# Patient Record
Sex: Male | Born: 1990 | Race: White | Hispanic: No | Marital: Married | Smoking: Never smoker
Health system: Southern US, Community
[De-identification: ages and names within clinical notes are randomized; demographics above are authoritative.]

## PROBLEM LIST (undated history)

## (undated) DIAGNOSIS — T7840XA Allergy, unspecified, initial encounter: Secondary | ICD-10-CM

## (undated) DIAGNOSIS — Z8639 Personal history of other endocrine, nutritional and metabolic disease: Secondary | ICD-10-CM

## (undated) DIAGNOSIS — T783XXA Angioneurotic edema, initial encounter: Secondary | ICD-10-CM

## (undated) HISTORY — PX: MENISCUS REPAIR: SHX5179

## (undated) HISTORY — DX: Allergy, unspecified, initial encounter: T78.40XA

## (undated) HISTORY — DX: Angioneurotic edema, initial encounter: T78.3XXA

## (undated) HISTORY — DX: Personal history of other endocrine, nutritional and metabolic disease: Z86.39

---
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
## 2013-12-27 ENCOUNTER — Encounter: Payer: Self-pay | Admitting: Family Medicine

## 2013-12-27 ENCOUNTER — Encounter (INDEPENDENT_AMBULATORY_CARE_PROVIDER_SITE_OTHER): Payer: Self-pay

## 2013-12-27 ENCOUNTER — Ambulatory Visit (HOSPITAL_COMMUNITY)
Admission: RE | Admit: 2013-12-27 | Discharge: 2013-12-27 | Disposition: A | Discharge status: Home / Self Care | Payer: BC Managed Care – PPO | Source: Ambulatory Visit | Attending: Family Medicine | Admitting: Family Medicine

## 2013-12-27 ENCOUNTER — Ambulatory Visit (INDEPENDENT_AMBULATORY_CARE_PROVIDER_SITE_OTHER): Payer: BC Managed Care – PPO

## 2013-12-27 ENCOUNTER — Ambulatory Visit (INDEPENDENT_AMBULATORY_CARE_PROVIDER_SITE_OTHER): Payer: BC Managed Care – PPO | Admitting: Family Medicine

## 2013-12-27 VITALS — BP 129/86 | HR 59 | Temp 98.4°F | Ht 70.0 in | Wt 240.0 lb

## 2013-12-27 DIAGNOSIS — R52 Pain, unspecified: Secondary | ICD-10-CM

## 2013-12-27 DIAGNOSIS — R109 Unspecified abdominal pain: Secondary | ICD-10-CM

## 2013-12-27 DIAGNOSIS — R1031 Right lower quadrant pain: Secondary | ICD-10-CM | POA: Insufficient documentation

## 2013-12-27 LAB — POCT CBC
Granulocyte percent: 53.3 %G (ref 37–80)
HEMATOCRIT: 45.1 % (ref 43.5–53.7)
Hemoglobin: 15 g/dL (ref 14.1–18.1)
Lymph, poc: 2.4 (ref 0.6–3.4)
MCH: 28.2 pg (ref 27–31.2)
MCHC: 33.2 g/dL (ref 31.8–35.4)
MCV: 85 fL (ref 80–97)
MPV: 8.4 fL (ref 0–99.8)
POC Granulocyte: 3 (ref 2–6.9)
POC LYMPH PERCENT: 42.5 %L (ref 10–50)
Platelet Count, POC: 229 10*3/uL (ref 142–424)
RBC: 5.3 M/uL (ref 4.69–6.13)
RDW, POC: 12.6 %
WBC: 5.6 10*3/uL (ref 4.6–10.2)

## 2013-12-27 LAB — POCT URINALYSIS DIPSTICK
Bilirubin, UA: NEGATIVE
GLUCOSE UA: NEGATIVE
KETONES UA: NEGATIVE
LEUKOCYTES UA: NEGATIVE
Nitrite, UA: NEGATIVE
PH UA: 5
Protein, UA: NEGATIVE
SPEC GRAV UA: 1.015
Urobilinogen, UA: NEGATIVE

## 2013-12-27 LAB — POCT UA - MICROSCOPIC ONLY
Bacteria, U Microscopic: NEGATIVE
CASTS, UR, LPF, POC: NEGATIVE
CRYSTALS, UR, HPF, POC: NEGATIVE
Mucus, UA: NEGATIVE
WBC, UR, HPF, POC: NEGATIVE
Yeast, UA: NEGATIVE

## 2013-12-27 MED ORDER — IOHEXOL 300 MG/ML  SOLN
100.0000 mL | Freq: Once | INTRAMUSCULAR | Status: AC | PRN
  Administered 2013-12-27: 100 mL via INTRAVENOUS

## 2013-12-27 MED ORDER — NAPROXEN 500 MG PO TABS: 500.0000 mg | ORAL_TABLET | Freq: Two times a day (BID) | ORAL | Status: DC

## 2013-12-27 NOTE — Patient Instructions (Signed)
Only drink clear liquids and avoid milk cheese caffeine and dairy product Avoid any heavy lifting Please do not leave x-ray until the results of the CT scan are call to [place]

## 2013-12-27 NOTE — Addendum Note (Signed)
Addended by: Zannie Cove on: 12/27/2013 02:47 PM   Modules accepted: Orders

## 2013-12-27 NOTE — Progress Notes (Signed)
Subjective:    Patient ID: Marco Reynolds, male    DOB: 08/09/1990, 23 y.o.   MRN: [id number]  HPI RUQ pain that began this morning. Denies nausea or vomiting. Has taken Pepto Bismol with no relief.         There are no active problems to display for this patient.  No outpatient encounter prescriptions on file as of 12/27/2013.    Review of Systems  Constitutional: Negative.   HENT: Negative.   Eyes: Negative.   Respiratory: Negative.   Cardiovascular: Negative.   Gastrointestinal: Positive for abdominal pain (RUQ ).  Endocrine: Negative.   Genitourinary: Negative.   Musculoskeletal: Negative.   Skin: Negative.   Allergic/Immunologic: Negative.   Neurological: Negative.   Hematological: Negative.   Psychiatric/Behavioral: Negative.        Objective:   Physical Exam  Nursing note and vitals reviewed. Constitutional: He is oriented to person, place, and time. He appears well-developed and well-nourished. No distress.  HENT:  Head: Normocephalic and atraumatic.  Eyes: Conjunctivae and EOM are normal. Pupils are equal, round, and reactive to light. Right eye exhibits no discharge. Left eye exhibits no discharge. No scleral icterus.  Neck: Normal range of motion. Neck supple. No thyromegaly present.  Cardiovascular: Normal rate, regular rhythm and normal heart sounds.  Exam reveals no gallop and no friction rub.   No murmur heard. At 72 per minute  Pulmonary/Chest: Effort normal and breath sounds normal. No respiratory distress. He has no wheezes. He has no rales. He exhibits no tenderness.  Abdominal: Soft. Bowel sounds are normal. He exhibits no mass. There is tenderness. There is guarding (there is slight guarding when pressing on the epigastrium and right upper quad). There is no rebound.  Normal bowel sounds with epigastric and right upper quadrant tenderness  Musculoskeletal: Normal range of motion. He exhibits no edema and no tenderness.  The patient noted  some pain with going supine on the table  Lymphadenopathy:    He has no cervical adenopathy.  Neurological: He is alert and oriented to person, place, and time. No cranial nerve deficit.  Skin: Skin is warm and dry. No rash noted.  Psychiatric: He has a normal mood and affect. His behavior is normal. Judgment and thought content normal.   BP 129/86  Pulse 59  Temp(Src) 98.4 F (36.9 C) (Oral)  Ht _0  (1.778 m)  Wt 240 lb (108.863 kg)  BMI 34.44 kg/m2  Results for orders placed in visit on 12/27/13  POCT CBC      Result Value Ref Range   WBC 5.6  4.6 - 10.2 K/uL   Lymph, poc 2.4  0.6 - 3.4   POC LYMPH PERCENT 42.5  10 - 50 %L   POC Granulocyte 3.0  2 - 6.9   Granulocyte percent 53.3  37 - 80 %G   RBC 5.3  4.69 - 6.13 M/uL   Hemoglobin 15.0  14.1 - 18.1 g/dL   HCT, POC 45.1  43.5 - 53.7 %   MCV 85.0  80 - 97 fL   MCH, POC 28.2  27 - 31.2 pg   MCHC 33.2  31.8 - 35.4 g/dL   RDW, POC 12.6     Platelet Count, POC 229.0  142 - 424 K/uL   MPV 8.4  0 - 99.8 fL  POCT URINALYSIS DIPSTICK      Result Value Ref Range   Color, UA yellow     Clarity, UA clear  Glucose, UA neg     Bilirubin, UA neg     Ketones, UA neg     Spec Grav, UA 1.015     Blood, UA trace     pH, UA 5.0     Protein, UA neg     Urobilinogen, UA negative     Nitrite, UA neg     Leukocytes, UA Negative    POCT UA - MICROSCOPIC ONLY      Result Value Ref Range   WBC, Ur, HPF, POC neg     RBC, urine, microscopic rare     Bacteria, U Microscopic neg     Mucus, UA neg     Epithelial cells, urine per micros rare     Crystals, Ur, HPF, POC neg     Casts, Ur, LPF, POC neg     Yeast, UA neg      These results were reviewed with the patient before he left to go get the CT scan of his abdomen       Assessment & Plan:  1. Abdominal pain, acute - POCT CBC - POCT urinalysis dipstick - POCT UA - Microscopic Only - DG Abd 1 View; Future - BMP8+EGFR - Hepatic function panel - CT Abdomen Pelvis W  Contrast; Future  Patient Instructions  Only drink clear liquids and avoid milk cheese caffeine and dairy product Avoid any heavy lifting Please do not leave x-ray until the results of the CT scan are call to [place]   Arrie Senate MD

## 2013-12-28 LAB — BMP8+EGFR
BUN/Creatinine Ratio: 11 (ref 8–19)
BUN: 10 mg/dL (ref 6–20)
CHLORIDE: 97 mmol/L (ref 97–108)
CO2: 24 mmol/L (ref 18–29)
CREATININE: 0.87 mg/dL (ref 0.76–1.27)
Calcium: 9.9 mg/dL (ref 8.7–10.2)
GFR calc non Af Amer: 121 mL/min/{1.73_m2} (ref >59)
GFR, EST AFRICAN AMERICAN: 140 mL/min/{1.73_m2} (ref >59)
GLUCOSE: 96 mg/dL (ref 65–99)
Potassium: 4.8 mmol/L (ref 3.5–5.2)
Sodium: 138 mmol/L (ref 134–144)

## 2013-12-28 LAB — HEPATIC FUNCTION PANEL
ALBUMIN: 4.9 g/dL (ref 3.5–5.5)
ALK PHOS: 69 IU/L (ref 39–117)
ALT: 25 IU/L (ref 0–44)
AST: 22 IU/L (ref 0–40)
BILIRUBIN DIRECT: 0.09 mg/dL (ref 0.00–0.40)
BILIRUBIN TOTAL: 0.4 mg/dL (ref 0.0–1.2)
TOTAL PROTEIN: 7.2 g/dL (ref 6.0–8.5)

## 2013-12-29 ENCOUNTER — Telehealth: Payer: Self-pay | Admitting: Family Medicine

## 2013-12-29 NOTE — Telephone Encounter (Signed)
He did get started on the medication and is doing well.

## 2013-12-29 NOTE — Telephone Encounter (Signed)
Message copied by Waverly Ferrari on Wed Dec 29, 2013  9:26 AM ------      Message from: Zannie Cove      Created: Tue Dec 28, 2013 12:31 PM                   ----- Message -----         From: Chipper Herb, MD         Sent: 12/28/2013   7:21 AM           To: Cameron Proud Bullins, LPN            The blood sugar, kidney function tests, and electrolytes are within normal limit      All liver function tests are within normal      Please make sure the patient got started on medication and find out how he is doing this morning. ------

## 2014-07-14 ENCOUNTER — Encounter (INDEPENDENT_AMBULATORY_CARE_PROVIDER_SITE_OTHER): Payer: Self-pay

## 2014-07-14 ENCOUNTER — Encounter: Payer: Self-pay | Admitting: Nurse Practitioner

## 2014-07-14 ENCOUNTER — Ambulatory Visit (INDEPENDENT_AMBULATORY_CARE_PROVIDER_SITE_OTHER): Payer: BLUE CROSS/BLUE SHIELD | Admitting: Nurse Practitioner

## 2014-07-14 VITALS — BP 138/87 | HR 77 | Temp 97.4°F | Ht 70.0 in | Wt 213.0 lb

## 2014-07-14 DIAGNOSIS — Z0000 Encounter for general adult medical examination without abnormal findings: Secondary | ICD-10-CM | POA: Diagnosis not present

## 2014-07-14 DIAGNOSIS — Z23 Encounter for immunization: Secondary | ICD-10-CM | POA: Diagnosis not present

## 2014-07-14 DIAGNOSIS — Z114 Encounter for screening for human immunodeficiency virus [HIV]: Secondary | ICD-10-CM | POA: Diagnosis not present

## 2014-07-14 LAB — POCT CBC
GRANULOCYTE PERCENT: 41.5 % (ref 37–80)
HEMATOCRIT: 45.8 % (ref 43.5–53.7)
HEMOGLOBIN: 14.7 g/dL (ref 14.1–18.1)
LYMPH, POC: 2.5 (ref 0.6–3.4)
MCH, POC: 28.1 pg (ref 27–31.2)
MCHC: 32.2 g/dL (ref 31.8–35.4)
MCV: 87.3 fL (ref 80–97)
MPV: 8.6 fL (ref 0–99.8)
POC GRANULOCYTE: 2.1 (ref 2–6.9)
POC LYMPH PERCENT: 50 %L (ref 10–50)
Platelet Count, POC: 243 10*3/uL (ref 142–424)
RBC: 5.25 M/uL (ref 4.69–6.13)
RDW, POC: 12.9 %
WBC: 5 10*3/uL (ref 4.6–10.2)

## 2014-07-14 NOTE — Progress Notes (Signed)
   Subjective:    Patient ID: Marco Reynolds, male    DOB: 1990/10/16, 24 y.o.   MRN: [id number]  HPI  Patient in today for complete physical exam- he is doing well today without complaints. He is currently on no meds.   Review of Systems  Constitutional: Negative.   HENT: Negative.   Respiratory: Negative.   Cardiovascular: Negative.   Gastrointestinal: Negative.   Genitourinary: Negative.   Neurological: Negative.   Psychiatric/Behavioral: Negative.   All other systems reviewed and are negative.      Objective:   Physical Exam  Constitutional: He is oriented to person, place, and time. He appears well-developed and well-nourished.  HENT:  Head: Normocephalic.  Right Ear: External ear normal.  Left Ear: External ear normal.  Nose: Nose normal.  Mouth/Throat: Oropharynx is clear and moist.  Eyes: EOM are normal. Pupils are equal, round, and reactive to light.  Neck: Normal range of motion. Neck supple. No JVD present. No thyromegaly present.  Cardiovascular: Normal rate, regular rhythm, normal heart sounds and intact distal pulses.  Exam reveals no gallop and no friction rub.   No murmur heard. Pulmonary/Chest: Effort normal and breath sounds normal. No respiratory distress. He has no wheezes. He has no rales. He exhibits no tenderness.  Abdominal: Soft. Bowel sounds are normal. He exhibits no mass. There is no tenderness.  Musculoskeletal: Normal range of motion. He exhibits no edema.  Lymphadenopathy:    He has no cervical adenopathy.  Neurological: He is alert and oriented to person, place, and time. No cranial nerve deficit.  Skin: Skin is warm and dry.  Psychiatric: He has a normal mood and affect. His behavior is normal. Judgment and thought content normal.   BP 138/87 mmHg  Pulse 77  Temp(Src) 97.4 F (36.3 C) (Oral)  Ht $R'5\' 10"'uS$  (1.778 m)  Wt 213 lb (96.616 kg)  BMI 30.56 kg/m2        Assessment & Plan:  1. Annual physical exam - POCT CBC -  CMP14+EGFR - NMR, lipoprofile  2. Screening for human immunodeficiency virus - HIV antibody  Labs pending Low fat diet and exercise RTO prn  Mary-Margaret Hassell Done, FNP

## 2014-07-14 NOTE — Patient Instructions (Signed)
Exercise to Stay Healthy Exercise helps you become and stay healthy. EXERCISE IDEAS AND TIPS Choose exercises that:  You enjoy.  Fit into your day. You do not need to exercise really hard to be healthy. You can do exercises at a slow or medium level and stay healthy. You can:  Stretch before and after working out.  Try yoga, Pilates, or tai chi.  Lift weights.  Walk fast, swim, jog, run, climb stairs, bicycle, dance, or rollerskate.  Take aerobic classes. Exercises that burn about 150 calories:  Running 1  miles in 15 minutes.  Playing volleyball for 45 to 60 minutes.  Washing and waxing a car for 45 to 60 minutes.  Playing touch football for 45 minutes.  Walking 1  miles in 35 minutes.  Pushing a stroller 1  miles in 30 minutes.  Playing basketball for 30 minutes.  Raking leaves for 30 minutes.  Bicycling 5 miles in 30 minutes.  Walking 2 miles in 30 minutes.  Dancing for 30 minutes.  Shoveling snow for 15 minutes.  Swimming laps for 20 minutes.  Walking up stairs for 15 minutes.  Bicycling 4 miles in 15 minutes.  Gardening for 30 to 45 minutes.  Jumping rope for 15 minutes.  Washing windows or floors for 45 to 60 minutes. Document Released: 05/18/2010 Document Revised: 07/08/2011 Document Reviewed: 05/18/2010 [employer] Patient Information 2015 [employer]. This information is not intended to replace advice given to you by your health care provider. Make sure you discuss any questions you have with your health care provider.  

## 2014-07-14 NOTE — Addendum Note (Signed)
Addended by: Earlene Plater on: 07/14/2014 04:50 PM   Modules accepted: Miquel Dunn

## 2014-07-14 NOTE — Addendum Note (Signed)
Addended by: Rolena Infante on: 07/14/2014 04:50 PM   Modules accepted: Orders

## 2014-07-15 ENCOUNTER — Ambulatory Visit: Payer: BLUE CROSS/BLUE SHIELD | Admitting: Nurse Practitioner

## 2014-07-18 LAB — NMR, LIPOPROFILE
Cholesterol: 191 mg/dL (ref 100–199)
HDL CHOLESTEROL BY NMR: 38 mg/dL — AB (ref >39)
HDL PARTICLE NUMBER: 26.1 umol/L — AB (ref >=30.5)
LDL Particle Number: 1583 nmol/L — ABNORMAL HIGH (ref <1000)
LDL Size: 20.9 nm (ref >20.5)
LDL-C: 127 mg/dL — AB (ref 0–99)
LP-IR Score: 69 — ABNORMAL HIGH (ref <=45)
SMALL LDL PARTICLE NUMBER: 570 nmol/L — AB (ref <=527)
TRIGLYCERIDES BY NMR: 132 mg/dL (ref 0–149)

## 2014-07-18 LAB — CMP14+EGFR

## 2014-07-18 LAB — HIV ANTIBODY (ROUTINE TESTING W REFLEX): HIV Screen 4th Generation wRfx: NONREACTIVE

## 2014-07-19 ENCOUNTER — Other Ambulatory Visit: Payer: BLUE CROSS/BLUE SHIELD

## 2014-07-19 ENCOUNTER — Telehealth: Payer: Self-pay | Admitting: Nurse Practitioner

## 2014-07-19 DIAGNOSIS — Z0000 Encounter for general adult medical examination without abnormal findings: Secondary | ICD-10-CM

## 2014-07-19 NOTE — Telephone Encounter (Signed)
Spoke with patient.

## 2014-07-19 NOTE — Progress Notes (Signed)
Lab only 

## 2014-09-04 LAB — CMP14+EGFR
ALK PHOS: 89 IU/L (ref 39–117)
ALT: 19 IU/L (ref 0–44)
AST: 20 IU/L (ref 0–40)
Albumin/Globulin Ratio: 2.2 (ref 1.1–2.5)
Albumin: 5 g/dL (ref 3.5–5.5)
BUN / CREAT RATIO: 16 (ref 8–19)
BUN: 14 mg/dL (ref 6–20)
CHLORIDE: 97 mmol/L (ref 97–108)
CO2: 25 mmol/L (ref 18–29)
CREATININE: 0.88 mg/dL (ref 0.76–1.27)
Calcium: 9.6 mg/dL (ref 8.7–10.2)
GFR, EST AFRICAN AMERICAN: 138 mL/min/{1.73_m2} (ref >59)
GFR, EST NON AFRICAN AMERICAN: 119 mL/min/{1.73_m2} (ref >59)
GLOBULIN, TOTAL: 2.3 g/dL (ref 1.5–4.5)
Glucose: 93 mg/dL (ref 65–99)
Potassium: 4.2 mmol/L (ref 3.5–5.2)
SODIUM: 139 mmol/L (ref 134–144)
Total Protein: 7.3 g/dL (ref 6.0–8.5)

## 2015-05-02 ENCOUNTER — Telehealth: Payer: Self-pay | Admitting: Nurse Practitioner

## 2015-05-02 DIAGNOSIS — J029 Acute pharyngitis, unspecified: Secondary | ICD-10-CM

## 2015-05-02 MED ORDER — AMOXICILLIN-POT CLAVULANATE 875-125 MG PO TABS: 1.0000 | ORAL_TABLET | Freq: Two times a day (BID) | ORAL | Status: DC

## 2015-05-02 NOTE — Progress Notes (Signed)
We are sorry that you are not feeling well.  Here is how we plan to help!  Based on what you have shared with me it looks like you have Pharyngitis.  pharyngitis is inflammation and infection in the Throat.  Based on your presentation I believe you most likely have Acute Bacterial Sinusitis.  This is an infection caused by bacteria and is treated with antibiotics. I have prescribed Augmentin 875mg /125mg  one tablet twice daily with food, for 7 days. You may use an oral decongestant such as Mucinex D or if you have glaucoma or high blood pressure use plain Mucinex. Saline nasal spray help and can safely be used as often as needed for congestion.  If you develop worsening sinus pain, fever or notice severe headache and vision changes, or if symptoms are not better after completion of antibiotic, please schedule an appointment with a health care provider.    Throat infections are not as easily transmitted as other respiratory infection, however we still recommend that you avoid close contact with loved ones, especially the very young and elderly.  Remember to wash your hands thoroughly throughout the day as this is the number one way to prevent the spread of infection!  Home Care:  Only take medications as instructed by your medical team.  Complete the entire course of an antibiotic.  Do not take these medications with alcohol.  A steam or ultrasonic humidifier can help congestion.  You can place a towel over your head and breathe in the steam from hot water coming from a faucet.  Avoid close contacts especially the very young and the elderly.  Cover your mouth when you cough or sneeze.  Always remember to wash your hands.  Get Help Right Away If:  You develop worsening fever or sinus pain.  You develop a severe head ache or visual changes.  Your symptoms persist after you have completed your treatment plan.  Make sure you  Understand these instructions.  Will watch your  condition.  Will get help right away if you are not doing well or get worse.  Your e-visit answers were reviewed by a board certified advanced clinical practitioner to complete your personal care plan.  Depending on the condition, your plan could have included both over the counter or prescription medications.  If there is a problem please reply  once you have received a response from your provider.  Your safety is important to [place].  If you have drug allergies check your prescription carefully.    You can use MyChart to ask questions about today's visit, request a non-urgent call back, or ask for a work or school excuse for 24 hours related to this e-Visit. If it has been greater than 24 hours you will need to follow up with your provider, or enter a new e-Visit to address those concerns.  You will get an e-mail in the next two days asking about your experience.  I hope that your e-visit has been valuable and will speed your recovery. Thank you for using e-visits.

## 2015-05-17 ENCOUNTER — Ambulatory Visit: Payer: BLUE CROSS/BLUE SHIELD

## 2015-05-19 ENCOUNTER — Encounter: Payer: Self-pay | Admitting: Nurse Practitioner

## 2015-05-19 ENCOUNTER — Ambulatory Visit (INDEPENDENT_AMBULATORY_CARE_PROVIDER_SITE_OTHER): Payer: BLUE CROSS/BLUE SHIELD | Admitting: Nurse Practitioner

## 2015-05-19 VITALS — BP 132/82 | HR 89 | Temp 98.1°F | Ht 70.0 in | Wt 226.0 lb

## 2015-05-19 DIAGNOSIS — J029 Acute pharyngitis, unspecified: Secondary | ICD-10-CM | POA: Diagnosis not present

## 2015-05-19 MED ORDER — METHYLPREDNISOLONE ACETATE 80 MG/ML IJ SUSP
80.0000 mg | Freq: Once | INTRAMUSCULAR | Status: AC
  Administered 2015-05-19: 80 mg via INTRAMUSCULAR

## 2015-05-19 NOTE — Progress Notes (Signed)
   Subjective:    Patient ID: Marco Reynolds, male    DOB: May 30, 1990, 25 y.o.   MRN: [id number]  HPI Patient in c/o sore throat , spots on tonsils and ear popping- he did an evisit on January 3rd and was given amoxicillin- he really does  Not feel any better.     Review of Systems  Constitutional: Negative.   HENT: Negative.   Respiratory: Negative.   Cardiovascular: Negative.   Genitourinary: Negative.   Neurological: Negative.   Psychiatric/Behavioral: Negative.   All other systems reviewed and are negative.      Objective:   Physical Exam  Constitutional: He is oriented to person, place, and time. He appears well-developed and well-nourished.  HENT:  Right Ear: Tympanic membrane is not erythematous. No middle ear effusion.  Left Ear: Tympanic membrane is not erythematous. A middle ear effusion is present.  Nose: Mucosal edema and rhinorrhea present. Right sinus exhibits no maxillary sinus tenderness and no frontal sinus tenderness. Left sinus exhibits no maxillary sinus tenderness and no frontal sinus tenderness.  Mouth/Throat: Uvula is midline. Posterior oropharyngeal edema present.  Neck: Normal range of motion.  Cardiovascular: Normal rate, regular rhythm and normal heart sounds.   Pulmonary/Chest: Effort normal and breath sounds normal.  Abdominal: Soft.  Neurological: He is alert and oriented to person, place, and time.  Skin: Skin is warm.  Psychiatric: He has a normal mood and affect. His behavior is normal. Judgment and thought content normal.   BP 132/82 mmHg  Pulse 89  Temp(Src) 98.1 F (36.7 C) (Oral)  Ht 5\' 10"  (1.778 m)  Wt 226 lb (102.513 kg)  BMI 32.43 kg/m2         Assessment & Plan:   1. Acute pharyngitis, unspecified etiology    Meds ordered this encounter  Medications  . methylPREDNISolone acetate (DEPO-MEDROL) injection 80 mg    Sig:    Force fluids Motrin or tylenol OTC OTC decongestant Throat lozenges if help New toothbrush  in 3 days  Mary-Margaret Hassell Done, FNP

## 2015-05-19 NOTE — Patient Instructions (Signed)

## 2015-06-13 ENCOUNTER — Encounter: Payer: Self-pay | Admitting: Nurse Practitioner

## 2015-07-21 ENCOUNTER — Encounter: Payer: Self-pay | Admitting: Nurse Practitioner

## 2015-07-21 ENCOUNTER — Ambulatory Visit (INDEPENDENT_AMBULATORY_CARE_PROVIDER_SITE_OTHER): Payer: BLUE CROSS/BLUE SHIELD | Admitting: Nurse Practitioner

## 2015-07-21 VITALS — BP 138/79 | HR 93 | Temp 98.3°F | Ht 70.0 in | Wt 217.2 lb

## 2015-07-21 DIAGNOSIS — Z0000 Encounter for general adult medical examination without abnormal findings: Secondary | ICD-10-CM | POA: Diagnosis not present

## 2015-07-21 NOTE — Progress Notes (Signed)
   Subjective:    Patient ID: Marco Reynolds, male    DOB: [birth year], 25 y.o.   MRN: [id number]  HPI Patient here for annual physical exam. Patient doing well-has no complaints to day- on no meds.    Review of Systems  Constitutional: Negative.   HENT: Negative.   Respiratory: Negative.   Cardiovascular: Negative.   Genitourinary: Negative.   Musculoskeletal: Negative.   Neurological: Negative.   Psychiatric/Behavioral: Negative.   All other systems reviewed and are negative.      Objective:   Physical Exam  Constitutional: He is oriented to person, place, and time. He appears well-developed and well-nourished.  HENT:  Head: Normocephalic.  Right Ear: External ear normal.  Left Ear: External ear normal.  Nose: Nose normal.  Mouth/Throat: Oropharynx is clear and moist.  Eyes: EOM are normal. Pupils are equal, round, and reactive to light.  Neck: Normal range of motion. Neck supple. No JVD present. No thyromegaly present.  Cardiovascular: Normal rate, regular rhythm, normal heart sounds and intact distal pulses.  Exam reveals no gallop and no friction rub.   No murmur heard. Pulmonary/Chest: Effort normal and breath sounds normal. No respiratory distress. He has no wheezes. He has no rales. He exhibits no tenderness.  Abdominal: Soft. Bowel sounds are normal. He exhibits no mass. There is no tenderness.  Genitourinary: Prostate normal and penis normal.  Musculoskeletal: Normal range of motion. He exhibits no edema.  Lymphadenopathy:    He has no cervical adenopathy.  Neurological: He is alert and oriented to person, place, and time. No cranial nerve deficit.  Skin: Skin is warm and dry.  Psychiatric: He has a normal mood and affect. His behavior is normal. Judgment and thought content normal.    BP 138/79 mmHg  Pulse 93  Temp(Src) 98.3 F (36.8 C) (Oral)  Ht '5\' 10"'$  (1.778 m)  Wt 217 lb 3.2 oz (98.521 kg)  BMI 31.16 kg/m2       Assessment & Plan:  1. Annual  physical exam - CMP14+EGFR - Lipid panel    Labs pending Health maintenance reviewed Diet and exercise encouraged Continue all meds Follow up  In 1 year   Mary-Margaret Hassell Done, FNP

## 2015-07-21 NOTE — Patient Instructions (Signed)

## 2015-07-22 LAB — LIPID PANEL
CHOL/HDL RATIO: 4.7 ratio (ref 0.0–5.0)
Cholesterol, Total: 184 mg/dL (ref 100–199)
HDL: 39 mg/dL — ABNORMAL LOW (ref >39)
LDL CALC: 125 mg/dL — AB (ref 0–99)
TRIGLYCERIDES: 101 mg/dL (ref 0–149)
VLDL Cholesterol Cal: 20 mg/dL (ref 5–40)

## 2015-07-22 LAB — CMP14+EGFR
A/G RATIO: 2.1 (ref 1.2–2.2)
ALBUMIN: 4.6 g/dL (ref 3.5–5.5)
ALT: 23 IU/L (ref 0–44)
AST: 20 IU/L (ref 0–40)
Alkaline Phosphatase: 69 IU/L (ref 39–117)
BUN / CREAT RATIO: 11 (ref 8–19)
BUN: 11 mg/dL (ref 6–20)
Bilirubin Total: 0.3 mg/dL (ref 0.0–1.2)
CALCIUM: 9.6 mg/dL (ref 8.7–10.2)
CO2: 24 mmol/L (ref 18–29)
CREATININE: 0.97 mg/dL (ref 0.76–1.27)
Chloride: 98 mmol/L (ref 96–106)
GFR calc Af Amer: 124 mL/min/{1.73_m2} (ref >59)
GFR, EST NON AFRICAN AMERICAN: 107 mL/min/{1.73_m2} (ref >59)
GLOBULIN, TOTAL: 2.2 g/dL (ref 1.5–4.5)
Glucose: 101 mg/dL — ABNORMAL HIGH (ref 65–99)
POTASSIUM: 4.4 mmol/L (ref 3.5–5.2)
SODIUM: 142 mmol/L (ref 134–144)
Total Protein: 6.8 g/dL (ref 6.0–8.5)

## 2015-09-17 ENCOUNTER — Encounter: Payer: Self-pay | Admitting: Nurse Practitioner

## 2015-09-20 ENCOUNTER — Encounter: Payer: Self-pay | Admitting: Nurse Practitioner

## 2015-09-20 ENCOUNTER — Ambulatory Visit (INDEPENDENT_AMBULATORY_CARE_PROVIDER_SITE_OTHER): Payer: BLUE CROSS/BLUE SHIELD | Admitting: Nurse Practitioner

## 2015-09-20 ENCOUNTER — Ambulatory Visit (INDEPENDENT_AMBULATORY_CARE_PROVIDER_SITE_OTHER): Payer: BLUE CROSS/BLUE SHIELD

## 2015-09-20 VITALS — BP 128/88 | HR 69 | Temp 97.4°F | Ht 70.0 in | Wt 211.0 lb

## 2015-09-20 DIAGNOSIS — S39011A Strain of muscle, fascia and tendon of abdomen, initial encounter: Secondary | ICD-10-CM | POA: Diagnosis not present

## 2015-09-20 DIAGNOSIS — D229 Melanocytic nevi, unspecified: Secondary | ICD-10-CM | POA: Diagnosis not present

## 2015-09-20 DIAGNOSIS — R1012 Left upper quadrant pain: Secondary | ICD-10-CM

## 2015-09-20 DIAGNOSIS — K5900 Constipation, unspecified: Secondary | ICD-10-CM | POA: Diagnosis not present

## 2015-09-20 NOTE — Progress Notes (Addendum)
   Subjective:    Patient ID: Marco Reynolds, male    DOB: Sep 18, 1990, 25 y.o.   MRN: [id number]  HPI Patient in today c/o abdominal pain-started 2-3 weeks ago- said that he sneezed and felt immediate pain in left upper quadrant. Was ore for a few days then almost went away but has gradually come back- describes as a dull ache that is aggravated throughout day but cannot pick a certain thing that causes it to hurt. He played golf over weekend and it did not bother him at all. Rates pain 3/10 currently.   Review of Systems  Constitutional: Negative.   HENT: Negative.   Respiratory: Negative.   Cardiovascular: Negative.   Gastrointestinal: Positive for abdominal pain. Negative for nausea, vomiting, diarrhea and constipation.  Genitourinary: Negative.   Skin: Negative.   Neurological: Negative.   Psychiatric/Behavioral: Negative.   All other systems reviewed and are negative.      Objective:   Physical Exam  Constitutional: He appears well-developed and well-nourished. No distress.  Cardiovascular: Normal rate, regular rhythm and normal heart sounds.   Pulmonary/Chest: Effort normal and breath sounds normal.  Abdominal: Soft. Bowel sounds are normal. He exhibits no distension. There is tenderness (veery mild pain on deep palpitation left upper quadrant).  Neurological: He is alert.  Skin: Skin is warm.  Psychiatric: He has a normal mood and affect. His behavior is normal. Judgment and thought content normal.   BP 128/88 mmHg  Pulse 69  Temp(Src) 97.4 F (36.3 C) (Oral)  Ht 5\' 10"  (1.778 m)  Wt 211 lb (95.709 kg)  BMI 30.28 kg/m2  KUB- large stool burden and gas in colon- mostly in ascending and tranverse colon-Preliminary reading by Ronnald Collum, FNP  [facility]        Assessment & Plan:  1. Abdominal pain, left upper quadrant - DG Abd 1 View; Future  2. Constipation, unspecified constipation type Force fluids Increase fiber in diet  3. Abdominal muscle strain, initial  encounter Rest Splint area when sneezing or coughing RTO prn  4. Numerous moles - referral to dermatology   [place], FNP

## 2015-09-20 NOTE — Addendum Note (Signed)
Addended by: Chevis Pretty on: 09/20/2015 04:28 PM   Modules accepted: Orders

## 2015-09-20 NOTE — Patient Instructions (Signed)

## 2015-09-21 ENCOUNTER — Telehealth: Payer: Self-pay | Admitting: Nurse Practitioner

## 2015-10-03 DIAGNOSIS — L814 Other melanin hyperpigmentation: Secondary | ICD-10-CM | POA: Diagnosis not present

## 2015-10-03 DIAGNOSIS — D239 Other benign neoplasm of skin, unspecified: Secondary | ICD-10-CM | POA: Diagnosis not present

## 2015-10-03 DIAGNOSIS — D229 Melanocytic nevi, unspecified: Secondary | ICD-10-CM | POA: Diagnosis not present

## 2015-10-16 DIAGNOSIS — Z008 Encounter for other general examination: Secondary | ICD-10-CM | POA: Diagnosis not present

## 2015-10-16 DIAGNOSIS — Z719 Counseling, unspecified: Secondary | ICD-10-CM | POA: Diagnosis not present

## 2016-02-08 DIAGNOSIS — Z23 Encounter for immunization: Secondary | ICD-10-CM | POA: Diagnosis not present

## 2016-02-21 DIAGNOSIS — Z008 Encounter for other general examination: Secondary | ICD-10-CM | POA: Diagnosis not present

## 2016-02-21 DIAGNOSIS — Z139 Encounter for screening, unspecified: Secondary | ICD-10-CM | POA: Diagnosis not present

## 2016-02-21 DIAGNOSIS — E782 Mixed hyperlipidemia: Secondary | ICD-10-CM | POA: Diagnosis not present

## 2016-05-14 ENCOUNTER — Encounter: Payer: Self-pay | Admitting: Nurse Practitioner

## 2016-05-29 DIAGNOSIS — E782 Mixed hyperlipidemia: Secondary | ICD-10-CM | POA: Diagnosis not present

## 2016-05-29 DIAGNOSIS — Z139 Encounter for screening, unspecified: Secondary | ICD-10-CM | POA: Diagnosis not present

## 2016-06-03 DIAGNOSIS — Z1389 Encounter for screening for other disorder: Secondary | ICD-10-CM | POA: Diagnosis not present

## 2016-06-03 DIAGNOSIS — E663 Overweight: Secondary | ICD-10-CM | POA: Diagnosis not present

## 2016-06-03 DIAGNOSIS — Z008 Encounter for other general examination: Secondary | ICD-10-CM | POA: Diagnosis not present

## 2016-06-03 DIAGNOSIS — E782 Mixed hyperlipidemia: Secondary | ICD-10-CM | POA: Diagnosis not present

## 2016-07-16 DIAGNOSIS — Z113 Encounter for screening for infections with a predominantly sexual mode of transmission: Secondary | ICD-10-CM | POA: Diagnosis not present

## 2016-07-16 DIAGNOSIS — Z114 Encounter for screening for human immunodeficiency virus [HIV]: Secondary | ICD-10-CM | POA: Diagnosis not present

## 2016-07-30 ENCOUNTER — Encounter: Payer: BLUE CROSS/BLUE SHIELD | Admitting: Nurse Practitioner

## 2016-08-12 ENCOUNTER — Ambulatory Visit: Payer: BLUE CROSS/BLUE SHIELD | Admitting: Nurse Practitioner

## 2016-08-12 DIAGNOSIS — R1011 Right upper quadrant pain: Secondary | ICD-10-CM | POA: Diagnosis not present

## 2016-08-13 ENCOUNTER — Ambulatory Visit (INDEPENDENT_AMBULATORY_CARE_PROVIDER_SITE_OTHER): Payer: BLUE CROSS/BLUE SHIELD | Admitting: Nurse Practitioner

## 2016-08-13 ENCOUNTER — Encounter: Payer: Self-pay | Admitting: Nurse Practitioner

## 2016-08-13 VITALS — BP 125/88 | HR 61 | Temp 97.1°F | Ht 70.0 in | Wt 204.0 lb

## 2016-08-13 DIAGNOSIS — Z0000 Encounter for general adult medical examination without abnormal findings: Secondary | ICD-10-CM

## 2016-08-13 LAB — CMP14+EGFR
A/G RATIO: 2 (ref 1.2–2.2)
ALBUMIN: 4.8 g/dL (ref 3.5–5.5)
ALT: 25 IU/L (ref 0–44)
AST: 25 IU/L (ref 0–40)
Alkaline Phosphatase: 69 IU/L (ref 39–117)
BILIRUBIN TOTAL: 0.4 mg/dL (ref 0.0–1.2)
BUN / CREAT RATIO: 8 — AB (ref 9–20)
BUN: 8 mg/dL (ref 6–20)
CHLORIDE: 98 mmol/L (ref 96–106)
CO2: 26 mmol/L (ref 18–29)
Calcium: 9.8 mg/dL (ref 8.7–10.2)
Creatinine, Ser: 1.01 mg/dL (ref 0.76–1.27)
GFR calc non Af Amer: 101 mL/min/{1.73_m2} (ref >59)
GFR, EST AFRICAN AMERICAN: 117 mL/min/{1.73_m2} (ref >59)
GLOBULIN, TOTAL: 2.4 g/dL (ref 1.5–4.5)
Glucose: 89 mg/dL (ref 65–99)
POTASSIUM: 4.6 mmol/L (ref 3.5–5.2)
Sodium: 142 mmol/L (ref 134–144)
TOTAL PROTEIN: 7.2 g/dL (ref 6.0–8.5)

## 2016-08-13 LAB — LIPID PANEL
Chol/HDL Ratio: 4 ratio (ref 0.0–5.0)
Cholesterol, Total: 157 mg/dL (ref 100–199)
HDL: 39 mg/dL — ABNORMAL LOW (ref >39)
LDL Calculated: 88 mg/dL (ref 0–99)
Triglycerides: 148 mg/dL (ref 0–149)
VLDL Cholesterol Cal: 30 mg/dL (ref 5–40)

## 2016-08-13 NOTE — Patient Instructions (Signed)
 Health Maintenance, Male A healthy lifestyle and preventive care is important for your health and wellness. Ask your health care provider about what schedule of regular examinations is right for you. What should I know about weight and diet?  Eat a Healthy Diet  Eat plenty of vegetables, fruits, whole grains, low-fat dairy products, and lean protein.  Do not eat a lot of foods high in solid fats, added sugars, or salt. Maintain a Healthy Weight  Regular exercise can help you achieve or maintain a healthy weight. You should:  Do at least 150 minutes of exercise each week. The exercise should increase your heart rate and make you sweat (moderate-intensity exercise).  Do strength-training exercises at least twice a week. Watch Your Levels of Cholesterol and Blood Lipids  Have your blood tested for lipids and cholesterol every 5 years starting at 28 years of age. If you are at high risk for heart disease, you should start having your blood tested when you are 28 years old. You may need to have your cholesterol levels checked more often if:  Your lipid or cholesterol levels are high.  You are older than 28 years of age.  You are at high risk for heart disease. What should I know about cancer screening? Many types of cancers can be detected early and may often be prevented. Lung Cancer  You should be screened every year for lung cancer if:  You are a current smoker who has smoked for at least 30 years.  You are a former smoker who has quit within the past 15 years.  Talk to your health care provider about your screening options, when you should start screening, and how often you should be screened. Colorectal Cancer  Routine colorectal cancer screening usually begins at 28 years of age and should be repeated every 5-10 years until you are 28 years old. You may need to be screened more often if early forms of precancerous polyps or small growths are found. Your health care provider  may recommend screening at an earlier age if you have risk factors for colon cancer.  Your health care provider may recommend using home test kits to check for hidden blood in the stool.  A small camera at the end of a tube can be used to examine your colon (sigmoidoscopy or colonoscopy). This checks for the earliest forms of colorectal cancer. Prostate and Testicular Cancer  Depending on your age and overall health, your health care provider may do certain tests to screen for prostate and testicular cancer.  Talk to your health care provider about any symptoms or concerns you have about testicular or prostate cancer. Skin Cancer  Check your skin from head to toe regularly.  Tell your health care provider about any new moles or changes in moles, especially if:  There is a change in a mole's size, shape, or color.  You have a mole that is larger than a pencil eraser.  Always use sunscreen. Apply sunscreen liberally and repeat throughout the day.  Protect yourself by wearing long sleeves, pants, a wide-brimmed hat, and sunglasses when outside. What should I know about heart disease, diabetes, and high blood pressure?  If you are 18-39 years of age, have your blood pressure checked every 3-5 years. If you are 40 years of age or older, have your blood pressure checked every year. You should have your blood pressure measured twice-once when you are at a hospital or clinic, and once when you are not at   a hospital or clinic. Record the average of the two measurements. To check your blood pressure when you are not at a hospital or clinic, you can use:  An automated blood pressure machine at a pharmacy.  A home blood pressure monitor.  Talk to your health care provider about your target blood pressure.  If you are between 45-79 years old, ask your health care provider if you should take aspirin to prevent heart disease.  Have regular diabetes screenings by checking your fasting blood sugar  level.  If you are at a normal weight and have a low risk for diabetes, have this test once every three years after the age of 45.  If you are overweight and have a high risk for diabetes, consider being tested at a younger age or more often.  A one-time screening for abdominal aortic aneurysm (AAA) by ultrasound is recommended for men aged 65-75 years who are current or former smokers. What should I know about preventing infection? Hepatitis B  If you have a higher risk for hepatitis B, you should be screened for this virus. Talk with your health care provider to find out if you are at risk for hepatitis B infection. Hepatitis C  Blood testing is recommended for:  Everyone born from 1945 through 1965.  Anyone with known risk factors for hepatitis C. Sexually Transmitted Diseases (STDs)  You should be screened each year for STDs including gonorrhea and chlamydia if:  You are sexually active and are younger than 28 years of age.  You are older than 28 years of age and your health care provider tells you that you are at risk for this type of infection.  Your sexual activity has changed since you were last screened and you are at an increased risk for chlamydia or gonorrhea. Ask your health care provider if you are at risk.  Talk with your health care provider about whether you are at high risk of being infected with HIV. Your health care provider may recommend a prescription medicine to help prevent HIV infection. What else can I do?  Schedule regular health, dental, and eye exams.  Stay current with your vaccines (immunizations).  Do not use any tobacco products, such as cigarettes, chewing tobacco, and e-cigarettes. If you need help quitting, ask your health care provider.  Limit alcohol intake to no more than 2 drinks per day. One drink equals 12 ounces of beer, 5 ounces of wine, or 1 ounces of hard liquor.  Do not use street drugs.  Do not share needles.  Ask your health  care provider for help if you need support or information about quitting drugs.  Tell your health care provider if you often feel depressed.  Tell your health care provider if you have ever been abused or do not feel safe at home. This information is not intended to replace advice given to you by your health care provider. Make sure you discuss any questions you have with your health care provider. Document Released: 10/12/2007 Document Revised: 12/13/2015 Document Reviewed: 01/17/2015 Elsevier Interactive Patient Education  2017 Elsevier Inc.  

## 2016-08-13 NOTE — Progress Notes (Signed)
   Subjective:    Patient ID: Marco Reynolds, male    DOB: July 28, 1990, 28 y.o.   MRN: [id number]  HPI  Marco Reynolds is here today for annual physical exam. He has no current chronic medical problems and is on no meds.   New complaints: Had abdominal pain yesterday. It resolved on its own- thinks that ot was just his diet the day before.     Review of Systems  Constitutional: Negative for diaphoresis.  Eyes: Negative for pain.  Respiratory: Negative for shortness of breath.   Cardiovascular: Negative for chest pain, palpitations and leg swelling.  Gastrointestinal: Negative for abdominal pain.  Endocrine: Negative for polydipsia.  Skin: Negative for rash.  Neurological: Negative for dizziness, weakness and headaches.  Hematological: Does not bruise/bleed easily.  All other systems reviewed and are negative.      Objective:   Physical Exam  Constitutional: He is oriented to person, place, and time. He appears well-developed and well-nourished.  HENT:  Head: Normocephalic.  Right Ear: External ear normal.  Left Ear: External ear normal.  Nose: Nose normal.  Mouth/Throat: Oropharynx is clear and moist.  Eyes: EOM are normal. Pupils are equal, round, and reactive to light.  Neck: Normal range of motion. Neck supple. No JVD present. No thyromegaly present.  Cardiovascular: Normal rate, regular rhythm, normal heart sounds and intact distal pulses.  Exam reveals no gallop and no friction rub.   No murmur heard. Pulmonary/Chest: Effort normal and breath sounds normal. No respiratory distress. He has no wheezes. He has no rales. He exhibits no tenderness.  Abdominal: Soft. Bowel sounds are normal. He exhibits no mass. There is no tenderness.  Genitourinary: Prostate normal and penis normal.  Musculoskeletal: Normal range of motion. He exhibits no edema.  Lymphadenopathy:    He has no cervical adenopathy.  Neurological: He is alert and oriented to person, place, and  time. No cranial nerve deficit.  Skin: Skin is warm and dry.  Psychiatric: He has a normal mood and affect. His behavior is normal. Judgment and thought content normal.   BP 125/88   Pulse 61   Temp 97.1 F (36.2 C) (Oral)   Ht _0  (1.778 m)   Wt 204 lb (92.5 kg)   BMI 29.27 kg/m       Assessment & Plan:  1. Annual physical exam exercise encouraged - CMP14+EGFR - Lipid panel  Mary-Margaret Hassell Done, FNP

## 2016-08-28 DIAGNOSIS — K219 Gastro-esophageal reflux disease without esophagitis: Secondary | ICD-10-CM | POA: Diagnosis not present

## 2016-09-04 ENCOUNTER — Encounter: Payer: Self-pay | Admitting: Nurse Practitioner

## 2016-09-05 ENCOUNTER — Other Ambulatory Visit: Payer: Self-pay | Admitting: Nurse Practitioner

## 2016-09-05 MED ORDER — ALBUTEROL SULFATE HFA 108 (90 BASE) MCG/ACT IN AERS
2.0000 | INHALATION_SPRAY | Freq: Four times a day (QID) | RESPIRATORY_TRACT | 2 refills | Status: DC | PRN
Start: 2016-09-05 — End: 2017-09-18

## 2016-09-16 ENCOUNTER — Ambulatory Visit: Payer: BLUE CROSS/BLUE SHIELD | Admitting: Nurse Practitioner

## 2016-09-19 ENCOUNTER — Ambulatory Visit: Payer: BLUE CROSS/BLUE SHIELD | Admitting: Nurse Practitioner

## 2016-09-24 ENCOUNTER — Ambulatory Visit: Payer: BLUE CROSS/BLUE SHIELD | Admitting: Nurse Practitioner

## 2016-10-18 DIAGNOSIS — D485 Neoplasm of uncertain behavior of skin: Secondary | ICD-10-CM | POA: Diagnosis not present

## 2016-10-18 DIAGNOSIS — D224 Melanocytic nevi of scalp and neck: Secondary | ICD-10-CM | POA: Diagnosis not present

## 2016-10-18 DIAGNOSIS — L814 Other melanin hyperpigmentation: Secondary | ICD-10-CM | POA: Diagnosis not present

## 2016-10-18 DIAGNOSIS — D225 Melanocytic nevi of trunk: Secondary | ICD-10-CM | POA: Diagnosis not present

## 2016-10-18 DIAGNOSIS — D2372 Other benign neoplasm of skin of left lower limb, including hip: Secondary | ICD-10-CM | POA: Diagnosis not present

## 2016-10-18 DIAGNOSIS — D235 Other benign neoplasm of skin of trunk: Secondary | ICD-10-CM | POA: Diagnosis not present

## 2016-10-18 DIAGNOSIS — D2230 Melanocytic nevi of unspecified part of face: Secondary | ICD-10-CM | POA: Diagnosis not present

## 2016-10-28 ENCOUNTER — Encounter: Payer: Self-pay | Admitting: Nurse Practitioner

## 2016-11-04 ENCOUNTER — Ambulatory Visit (INDEPENDENT_AMBULATORY_CARE_PROVIDER_SITE_OTHER): Payer: BLUE CROSS/BLUE SHIELD

## 2016-11-04 ENCOUNTER — Encounter: Payer: Self-pay | Admitting: Nurse Practitioner

## 2016-11-04 ENCOUNTER — Ambulatory Visit (INDEPENDENT_AMBULATORY_CARE_PROVIDER_SITE_OTHER): Payer: BLUE CROSS/BLUE SHIELD | Admitting: Nurse Practitioner

## 2016-11-04 VITALS — BP 139/88 | HR 73 | Temp 97.8°F | Ht 70.0 in | Wt 214.0 lb

## 2016-11-04 DIAGNOSIS — R0981 Nasal congestion: Secondary | ICD-10-CM

## 2016-11-04 DIAGNOSIS — R0602 Shortness of breath: Secondary | ICD-10-CM

## 2016-11-04 NOTE — Progress Notes (Signed)
   Subjective:    Patient ID: Marco Reynolds, male    DOB: 1991-02-19, 26 y.o.   MRN: [id number]  HPI  Patient comes in today c/o sob. He says that it mainly occurs when he is working out. Says he feels like he just cant take a deep breath. He has tried albuterol inhaler prior to exercise and that has made no difference. Lately the SOB has been random and occurring even when he is not working out. Episodes can last anywhere from 5 minutes to couple of hours. Says he finds hisself just taking real deep breathes. His girlfriend has started asking him why he is breathing so heavy. He also says that his nose stays stopped up and he cannot breathe through his nose at all. That has been coming on for several months. Use to use flonase but has not used lately because it makes no difference.   Review of Systems  Constitutional: Negative for activity change and appetite change.  HENT: Negative.   Eyes: Negative for pain.  Respiratory: Negative for shortness of breath.   Cardiovascular: Negative for chest pain, palpitations and leg swelling.  Gastrointestinal: Negative for abdominal pain.  Endocrine: Negative for polydipsia.  Genitourinary: Negative.   Skin: Negative for rash.  Neurological: Negative for dizziness, weakness and headaches.  Hematological: Does not bruise/bleed easily.  Psychiatric/Behavioral: Negative.   All other systems reviewed and are negative.      Objective:   Physical Exam  Constitutional: He is oriented to person, place, and time. He appears well-developed and well-nourished. No distress.  Cardiovascular: Normal rate and regular rhythm.   Pulmonary/Chest: Effort normal and breath sounds normal.  Neurological: He is alert and oriented to person, place, and time.  Skin: Skin is warm.  Psychiatric: He has a normal mood and affect. His behavior is normal. Judgment and thought content normal.    BP 139/88   Pulse 73   Temp 97.8 F (36.6 C) (Oral)   Ht 5\' 10"   (1.778 m)   Wt 214 lb (97.1 kg)   BMI 30.71 kg/m   Spirometry- FEV1 105% EKG- NSR    Assessment & Plan:  1. SOB (shortness of breath)  Keep diary of episodes - PR EVAL OF BRONCHOSPASM - EKG 12-Lead - DG Chest 2 View; Future  2. Nasal congestion - Ambulatory referral to ENT  [place], FNP

## 2016-11-08 ENCOUNTER — Ambulatory Visit: Payer: BLUE CROSS/BLUE SHIELD | Admitting: Nurse Practitioner

## 2016-12-16 DIAGNOSIS — J301 Allergic rhinitis due to pollen: Secondary | ICD-10-CM | POA: Insufficient documentation

## 2016-12-16 DIAGNOSIS — J302 Other seasonal allergic rhinitis: Secondary | ICD-10-CM | POA: Diagnosis not present

## 2017-01-07 DIAGNOSIS — Z139 Encounter for screening, unspecified: Secondary | ICD-10-CM | POA: Diagnosis not present

## 2017-01-07 DIAGNOSIS — E782 Mixed hyperlipidemia: Secondary | ICD-10-CM | POA: Diagnosis not present

## 2017-02-11 DIAGNOSIS — E663 Overweight: Secondary | ICD-10-CM | POA: Diagnosis not present

## 2017-02-11 DIAGNOSIS — E782 Mixed hyperlipidemia: Secondary | ICD-10-CM | POA: Diagnosis not present

## 2017-02-11 DIAGNOSIS — Z720 Tobacco use: Secondary | ICD-10-CM | POA: Diagnosis not present

## 2017-02-11 DIAGNOSIS — Z716 Tobacco abuse counseling: Secondary | ICD-10-CM | POA: Diagnosis not present

## 2017-02-11 DIAGNOSIS — Z7189 Other specified counseling: Secondary | ICD-10-CM | POA: Diagnosis not present

## 2017-02-11 DIAGNOSIS — Z719 Counseling, unspecified: Secondary | ICD-10-CM | POA: Diagnosis not present

## 2017-04-01 DIAGNOSIS — Z716 Tobacco abuse counseling: Secondary | ICD-10-CM | POA: Diagnosis not present

## 2017-04-01 DIAGNOSIS — Z720 Tobacco use: Secondary | ICD-10-CM | POA: Diagnosis not present

## 2017-05-06 DIAGNOSIS — Z716 Tobacco abuse counseling: Secondary | ICD-10-CM | POA: Diagnosis not present

## 2017-05-06 DIAGNOSIS — Z720 Tobacco use: Secondary | ICD-10-CM | POA: Diagnosis not present

## 2017-05-27 DIAGNOSIS — Z716 Tobacco abuse counseling: Secondary | ICD-10-CM | POA: Diagnosis not present

## 2017-05-27 DIAGNOSIS — Z713 Dietary counseling and surveillance: Secondary | ICD-10-CM | POA: Diagnosis not present

## 2017-05-27 DIAGNOSIS — Z720 Tobacco use: Secondary | ICD-10-CM | POA: Diagnosis not present

## 2017-06-17 DIAGNOSIS — Z716 Tobacco abuse counseling: Secondary | ICD-10-CM | POA: Diagnosis not present

## 2017-06-17 DIAGNOSIS — Z720 Tobacco use: Secondary | ICD-10-CM | POA: Diagnosis not present

## 2017-07-08 ENCOUNTER — Encounter: Payer: Self-pay | Admitting: Family Medicine

## 2017-07-08 DIAGNOSIS — Z008 Encounter for other general examination: Secondary | ICD-10-CM | POA: Diagnosis not present

## 2017-07-08 DIAGNOSIS — Z720 Tobacco use: Secondary | ICD-10-CM | POA: Diagnosis not present

## 2017-07-08 DIAGNOSIS — E782 Mixed hyperlipidemia: Secondary | ICD-10-CM | POA: Diagnosis not present

## 2017-07-08 DIAGNOSIS — Z719 Counseling, unspecified: Secondary | ICD-10-CM | POA: Diagnosis not present

## 2017-07-08 DIAGNOSIS — Z139 Encounter for screening, unspecified: Secondary | ICD-10-CM | POA: Diagnosis not present

## 2017-07-21 DIAGNOSIS — J343 Hypertrophy of nasal turbinates: Secondary | ICD-10-CM | POA: Diagnosis not present

## 2017-07-21 DIAGNOSIS — J301 Allergic rhinitis due to pollen: Secondary | ICD-10-CM | POA: Diagnosis not present

## 2017-07-21 DIAGNOSIS — J342 Deviated nasal septum: Secondary | ICD-10-CM | POA: Insufficient documentation

## 2017-07-24 DIAGNOSIS — Z720 Tobacco use: Secondary | ICD-10-CM | POA: Diagnosis not present

## 2017-07-24 DIAGNOSIS — Z716 Tobacco abuse counseling: Secondary | ICD-10-CM | POA: Diagnosis not present

## 2017-07-24 DIAGNOSIS — E782 Mixed hyperlipidemia: Secondary | ICD-10-CM | POA: Diagnosis not present

## 2017-09-05 ENCOUNTER — Encounter: Payer: Self-pay | Admitting: Nurse Practitioner

## 2017-09-09 ENCOUNTER — Ambulatory Visit: Payer: BLUE CROSS/BLUE SHIELD | Admitting: Nurse Practitioner

## 2017-09-09 ENCOUNTER — Encounter: Payer: Self-pay | Admitting: Nurse Practitioner

## 2017-09-09 VITALS — BP 132/93 | HR 79 | Temp 97.6°F | Ht 70.0 in | Wt 225.0 lb

## 2017-09-09 DIAGNOSIS — Z23 Encounter for immunization: Secondary | ICD-10-CM

## 2017-09-09 DIAGNOSIS — Z0184 Encounter for antibody response examination: Secondary | ICD-10-CM | POA: Diagnosis not present

## 2017-09-09 NOTE — Progress Notes (Signed)
   Subjective:    Patient ID: Marco Reynolds, male    DOB: [birth year], 27 y.o.   MRN: [id number]   Chief Complaint: Update immunizations   HPI Patient is getting married in September and is going to [place]. He is here today to update immunizations and to have a hepatitis b titer. 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  HENT: Negative.   Eyes: Negative for pain.  Respiratory: Negative for shortness of breath.   Cardiovascular: Negative for chest pain, palpitations and leg swelling.  Gastrointestinal: Negative for abdominal pain.  Endocrine: Negative for polydipsia.  Genitourinary: Negative.   Skin: Negative for rash.  Neurological: Negative for dizziness, weakness and headaches.  Hematological: Does not bruise/bleed easily.  Psychiatric/Behavioral: Negative.   All other systems reviewed and are negative.      Objective:   Physical Exam  Constitutional: He is oriented to person, place, and time.  HENT:  Head: Normocephalic.  Nose: Nose normal.  Mouth/Throat: Oropharynx is clear and moist.  Eyes: Pupils are equal, round, and reactive to light. EOM are normal.  Neck: Normal range of motion and phonation normal. Neck supple. No JVD present. Carotid bruit is not present. No thyroid mass and no thyromegaly present.  Cardiovascular: Normal rate and regular rhythm.    Pulmonary/Chest: Effort normal and breath sounds normal. No respiratory distress.  Abdominal: Soft. Normal appearance, normal aorta and bowel sounds are normal. There is no tenderness.  Musculoskeletal: Normal range of motion.  Lymphadenopathy:    He has no cervical adenopathy.  Neurological: He is alert and oriented to person, place, and time.  Skin: Skin is warm and dry.  Psychiatric: Judgment normal.   BP (!) 132/93   Pulse 79   Temp 97.6 F (36.4 C) (Oral)   Ht '5\' 10"'$  (1.778 m)   Wt 225 lb (102.1 kg)   BMI 32.28 kg/m         Assessment & Plan:  Marco Reynolds in today with chief complaint of Update immunizations   1. Immunity status testing Labs pending - Hepatitis B surface antibody  Immunizations today: Hep A Menigitis  Will go to health department to get  Typhoid and yellow fever  Mary-Margaret Hassell Done, FNP

## 2017-09-09 NOTE — Addendum Note (Signed)
Addended by: Rolena Infante on: 09/09/2017 12:29 PM   Modules accepted: Orders

## 2017-09-10 LAB — HEPATITIS B SURFACE ANTIBODY, QUANTITATIVE

## 2017-09-18 ENCOUNTER — Ambulatory Visit (INDEPENDENT_AMBULATORY_CARE_PROVIDER_SITE_OTHER): Payer: BLUE CROSS/BLUE SHIELD | Admitting: Nurse Practitioner

## 2017-09-18 ENCOUNTER — Encounter: Payer: Self-pay | Admitting: Nurse Practitioner

## 2017-09-18 VITALS — BP 135/83 | HR 74 | Temp 97.8°F | Ht 70.0 in | Wt 228.0 lb

## 2017-09-18 DIAGNOSIS — Z0000 Encounter for general adult medical examination without abnormal findings: Secondary | ICD-10-CM

## 2017-09-18 DIAGNOSIS — Z23 Encounter for immunization: Secondary | ICD-10-CM | POA: Diagnosis not present

## 2017-09-18 NOTE — Progress Notes (Signed)
   Subjective:    Patient ID: Marco Reynolds, male    DOB: 02/21/1991, 27 y.o.   MRN: [id number]   Chief Complaint: Annual Exam   HPI Patient comes in today for his annual physical exam that is required for work. He s doing well without complaints. He is getting married in september and traveling to [place]. He came in 2 weeks ago to discuss immuniztions for his trip. We did blood work and his Hep b did not show immunity , so we will start rapid sequence injections today. He will go to the health department to get rare immunizations that we do not have at office.   Review of Systems  Constitutional: Negative for activity change and appetite change.  HENT: Negative.   Eyes: Negative for pain.  Respiratory: Negative for shortness of breath.   Cardiovascular: Negative for chest pain, palpitations and leg swelling.  Gastrointestinal: Negative for abdominal pain.  Endocrine: Negative for polydipsia.  Genitourinary: Negative.   Skin: Negative for rash.  Neurological: Negative for dizziness, weakness and headaches.  Hematological: Does not bruise/bleed easily.  Psychiatric/Behavioral: Negative.   All other systems reviewed and are negative.      Objective:   Physical Exam  Constitutional: He is oriented to person, place, and time.  HENT:  Head: Normocephalic.  Nose: Nose normal.  Mouth/Throat: Oropharynx is clear and moist.  Eyes: Pupils are equal, round, and reactive to light. EOM are normal.  Neck: Normal range of motion and phonation normal. Neck supple. No JVD present. Carotid bruit is not present. No thyroid mass and no thyromegaly present.  Cardiovascular: Normal rate and regular rhythm.  Pulmonary/Chest: Effort normal and breath sounds normal. No respiratory distress.  Abdominal: Soft. Normal appearance, normal aorta and bowel sounds are normal. There is no tenderness.  Musculoskeletal: Normal range of motion.  Lymphadenopathy:    He has no cervical adenopathy.    Neurological: He is alert and oriented to person, place, and time.  Skin: Skin is warm and dry.  Psychiatric: Judgment normal.   BP 135/83   Pulse 74   Temp 97.8 F (36.6 C) (Oral)   Ht 5\' 10"  (1.778 m)   Wt 228 lb (103.4 kg)   BMI 32.71 kg/m         Assessment & Plan:  Marco Reynolds in today with chief complaint of Annual Exam   1. Annual physical exam Labs reviewed that were done at work Exercise encouraged Rapid sequence hep b started today RTO prn  Mary-Margaret Hassell Done, [place]

## 2017-09-18 NOTE — Patient Instructions (Signed)
Hepatitis B Vaccine, Recombinant injection What is this medicine? HEPATITIS B VACCINE (hep uh TAHY tis B VAK seen) is a vaccine. It is used to prevent an infection with the hepatitis B virus. This medicine may be used for other purposes; ask your health care provider or pharmacist if you have questions. COMMON BRAND NAME(S): Engerix-B, Recombivax HB What should I tell my health care provider before I take this medicine? They need to know if you have any of these conditions: -fever, infection -heart disease -hepatitis B infection -immune system problems -kidney disease -an unusual or allergic reaction to vaccines, yeast, other medicines, foods, dyes, or preservatives -pregnant or trying to get pregnant -breast-feeding How should I use this medicine? This vaccine is for injection into a muscle. It is given by a health care professional. A copy of Vaccine Information Statements will be given before each vaccination. Read this sheet carefully each time. The sheet may change frequently. Talk to your pediatrician regarding the use of this medicine in children. While this drug may be prescribed for children as young as newborn for selected conditions, precautions do apply. Overdosage: If you think you have taken too much of this medicine contact a poison control center or emergency room at once. NOTE: This medicine is only for you. Do not share this medicine with others. What if I miss a dose? It is important not to miss your dose. Call your doctor or health care professional if you are unable to keep an appointment. What may interact with this medicine? -medicines that suppress your immune function like adalimumab, anakinra, infliximab -medicines to treat cancer -steroid medicines like prednisone or cortisone This list may not describe all possible interactions. Give your health care provider a list of all the medicines, herbs, non-prescription drugs, or dietary supplements you use. Also tell  them if you smoke, drink alcohol, or use illegal drugs. Some items may interact with your medicine. What should I watch for while using this medicine? See your health care provider for all shots of this vaccine as directed. You must have 3 shots of this vaccine for protection from hepatitis B infection. Tell your doctor right away if you have any serious or unusual side effects after getting this vaccine. What side effects may I notice from receiving this medicine? Side effects that you should report to your doctor or health care professional as soon as possible: -allergic reactions like skin rash, itching or hives, swelling of the face, lips, or tongue -breathing problems -confused, irritated -fast, irregular heartbeat -flu-like syndrome -numb, tingling pain -seizures -unusually weak or tired Side effects that usually do not require medical attention (report to your doctor or health care professional if they continue or are bothersome): -diarrhea -fever -headache -loss of appetite -muscle pain -nausea -pain, redness, swelling, or irritation at site where injected -tiredness This list may not describe all possible side effects. Call your doctor for medical advice about side effects. You may report side effects to FDA at 1-800-FDA-1088. Where should I keep my medicine? This drug is given in a hospital or clinic and will not be stored at home. NOTE: This sheet is a summary. It may not cover all possible information. If you have questions about this medicine, talk to your doctor, pharmacist, or health care provider.  2018 Elsevier/Gold Standard (2013-08-16 13:26:01)

## 2017-09-18 NOTE — Addendum Note (Signed)
Addended by: Rolena Infante on: 09/18/2017 04:54 PM   Modules accepted: Orders

## 2017-11-03 ENCOUNTER — Ambulatory Visit: Payer: BLUE CROSS/BLUE SHIELD

## 2017-11-04 ENCOUNTER — Ambulatory Visit (INDEPENDENT_AMBULATORY_CARE_PROVIDER_SITE_OTHER): Payer: BLUE CROSS/BLUE SHIELD | Admitting: *Deleted

## 2017-11-04 DIAGNOSIS — Z23 Encounter for immunization: Secondary | ICD-10-CM | POA: Diagnosis not present

## 2017-11-04 NOTE — Progress Notes (Signed)
Pt given Hep B vaccine Tolerated well

## 2017-11-06 ENCOUNTER — Encounter: Payer: Self-pay | Admitting: Family Medicine

## 2017-11-06 ENCOUNTER — Ambulatory Visit: Payer: BLUE CROSS/BLUE SHIELD | Admitting: Family Medicine

## 2017-11-06 VITALS — BP 147/78 | HR 77 | Temp 97.2°F | Ht 70.0 in | Wt 222.8 lb

## 2017-11-06 DIAGNOSIS — S61216A Laceration without foreign body of right little finger without damage to nail, initial encounter: Secondary | ICD-10-CM

## 2017-11-06 MED ORDER — MUPIROCIN 2 % EX OINT: 1.0000 "application " | TOPICAL_OINTMENT | Freq: Two times a day (BID) | CUTANEOUS | 0 refills | Status: DC

## 2017-11-06 NOTE — Patient Instructions (Signed)
Great to meet you!  Chaange the bandage one to two times daily, apply a thin layer of mupirocin to the area. If the steri strip peels off sooner than 3 days then reapply it.   Do not submerge your hand for prolonged periods until completely healed.

## 2017-11-06 NOTE — Progress Notes (Signed)
   HPI  Patient presents today with a finger laceration.  She explains that around 830 last night he was washing a wine glass that broke and cut his right fifth digit.  His wife is an LPN and washed it out thoroughly with alcohol and bandaged it. This morning he was at work and still had a little bit of oozing so he was seen by the plant nurse.  She recommended being seen due to the continued bleeding.  Patient's bleeding is now stopped. He feels well does not feel that anything is in the wound.  He feels that the wound has been very well cleaned as they cleaned it again this morning with iodine.   PMH: Smoking status noted ROS: Per HPI  Objective: BP (!) 147/78   Pulse 77   Temp (!) 97.2 F (36.2 C) (Oral)   Ht 5\' 10"  (1.778 m)   Wt 222 lb 12.8 oz (101.1 kg)   BMI 31.97 kg/m  Gen: NAD, alert, cooperative with exam HEENT: NCAT CV: RRR, good S1/S2, no murmur Resp: CTABL, no wheezes, non-labored Ext: No edema, warm Neuro: Alert and oriented, No gross deficits Skin  R fifth digit with approx 1.5 cm thin clean lac on the lateral aspect between the MCP and PIP. No bleeding, edges are well approximated Sensation intact in 5th digit, brisk cap refill in that finger.   Assessment and plan:  #Laceration of right fifth digit Clean wound, no longer bleeding Considering its been open for over 12 hours I did not recommend repair with sutures Small amount of mupirocin placed and 2 Steri-Strips also placed.  The wound was then bandaged with Telfa and Coban Continue mupirocin 1-2 times daily, discussed supportive care Return for any signs of infection which were discussed   Meds ordered this encounter  Medications  . mupirocin ointment (BACTROBAN) 2 %    Sig: Apply 1 application topically 2 (two) times daily.    Dispense:  22 g    Refill:  [place], MD [facility] 11/06/2017, 12:17 PM

## 2017-11-18 DIAGNOSIS — Z720 Tobacco use: Secondary | ICD-10-CM | POA: Diagnosis not present

## 2017-11-18 DIAGNOSIS — Z719 Counseling, unspecified: Secondary | ICD-10-CM | POA: Diagnosis not present

## 2017-11-18 DIAGNOSIS — E782 Mixed hyperlipidemia: Secondary | ICD-10-CM | POA: Diagnosis not present

## 2017-11-18 DIAGNOSIS — Z008 Encounter for other general examination: Secondary | ICD-10-CM | POA: Diagnosis not present

## 2017-12-22 DIAGNOSIS — H5213 Myopia, bilateral: Secondary | ICD-10-CM | POA: Diagnosis not present

## 2018-01-08 ENCOUNTER — Ambulatory Visit (INDEPENDENT_AMBULATORY_CARE_PROVIDER_SITE_OTHER): Payer: BLUE CROSS/BLUE SHIELD | Admitting: *Deleted

## 2018-01-08 DIAGNOSIS — Z23 Encounter for immunization: Secondary | ICD-10-CM | POA: Diagnosis not present

## 2018-01-08 NOTE — Progress Notes (Signed)
Pt here for Hep B vaccine On accelerated schedule due to traveling to [place] Vaccine given Pt tolerated well

## 2018-01-27 DIAGNOSIS — Z23 Encounter for immunization: Secondary | ICD-10-CM | POA: Diagnosis not present

## 2018-03-17 DIAGNOSIS — R7301 Impaired fasting glucose: Secondary | ICD-10-CM | POA: Diagnosis not present

## 2018-03-17 DIAGNOSIS — E782 Mixed hyperlipidemia: Secondary | ICD-10-CM | POA: Diagnosis not present

## 2018-03-17 DIAGNOSIS — Z719 Counseling, unspecified: Secondary | ICD-10-CM | POA: Diagnosis not present

## 2018-04-01 ENCOUNTER — Ambulatory Visit: Payer: BLUE CROSS/BLUE SHIELD | Admitting: Family Medicine

## 2018-04-01 ENCOUNTER — Encounter: Payer: Self-pay | Admitting: Family Medicine

## 2018-04-01 VITALS — BP 150/89 | HR 94 | Temp 97.5°F | Ht 70.0 in | Wt 228.0 lb

## 2018-04-01 DIAGNOSIS — R030 Elevated blood-pressure reading, without diagnosis of hypertension: Secondary | ICD-10-CM

## 2018-04-01 DIAGNOSIS — Z13220 Encounter for screening for lipoid disorders: Secondary | ICD-10-CM | POA: Diagnosis not present

## 2018-04-01 DIAGNOSIS — E669 Obesity, unspecified: Secondary | ICD-10-CM | POA: Diagnosis not present

## 2018-04-01 NOTE — Progress Notes (Signed)
BP (!) 150/89   Pulse 94   Temp (!) 97.5 F (36.4 C) (Oral)   Ht '5\' 10"'$  (1.778 m)   Wt 228 lb (103.4 kg)   BMI 32.71 kg/m    Subjective:    Patient ID: Marco Reynolds, male    DOB: [birth year], 27 y.o.   MRN: [id number]  HPI: Marco Reynolds is a 27 y.o. male presenting on 04/01/2018 for Establish Care (Was MMM patient. patient states he has no complaints today just wants to establish care)   HPI Elevated blood pressure Patient comes in to establish care with me as a primary care physician and to discuss blood pressure.  His blood pressure today is 150/89 and he says it runs in the 130s over 80s most of the time.  He has been having it checked frequently at work through a [employer]er, [employer] and also has checked it at home some as his wife works in the Physicist, medical.  He says this is the highest that is been or he has seen.  He is also accompanied today because he wants to do some blood work.  Patient says that he does have an extensive family history and that his dad had blood pressure and cholesterol issues in his 100s and his grandfather had multiple myocardial infarctions at a young age.  He is just concerned about overall health but does want to try and avoid medications if at all possible.  He says he has switched his diet recently and eating less processed foods.  Relevant past medical, surgical, family and social history reviewed and updated as indicated. Interim medical history since our last visit reviewed. Allergies and medications reviewed and updated.  Review of Systems  Constitutional: Negative for chills and fever.  Eyes: Negative for visual disturbance.  Respiratory: Negative for shortness of breath and wheezing.   Cardiovascular: Negative for chest pain, palpitations and leg swelling.  Musculoskeletal: Negative for back pain and gait problem.  Skin: Negative for rash.  Neurological: Negative for dizziness, weakness, light-headedness and numbness.  All other  systems reviewed and are negative.   Per HPI unless specifically indicated above   Allergies as of 04/01/2018   No Known Allergies     Medication List    as of 04/01/2018  9:05 AM   You have not been prescribed any medications.        Objective:    BP (!) 150/89   Pulse 94   Temp (!) 97.5 F (36.4 C) (Oral)   Ht '5\' 10"'$  (1.778 m)   Wt 228 lb (103.4 kg)   BMI 32.71 kg/m   Wt Readings from Last 3 Encounters:  04/01/18 228 lb (103.4 kg)  11/06/17 222 lb 12.8 oz (101.1 kg)  09/18/17 228 lb (103.4 kg)    Physical Exam  Constitutional: He is oriented to person, place, and time. He appears well-developed and well-nourished. No distress.  Eyes: Conjunctivae are normal. No scleral icterus.  Neck: Neck supple. No thyromegaly present.  Cardiovascular: Normal rate, regular rhythm, normal heart sounds and intact distal pulses.  No murmur heard. Pulmonary/Chest: Effort normal and breath sounds normal. No respiratory distress. He has no wheezes.  Musculoskeletal: Normal range of motion. He exhibits no edema.  Lymphadenopathy:    He has no cervical adenopathy.  Neurological: He is alert and oriented to person, place, and time. Coordination normal.  Skin: Skin is warm and dry. No rash noted. He is not diaphoretic.  Psychiatric: He has a normal mood  and affect. His behavior is normal.  Nursing note and vitals reviewed.       Assessment & Plan:   Problem List Items Addressed This Visit      Other   Obesity (BMI 30.0-34.9)   Relevant Orders   CBC with Differential/Platelet    Other Visit Diagnoses    Elevated blood pressure reading    -  Primary   Relevant Orders   CBC with Differential/Platelet   CMP14+EGFR   Lipid screening       Relevant Orders   CBC with Differential/Platelet   Lipid panel    Patient will keep blood pressure logs and return in 6 months and we will see about possible medication then  Follow up plan: Return in about 6 months (around 10/01/2018), or  if symptoms worsen or fail to improve, for Physical and blood pressure recheck.  Counseling provided for all of the vaccine components Orders Placed This Encounter  Procedures  . CBC with Differential/Platelet  . CMP14+EGFR  . Lipid panel    Caryl Pina, MD [facility] 04/01/2018, 9:05 AM

## 2018-04-02 LAB — CBC WITH DIFFERENTIAL/PLATELET
BASOS: 0 %
Basophils Absolute: 0 10*3/uL (ref 0.0–0.2)
EOS (ABSOLUTE): 0.1 10*3/uL (ref 0.0–0.4)
EOS: 1 %
HEMATOCRIT: 45.5 % (ref 37.5–51.0)
HEMOGLOBIN: 15.4 g/dL (ref 13.0–17.7)
IMMATURE GRANULOCYTES: 0 %
Immature Grans (Abs): 0 10*3/uL (ref 0.0–0.1)
LYMPHS ABS: 1.7 10*3/uL (ref 0.7–3.1)
Lymphs: 23 %
MCH: 28.8 pg (ref 26.6–33.0)
MCHC: 33.8 g/dL (ref 31.5–35.7)
MCV: 85 fL (ref 79–97)
MONOCYTES: 8 %
Monocytes Absolute: 0.6 10*3/uL (ref 0.1–0.9)
Neutrophils Absolute: 4.9 10*3/uL (ref 1.4–7.0)
Neutrophils: 68 %
Platelets: 252 10*3/uL (ref 150–450)
RBC: 5.35 x10E6/uL (ref 4.14–5.80)
RDW: 12.7 % (ref 12.3–15.4)
WBC: 7.2 10*3/uL (ref 3.4–10.8)

## 2018-04-02 LAB — CMP14+EGFR
ALBUMIN: 5.1 g/dL (ref 3.5–5.5)
ALT: 31 IU/L (ref 0–44)
AST: 27 IU/L (ref 0–40)
Albumin/Globulin Ratio: 2 (ref 1.2–2.2)
Alkaline Phosphatase: 69 IU/L (ref 39–117)
BUN / CREAT RATIO: 12 (ref 9–20)
BUN: 13 mg/dL (ref 6–20)
Bilirubin Total: 0.6 mg/dL (ref 0.0–1.2)
CALCIUM: 10.4 mg/dL — AB (ref 8.7–10.2)
CO2: 22 mmol/L (ref 20–29)
CREATININE: 1.13 mg/dL (ref 0.76–1.27)
Chloride: 98 mmol/L (ref 96–106)
GFR calc Af Amer: 101 mL/min/{1.73_m2} (ref >59)
GFR, EST NON AFRICAN AMERICAN: 87 mL/min/{1.73_m2} (ref >59)
Globulin, Total: 2.5 g/dL (ref 1.5–4.5)
Glucose: 101 mg/dL — ABNORMAL HIGH (ref 65–99)
Potassium: 4.1 mmol/L (ref 3.5–5.2)
SODIUM: 138 mmol/L (ref 134–144)
Total Protein: 7.6 g/dL (ref 6.0–8.5)

## 2018-04-02 LAB — LIPID PANEL
CHOL/HDL RATIO: 5.4 ratio — AB (ref 0.0–5.0)
Cholesterol, Total: 238 mg/dL — ABNORMAL HIGH (ref 100–199)
HDL: 44 mg/dL (ref >39)
LDL CALC: 159 mg/dL — AB (ref 0–99)
Triglycerides: 176 mg/dL — ABNORMAL HIGH (ref 0–149)
VLDL Cholesterol Cal: 35 mg/dL (ref 5–40)

## 2018-05-26 DIAGNOSIS — E782 Mixed hyperlipidemia: Secondary | ICD-10-CM | POA: Diagnosis not present

## 2018-05-26 DIAGNOSIS — Z139 Encounter for screening, unspecified: Secondary | ICD-10-CM | POA: Diagnosis not present

## 2018-05-26 DIAGNOSIS — E559 Vitamin D deficiency, unspecified: Secondary | ICD-10-CM | POA: Diagnosis not present

## 2018-05-26 DIAGNOSIS — R7301 Impaired fasting glucose: Secondary | ICD-10-CM | POA: Diagnosis not present

## 2018-05-26 DIAGNOSIS — Z0130 Encounter for examination of blood pressure without abnormal findings: Secondary | ICD-10-CM | POA: Diagnosis not present

## 2018-06-02 DIAGNOSIS — R7301 Impaired fasting glucose: Secondary | ICD-10-CM | POA: Diagnosis not present

## 2018-06-02 DIAGNOSIS — Z008 Encounter for other general examination: Secondary | ICD-10-CM | POA: Diagnosis not present

## 2018-06-02 DIAGNOSIS — E782 Mixed hyperlipidemia: Secondary | ICD-10-CM | POA: Diagnosis not present

## 2018-06-02 DIAGNOSIS — Z1389 Encounter for screening for other disorder: Secondary | ICD-10-CM | POA: Diagnosis not present

## 2018-06-19 DIAGNOSIS — M25572 Pain in left ankle and joints of left foot: Secondary | ICD-10-CM | POA: Diagnosis not present

## 2018-06-19 DIAGNOSIS — M76822 Posterior tibial tendinitis, left leg: Secondary | ICD-10-CM | POA: Diagnosis not present

## 2018-06-29 ENCOUNTER — Encounter: Payer: Self-pay | Admitting: Family Medicine

## 2018-06-29 ENCOUNTER — Ambulatory Visit: Payer: BLUE CROSS/BLUE SHIELD | Admitting: Family Medicine

## 2018-06-29 VITALS — BP 145/89 | HR 83 | Temp 97.6°F | Ht 70.0 in | Wt 218.4 lb

## 2018-06-29 DIAGNOSIS — M1990 Unspecified osteoarthritis, unspecified site: Secondary | ICD-10-CM | POA: Diagnosis not present

## 2018-06-29 MED ORDER — COLCHICINE 0.6 MG PO TABS: 0.6000 mg | ORAL_TABLET | Freq: Every day | ORAL | 3 refills | Status: DC

## 2018-06-29 NOTE — Progress Notes (Signed)
BP (!) 145/89   Pulse 83   Temp 97.6 F (36.4 C) (Oral)   Ht 5\' 10"  (1.778 m)   Wt 218 lb 6.4 oz (99.1 kg)   BMI 31.34 kg/m    Subjective:    Patient ID: Marco Reynolds, male    DOB: 06-Mar-1991, 28 y.o.   MRN: [id number]  HPI: Marco Reynolds is a 28 y.o. male presenting on 06/29/2018 for Joint Swelling (Left- x 2 months but has gotten worse x 1 month- gout?)   HPI Joint swelling and pain Patient comes in complaining of joint swelling and pain that is been bothering him in his left ankle that is been off and on going on over the past 2 months.  He says it was a little more red and swollen initially and has come down some.  He has been using some Tylenol for it and he did feel like it was improving but then it has gotten worse again over the past month.  He has never had this before but he was told by others that he may be having gout.  We will he said he did go see an orthopedic a month and half ago and they did an x-ray and said he had some kind of arthritis in the ankle itself  Relevant past medical, surgical, family and social history reviewed and updated as indicated. Interim medical history since our last visit reviewed. Allergies and medications reviewed and updated.  Review of Systems  Constitutional: Negative for chills and fever.  Respiratory: Negative for shortness of breath and wheezing.   Cardiovascular: Negative for chest pain and leg swelling.  Musculoskeletal: Positive for arthralgias and joint swelling. Negative for back pain, gait problem and myalgias.  Skin: Negative for color change, rash and wound.  All other systems reviewed and are negative.   Per HPI unless specifically indicated above   Allergies as of 06/29/2018   No Known Allergies     Medication List       Accurate as of June 29, 2018 12:04 PM. Always use your most recent med list.        colchicine 0.6 MG tablet Take 1 tablet (0.6 mg total) by mouth daily.          Objective:     BP (!) 145/89   Pulse 83   Temp 97.6 F (36.4 C) (Oral)   Ht 5\' 10"  (1.778 m)   Wt 218 lb 6.4 oz (99.1 kg)   BMI 31.34 kg/m   Wt Readings from Last 3 Encounters:  06/29/18 218 lb 6.4 oz (99.1 kg)  04/01/18 228 lb (103.4 kg)  11/06/17 222 lb 12.8 oz (101.1 kg)    Physical Exam Vitals signs and nursing note reviewed.  Constitutional:      General: He is not in acute distress.    Appearance: He is well-developed. He is not diaphoretic.  Neck:     Thyroid: No thyromegaly.  Musculoskeletal: Normal range of motion.     Left ankle: He exhibits swelling (Mild swelling compared to the other ankle). He exhibits normal range of motion, no deformity and normal pulse. Tenderness.       Feet:  Skin:    General: Skin is warm and dry.     Findings: No erythema or rash.  Neurological:     Mental Status: He is alert.     Coordination: Coordination normal.     Gait: Gait normal.  Psychiatric:  Behavior: Behavior normal.         Assessment & Plan:   Problem List Items Addressed This Visit    None    Visit Diagnoses    Inflammatory arthritis    -  Primary   Strong family history of gout   Relevant Orders   Arthritis Panel (Completed)   Uric acid      It looks like is some kind of inflammatory arthritis, will test an arthritis panel and test uric acid and will give patient colchicine to see if it helps and will proceed from there.  Follow up plan: Return if symptoms worsen or fail to improve.  Counseling provided for all of the vaccine components Orders Placed This Encounter  Procedures  . Arthritis Panel  . Uric acid    Caryl Pina, MD [facility] 06/29/2018, 12:04 PM

## 2018-06-30 ENCOUNTER — Telehealth: Payer: Self-pay

## 2018-06-30 LAB — ARTHRITIS PANEL
BASOS ABS: 0 10*3/uL (ref 0.0–0.2)
Basos: 0 %
EOS (ABSOLUTE): 0 10*3/uL (ref 0.0–0.4)
Eos: 0 %
HEMATOCRIT: 45.3 % (ref 37.5–51.0)
HEMOGLOBIN: 15 g/dL (ref 13.0–17.7)
Immature Grans (Abs): 0 10*3/uL (ref 0.0–0.1)
Immature Granulocytes: 0 %
LYMPHS ABS: 1.8 10*3/uL (ref 0.7–3.1)
Lymphs: 27 %
MCH: 28.4 pg (ref 26.6–33.0)
MCHC: 33.1 g/dL (ref 31.5–35.7)
MCV: 86 fL (ref 79–97)
MONOCYTES: 5 %
MONOS ABS: 0.4 10*3/uL (ref 0.1–0.9)
NEUTROS ABS: 4.5 10*3/uL (ref 1.4–7.0)
Neutrophils: 68 %
PLATELETS: 293 10*3/uL (ref 150–450)
RBC: 5.28 x10E6/uL (ref 4.14–5.80)
RDW: 12.4 % (ref 11.6–15.4)
Rhuematoid fact SerPl-aCnc: <10 IU/mL (ref 0.0–13.9)
SED RATE: 15 mm/h (ref 0–15)
Uric Acid: 6.7 mg/dL (ref 3.7–8.6)
WBC: 6.7 10*3/uL (ref 3.4–10.8)

## 2018-06-30 MED ORDER — MITIGARE 0.6 MG PO CAPS: 0.6000 mg | ORAL_CAPSULE | Freq: Every day | ORAL | 1 refills | Status: DC | PRN

## 2018-06-30 NOTE — Telephone Encounter (Signed)
Prior auth came back for Colchicine that not a covered benefit  Excluded from coverage

## 2018-07-10 DIAGNOSIS — M25572 Pain in left ankle and joints of left foot: Secondary | ICD-10-CM | POA: Diagnosis not present

## 2018-07-15 ENCOUNTER — Other Ambulatory Visit: Payer: Self-pay | Admitting: Orthopaedic Surgery

## 2018-07-15 DIAGNOSIS — M25572 Pain in left ankle and joints of left foot: Secondary | ICD-10-CM

## 2018-07-17 ENCOUNTER — Encounter: Payer: Self-pay | Admitting: Sports Medicine

## 2018-07-17 ENCOUNTER — Ambulatory Visit (INDEPENDENT_AMBULATORY_CARE_PROVIDER_SITE_OTHER): Payer: BLUE CROSS/BLUE SHIELD | Admitting: Sports Medicine

## 2018-07-17 ENCOUNTER — Other Ambulatory Visit: Payer: Self-pay

## 2018-07-17 DIAGNOSIS — M25572 Pain in left ankle and joints of left foot: Secondary | ICD-10-CM

## 2018-07-17 DIAGNOSIS — G8929 Other chronic pain: Secondary | ICD-10-CM

## 2018-07-17 DIAGNOSIS — M25461 Effusion, right knee: Secondary | ICD-10-CM

## 2018-07-17 DIAGNOSIS — M25561 Pain in right knee: Secondary | ICD-10-CM

## 2018-07-17 HISTORY — DX: Effusion, right knee: M25.461

## 2018-07-17 MED ORDER — INDOMETHACIN 50 MG PO CAPS: 50.0000 mg | ORAL_CAPSULE | Freq: Two times a day (BID) | ORAL | 0 refills | Status: DC

## 2018-07-17 NOTE — Progress Notes (Signed)
Subjective:    CC: Right knee swelling, left ankle pain  HPI:  This is a pleasant 28 year old male, for over a month now has had pain and swelling in his right knee, it initially started out fairly hot and red.  He has had several evaluations, but no diagnosis yet.  Pain is severe, persistent, localized without radiation.  He is post partial meniscectomy, right knee many years ago while playing baseball.  Not really having any mechanical symptoms and no trauma.  No constitutional symptoms.  In addition for the past several months he has had pain in the left ankle, medial aspect with radiation from the navicular prominence up the medial malleolus.  He was placed in a boot for 2 weeks without any improvement, this actually made the pain worse.  Symptoms are moderate, persistent, localized without radiation, no trauma, no mechanical symptoms.  I reviewed the past medical history, family history, social history, surgical history, and allergies today and no changes were needed.  Please see the problem list section below in epic for further details.  Past Medical History: No past medical history on file. Past Surgical History: Past Surgical History:  Procedure Laterality Date  . MENISCUS REPAIR Right    Social History: Social History   Socioeconomic History  . Marital status: Single    Spouse name: Not on file  . Number of children: Not on file  . Years of education: Not on file  . Highest education level: Not on file  Occupational History  . Not on file  Social Needs  . Financial resource strain: Not on file  . Food insecurity:    Worry: Not on file    Inability: Not on file  . Transportation needs:    Medical: Not on file    Non-medical: Not on file  Tobacco Use  . Smoking status: Never Smoker  . Smokeless tobacco: Former Network engineer and Sexual Activity  . Alcohol use: Yes    Comment: occ  . Drug use: No  . Sexual activity: Not on file  Lifestyle  . Physical activity:     Days per week: Not on file    Minutes per session: Not on file  . Stress: Not on file  Relationships  . Social connections:    Talks on phone: Not on file    Gets together: Not on file    Attends religious service: Not on file    Active member of club or organization: Not on file    Attends meetings of clubs or organizations: Not on file    Relationship status: Not on file  Other Topics Concern  . Not on file  Social History Narrative  . Not on file   Family History: Family History  Problem Relation Age of Onset  . Healthy Mother   . Healthy Father   . Healthy Brother   . Healthy Brother    Allergies: No Known Allergies Medications: See med rec.  Review of Systems: No headache, visual changes, nausea, vomiting, diarrhea, constipation, dizziness, abdominal pain, skin rash, fevers, chills, night sweats, swollen lymph nodes, weight loss, chest pain, body aches, joint swelling, muscle aches, shortness of breath, mood changes, visual or auditory hallucinations.  Objective:    General: Well Developed, well nourished, and in no acute distress.  Neuro: Alert and oriented x3, extra-ocular muscles intact, sensation grossly intact.  HEENT: Normocephalic, atraumatic, pupils equal round reactive to light, neck supple, no masses, no lymphadenopathy, thyroid nonpalpable.  Skin: Warm and dry,  no rashes noted.  Cardiac: Regular rate and rhythm, no murmurs rubs or gallops.  Respiratory: Clear to auscultation bilaterally. Not using accessory muscles, speaking in full sentences.  Abdominal: Soft, nontender, nondistended, positive bowel sounds, no masses, no organomegaly.  Left ankle: No visible erythema or swelling. Range of motion is full in all directions. Strength is 5/5 in all directions. Stable lateral and medial ligaments; squeeze test and kleiger test unremarkable; Talar dome nontender; No pain at base of 5th MT; No tenderness over cuboid; Tender to palpation over the navicular  prominence. Minimal tenderness posterior and distal to the medial malleolus with reproduction of pain with resisted inversion of the ankle. No sign of peroneal tendon subluxations; Negative tarsal tunnel tinel's Able to walk 4 steps. Right knee: Visibly swollen, palpable fluid wave with an effusion. Severe tenderness at the medial joint line. ROM normal in flexion and extension and lower leg rotation. Ligaments with solid consistent endpoints including ACL, PCL, LCL, MCL. Negative Mcmurray's and provocative meniscal tests. Non painful patellar compression. Patellar and quadriceps tendons unremarkable. Hamstring and quadriceps strength is normal. Aggressive exam however was limited by patient's pain.  Procedure: Real-time Ultrasound Guided aspiration/injection of right knee Device: GE Logiq E  Verbal informed consent obtained.  Time-out conducted.  Noted no overlying erythema, induration, or other signs of local infection.  Skin prepped in a sterile fashion.  Local anesthesia: Topical Ethyl chloride.  With sterile technique and under real time ultrasound guidance:  Using an 18-gauge needle I aspirated approximately 33 cc of cloudy, straw-colored fluid, syringe switched and 1 cc Kenalog 40, 2 cc lidocaine, 2 cc bupivacaine injected easily Completed without difficulty  Pain immediately resolved suggesting accurate placement of the medication.  Advised to call if fevers/chills, erythema, induration, drainage, or persistent bleeding.  Images permanently stored and available for review in the ultrasound unit.  Impression: Technically successful ultrasound guided injection.  The knee was then strapped with a compressive dressing.  Impression and Recommendations:    The patient was counselled, risk factors were discussed, anticipatory guidance given.  Left ankle pain Pain on the medial ankle consistent with navicular stress injury as well as tibialis posterior tendinitis. Adding a  referral for custom orthotics, he does have an ankle MRI coming up. Rehab exercises given. Return in 1 month for this.  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 Uric acid level for the most part normal however this was checked during a flare, it can be falsely low during there. Rheumatoid factor, sed rate were both normal on recent labs. Crystal analysis. Knee was injected as well. Strapping dressing. Starting indomethacin, return in 1 month, we will check serum uric acid levels and start allopurinol if uric acid levels are elevated, goal is less than 6.   ___________________________________________ [name]. [name], M.D., ABFM., CAQSM. Primary Care and Sports Medicine [facility]  Adjunct Professor of [employer]

## 2018-07-17 NOTE — Assessment & Plan Note (Signed)
Pain on the medial ankle consistent with navicular stress injury as well as tibialis posterior tendinitis. Adding a referral for custom orthotics, he does have an ankle MRI coming up. Rehab exercises given. Return in 1 month for this.

## 2018-07-17 NOTE — Assessment & Plan Note (Signed)
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 Uric acid level for the most part normal however this was checked during a flare, it can be falsely low during there. Rheumatoid factor, sed rate were both normal on recent labs. Crystal analysis. Knee was injected as well. Strapping dressing. Starting indomethacin, return in 1 month, we will check serum uric acid levels and start allopurinol if uric acid levels are elevated, goal is less than 6.

## 2018-07-18 LAB — SYNOVIAL FLUID, CRYSTAL

## 2018-07-19 ENCOUNTER — Ambulatory Visit (INDEPENDENT_AMBULATORY_CARE_PROVIDER_SITE_OTHER): Payer: BLUE CROSS/BLUE SHIELD

## 2018-07-19 ENCOUNTER — Other Ambulatory Visit: Payer: Self-pay

## 2018-07-19 DIAGNOSIS — M25561 Pain in right knee: Secondary | ICD-10-CM | POA: Diagnosis not present

## 2018-07-19 DIAGNOSIS — M25572 Pain in left ankle and joints of left foot: Secondary | ICD-10-CM | POA: Diagnosis not present

## 2018-07-19 DIAGNOSIS — M25461 Effusion, right knee: Secondary | ICD-10-CM

## 2018-07-29 DIAGNOSIS — M25572 Pain in left ankle and joints of left foot: Secondary | ICD-10-CM | POA: Diagnosis not present

## 2018-08-19 DIAGNOSIS — M25572 Pain in left ankle and joints of left foot: Secondary | ICD-10-CM | POA: Diagnosis not present

## 2018-09-16 DIAGNOSIS — M25572 Pain in left ankle and joints of left foot: Secondary | ICD-10-CM | POA: Diagnosis not present

## 2018-09-17 ENCOUNTER — Ambulatory Visit (INDEPENDENT_AMBULATORY_CARE_PROVIDER_SITE_OTHER): Payer: BLUE CROSS/BLUE SHIELD

## 2018-09-17 ENCOUNTER — Other Ambulatory Visit: Payer: Self-pay | Admitting: Orthopaedic Surgery

## 2018-09-17 ENCOUNTER — Other Ambulatory Visit: Payer: Self-pay

## 2018-09-17 DIAGNOSIS — M19072 Primary osteoarthritis, left ankle and foot: Secondary | ICD-10-CM | POA: Diagnosis not present

## 2018-09-17 DIAGNOSIS — S92255D Nondisplaced fracture of navicular [scaphoid] of left foot, subsequent encounter for fracture with routine healing: Secondary | ICD-10-CM | POA: Diagnosis not present

## 2018-09-22 ENCOUNTER — Encounter: Payer: BLUE CROSS/BLUE SHIELD | Admitting: Family Medicine

## 2018-09-23 DIAGNOSIS — M25572 Pain in left ankle and joints of left foot: Secondary | ICD-10-CM | POA: Diagnosis not present

## 2018-09-24 ENCOUNTER — Encounter: Payer: BLUE CROSS/BLUE SHIELD | Admitting: Family Medicine

## 2018-09-29 DIAGNOSIS — M25461 Effusion, right knee: Secondary | ICD-10-CM | POA: Diagnosis not present

## 2018-09-29 DIAGNOSIS — M25572 Pain in left ankle and joints of left foot: Secondary | ICD-10-CM | POA: Diagnosis not present

## 2018-09-29 DIAGNOSIS — M1120 Other chondrocalcinosis, unspecified site: Secondary | ICD-10-CM | POA: Diagnosis not present

## 2018-10-23 ENCOUNTER — Other Ambulatory Visit: Payer: Self-pay

## 2018-10-26 ENCOUNTER — Encounter: Payer: Self-pay | Admitting: Family Medicine

## 2018-10-26 ENCOUNTER — Other Ambulatory Visit: Payer: Self-pay

## 2018-10-26 ENCOUNTER — Ambulatory Visit (INDEPENDENT_AMBULATORY_CARE_PROVIDER_SITE_OTHER): Payer: BC Managed Care – PPO | Admitting: Family Medicine

## 2018-10-26 VITALS — BP 145/103 | HR 87 | Temp 97.8°F | Ht 70.0 in | Wt 223.4 lb

## 2018-10-26 DIAGNOSIS — E782 Mixed hyperlipidemia: Secondary | ICD-10-CM | POA: Diagnosis not present

## 2018-10-26 DIAGNOSIS — Z0001 Encounter for general adult medical examination with abnormal findings: Secondary | ICD-10-CM

## 2018-10-26 DIAGNOSIS — Z0000 Encounter for general adult medical examination without abnormal findings: Secondary | ICD-10-CM

## 2018-10-26 DIAGNOSIS — I10 Essential (primary) hypertension: Secondary | ICD-10-CM

## 2018-10-26 NOTE — Progress Notes (Signed)
BP (!) 145/103   Pulse 87   Temp 97.8 F (36.6 C) (Oral)   Ht 5\' 10"  (1.778 m)   Wt 223 lb 6.4 oz (101.3 kg)   BMI 32.05 kg/m    Subjective:   Patient ID: Marco Reynolds, male    DOB: 02/14/1991, 28 y.o.   MRN: [id number]  HPI: Marco Reynolds is a 28 y.o. male presenting on 10/26/2018 for Annual Exam   HPI Adult well exam and physical and recheck of chronic medical issues Patient has been fighting off and on cholesterol and blood pressure.  His blood pressure today is 145/103 but when he had a checked at home it typically runs 127/77 up to 135/85.  He will continue to check at home and send [place] some more numbers.  He denies any chest pain or palpitations or headaches.  He has been fighting some kind of inflammatory arthritis and has a follow-up appointment with rheumatology for possible diagnosis.  Relevant past medical, surgical, family and social history reviewed and updated as indicated. Interim medical history since our last visit reviewed. Allergies and medications reviewed and updated.  Review of Systems  Constitutional: Negative for chills and fever.  HENT: Negative for ear pain and tinnitus.   Eyes: Negative for pain.  Respiratory: Negative for cough, shortness of breath and wheezing.   Cardiovascular: Negative for chest pain, palpitations and leg swelling.  Gastrointestinal: Negative for abdominal pain, blood in stool, constipation and diarrhea.  Genitourinary: Negative for dysuria and hematuria.  Musculoskeletal: Negative for back pain and myalgias.  Skin: Negative for rash.  Neurological: Negative for dizziness, weakness and headaches.  Psychiatric/Behavioral: Negative for suicidal ideas.    Per HPI unless specifically indicated above   Allergies as of 10/26/2018   No Known Allergies     Medication List       Accurate as of October 26, 2018  2:53 PM. If you have any questions, ask your nurse or doctor.        STOP taking these medications    indomethacin 50 MG capsule Commonly known as: INDOCIN Stopped by: Fransisca Kaufmann Dettinger, MD   Mitigare 0.6 MG Caps Generic drug: Colchicine Stopped by: Fransisca Kaufmann Dettinger, MD        Objective:   BP (!) 145/103   Pulse 87   Temp 97.8 F (36.6 C) (Oral)   Ht 5\' 10"  (1.778 m)   Wt 223 lb 6.4 oz (101.3 kg)   BMI 32.05 kg/m   Wt Readings from Last 3 Encounters:  10/26/18 223 lb 6.4 oz (101.3 kg)  07/17/18 213 lb (96.6 kg)  06/29/18 218 lb 6.4 oz (99.1 kg)    Physical Exam Vitals signs and nursing note reviewed.  Constitutional:      General: He is not in acute distress.    Appearance: He is well-developed. He is not diaphoretic.  Eyes:     General: No scleral icterus.       Right eye: No discharge.     Conjunctiva/sclera: Conjunctivae normal.     Pupils: Pupils are equal, round, and reactive to light.  Neck:     Musculoskeletal: Neck supple.     Thyroid: No thyromegaly.  Cardiovascular:     Rate and Rhythm: Normal rate and regular rhythm.     Heart sounds: Normal heart sounds. No murmur.  Pulmonary:     Effort: Pulmonary effort is normal. No respiratory distress.     Breath sounds: Normal breath sounds. No wheezing.  Musculoskeletal: Normal range of motion.  Lymphadenopathy:     Cervical: No cervical adenopathy.  Skin:    General: Skin is warm and dry.     Findings: No rash.  Neurological:     Mental Status: He is alert and oriented to person, place, and time.     Coordination: Coordination normal.  Psychiatric:        Behavior: Behavior normal.       Assessment & Plan:   Problem List Items Addressed This Visit    None    Visit Diagnoses    Well adult exam    -  Primary   Essential hypertension       Mixed hyperlipidemia          Patient had blood work done at his workplace and will have that re-faxed over to [place] because I do not see it in his chart from this year.  We will continue to monitor blood pressure for now and if remains elevated over the  next year then we will consider treatment at that point.  He will try and refocus on his dietary lifestyle, has been difficult because the gyms are closed right now currently.   Follow up plan: Return in about 1 year (around 10/26/2019), or if symptoms worsen or fail to improve.  Counseling provided for all of the vaccine components No orders of the defined types were placed in this encounter.   Caryl Pina, MD [facility] 10/26/2018, 2:53 PM

## 2018-10-27 DIAGNOSIS — W57XXXS Bitten or stung by nonvenomous insect and other nonvenomous arthropods, sequela: Secondary | ICD-10-CM | POA: Diagnosis not present

## 2018-10-27 DIAGNOSIS — S40862S Insect bite (nonvenomous) of left upper arm, sequela: Secondary | ICD-10-CM | POA: Diagnosis not present

## 2018-10-27 DIAGNOSIS — M25461 Effusion, right knee: Secondary | ICD-10-CM | POA: Diagnosis not present

## 2018-10-27 DIAGNOSIS — M1120 Other chondrocalcinosis, unspecified site: Secondary | ICD-10-CM | POA: Diagnosis not present

## 2018-10-27 DIAGNOSIS — M130 Polyarthritis, unspecified: Secondary | ICD-10-CM | POA: Diagnosis not present

## 2018-11-02 ENCOUNTER — Encounter: Payer: Self-pay | Admitting: Family Medicine

## 2019-01-18 ENCOUNTER — Encounter: Payer: Self-pay | Admitting: Family Medicine

## 2019-01-26 DIAGNOSIS — Z139 Encounter for screening, unspecified: Secondary | ICD-10-CM | POA: Diagnosis not present

## 2019-01-26 DIAGNOSIS — E782 Mixed hyperlipidemia: Secondary | ICD-10-CM | POA: Diagnosis not present

## 2019-01-26 DIAGNOSIS — E559 Vitamin D deficiency, unspecified: Secondary | ICD-10-CM | POA: Diagnosis not present

## 2019-01-26 DIAGNOSIS — Z0130 Encounter for examination of blood pressure without abnormal findings: Secondary | ICD-10-CM | POA: Diagnosis not present

## 2019-02-09 DIAGNOSIS — R7301 Impaired fasting glucose: Secondary | ICD-10-CM | POA: Diagnosis not present

## 2019-02-09 DIAGNOSIS — Z23 Encounter for immunization: Secondary | ICD-10-CM | POA: Diagnosis not present

## 2019-02-09 DIAGNOSIS — E782 Mixed hyperlipidemia: Secondary | ICD-10-CM | POA: Diagnosis not present

## 2019-02-10 ENCOUNTER — Encounter: Payer: Self-pay | Admitting: Family Medicine

## 2019-03-11 DIAGNOSIS — Z0130 Encounter for examination of blood pressure without abnormal findings: Secondary | ICD-10-CM | POA: Diagnosis not present

## 2019-03-30 ENCOUNTER — Encounter: Payer: Self-pay | Admitting: Family Medicine

## 2019-07-01 ENCOUNTER — Ambulatory Visit: Payer: Self-pay | Attending: Internal Medicine

## 2019-07-01 DIAGNOSIS — Z23 Encounter for immunization: Secondary | ICD-10-CM | POA: Insufficient documentation

## 2019-07-01 NOTE — Progress Notes (Signed)
   Covid-19 Vaccination Clinic  Name:  Marco Reynolds    MRN: [id number] DOB: 1990-12-06  07/01/2019  Marco Reynolds was observed post Covid-19 immunization for 15 minutes without incident. He was provided with Vaccine Information Sheet and instruction to access the V-Safe system.   Marco Reynolds was instructed to call 911 with any severe reactions post vaccine: Marland Kitchen Difficulty breathing  . Swelling of face and throat  . A fast heartbeat  . A bad rash all over body  . Dizziness and weakness   Immunizations Administered    Name Date Dose VIS Date Route   Moderna COVID-19 Vaccine 07/01/2019 10:23 AM 0.5 mL 03/30/2019 Intramuscular   Manufacturer: Moderna   Lot: [id number]   [id number]

## 2019-08-03 ENCOUNTER — Ambulatory Visit: Payer: Self-pay | Attending: Internal Medicine

## 2019-08-03 DIAGNOSIS — Z23 Encounter for immunization: Secondary | ICD-10-CM

## 2019-08-03 NOTE — Progress Notes (Signed)
   Covid-19 Vaccination Clinic  Name:  Marco Reynolds    MRN: [id number] DOB: 04-15-1991  08/03/2019  [name] was observed post Covid-19 immunization for 15 minutes without incident. He was provided with Vaccine Information Sheet and instruction to access the V-Safe system.   [name] was instructed to call 911 with any severe reactions post vaccine: Marland Kitchen Difficulty breathing  . Swelling of face and throat  . A fast heartbeat  . A bad rash all over body  . Dizziness and weakness   Immunizations Administered    Name Date Dose VIS Date Route   Moderna COVID-19 Vaccine 08/03/2019 11:13 AM 0.5 mL 03/30/2019 Intramuscular   Manufacturer: Moderna   LotMV:[id number]   [id number]

## 2019-08-12 ENCOUNTER — Other Ambulatory Visit: Payer: Self-pay

## 2019-08-12 ENCOUNTER — Ambulatory Visit: Payer: BC Managed Care – PPO | Admitting: Family Medicine

## 2019-08-12 ENCOUNTER — Encounter: Payer: Self-pay | Admitting: Family Medicine

## 2019-08-12 VITALS — BP 135/80 | HR 95 | Temp 97.7°F | Ht 70.0 in | Wt 224.5 lb

## 2019-08-12 DIAGNOSIS — I10 Essential (primary) hypertension: Secondary | ICD-10-CM | POA: Diagnosis not present

## 2019-08-12 DIAGNOSIS — E782 Mixed hyperlipidemia: Secondary | ICD-10-CM | POA: Diagnosis not present

## 2019-08-12 NOTE — Progress Notes (Signed)
BP 135/80   Pulse 95   Temp 97.7 F (36.5 C) (Temporal)   Ht 5\' 10"  (1.778 m)   Wt 224 lb 8 oz (101.8 kg)   BMI 32.21 kg/m    Subjective:   Patient ID: Marco Reynolds, male    DOB: 1990/06/19, 29 y.o.   MRN: [id number]  HPI: [name] is a 29 y.o. male presenting on 08/12/2019 for Medical Management of Chronic Issues   HPI Hypertension Patient is currently on no medication currently, and their blood pressure today is 135/80. Patient denies any lightheadedness or dizziness. Patient denies headaches, blurred vision, chest pains, shortness of breath, or weakness. Denies any side effects from medication and is content with current medication.   Hyperlipidemia Patient is coming in for recheck of his hyperlipidemia. The patient is currently taking fish oil. They deny any issues with myalgias or history of liver damage from it. They deny any focal numbness or weakness or chest pain.   Relevant past medical, surgical, family and social history reviewed and updated as indicated. Interim medical history since our last visit reviewed. Allergies and medications reviewed and updated.  Review of Systems  Constitutional: Negative for chills and fever.  Respiratory: Negative for shortness of breath and wheezing.   Cardiovascular: Negative for chest pain and leg swelling.  Musculoskeletal: Negative for back pain and gait problem.  Skin: Negative for rash.  Neurological: Negative for dizziness, weakness and light-headedness.  All other systems reviewed and are negative.   Per HPI unless specifically indicated above   Allergies as of 08/12/2019   No Known Allergies     Medication List       Accurate as of August 12, 2019  2:20 PM. If you have any questions, ask your nurse or doctor.        FISH OIL BURP-LESS PO Take by mouth.   multivitamin tablet Take 1 tablet by mouth daily.        Objective:   BP 135/80   Pulse 95   Temp 97.7 F (36.5 C) (Temporal)    Ht 5\' 10"  (1.778 m)   Wt 224 lb 8 oz (101.8 kg)   BMI 32.21 kg/m   Wt Readings from Last 3 Encounters:  08/12/19 224 lb 8 oz (101.8 kg)  10/26/18 223 lb 6.4 oz (101.3 kg)  07/17/18 213 lb (96.6 kg)    Physical Exam Vitals and nursing note reviewed.  Constitutional:      General: He is not in acute distress.    Appearance: He is well-developed. He is not diaphoretic.  Eyes:     General: No scleral icterus.       Right eye: No discharge.     Conjunctiva/sclera: Conjunctivae normal.     Pupils: Pupils are equal, round, and reactive to light.  Neck:     Thyroid: No thyromegaly.  Cardiovascular:     Rate and Rhythm: Normal rate and regular rhythm.     Heart sounds: Normal heart sounds. No murmur.  Pulmonary:     Effort: Pulmonary effort is normal. No respiratory distress.     Breath sounds: Normal breath sounds. No wheezing.  Musculoskeletal:        General: Normal range of motion.     Cervical back: Neck supple.  Lymphadenopathy:     Cervical: No cervical adenopathy.  Skin:    General: Skin is warm and dry.     Findings: No rash.  Neurological:     Mental Status: He  is alert and oriented to person, place, and time.     Coordination: Coordination normal.  Psychiatric:        Behavior: Behavior normal.       Assessment & Plan:   Problem List Items Addressed This Visit    None    Visit Diagnoses    Essential hypertension    -  Primary   Mixed hyperlipidemia          Continue fish oils, cholesterol is elevated, he will do 6 months of diet and exercise and we will see where he is at next time but may have to start medication if he is the same next time.  Patient had blood work at work and showed that his LDL cholesterol is 160s and a borderline glucose of 104 but otherwise was normal kidney and liver function Follow up plan: Return in about 6 months (around 02/11/2020), or if symptoms worsen or fail to improve, for Hypertension and cholesterol.  Counseling  provided for all of the vaccine components No orders of the defined types were placed in this encounter.   Caryl Pina, MD [facility] 08/12/2019, 2:20 PM

## 2020-01-17 ENCOUNTER — Telehealth: Payer: Self-pay | Admitting: Family Medicine

## 2020-01-17 NOTE — Telephone Encounter (Signed)
Wife aware and verbalizes understanding per dpr.  °

## 2020-01-17 NOTE — Telephone Encounter (Signed)
Unfortunately hand-foot-and-mouth disease is a virus so there is not a whole lot that can be done besides using anti-inflammatories and using mouthwash and then making sure you stay hydrated really well and it will pass on its own, usually no more than 7 to 10 days.  Unfortunately there is not a whole lot else to do for hand-foot-and-mouth disease

## 2020-01-27 ENCOUNTER — Ambulatory Visit (INDEPENDENT_AMBULATORY_CARE_PROVIDER_SITE_OTHER): Payer: BC Managed Care – PPO | Admitting: Nurse Practitioner

## 2020-01-27 DIAGNOSIS — J069 Acute upper respiratory infection, unspecified: Secondary | ICD-10-CM

## 2020-01-27 MED ORDER — PREDNISONE 20 MG PO TABS: ORAL_TABLET | ORAL | 0 refills | Status: DC

## 2020-01-27 NOTE — Progress Notes (Signed)
Virtual Visit via telephone Note Due to COVID-19 pandemic this visit was conducted virtually. This visit type was conducted due to national recommendations for restrictions regarding the COVID-19 Pandemic (e.g. social distancing, sheltering in place) in an effort to limit this patient's exposure and mitigate transmission in our community. All issues noted in this document were discussed and addressed.  A physical exam was not performed with this format.  I connected with Marco Reynolds on 01/27/20 at 11:45 by telephone and verified that I am speaking with the correct person using two identifiers. Marco Reynolds is currently located at work and no one is currently with him during visit. The provider, Mary-Margaret Hassell Done, FNP is located in their office at time of visit.  I discussed the limitations, risks, security and privacy concerns of performing an evaluation and management service by telephone and the availability of in person appointments. I also discussed with the patient that there may be a patient responsible charge related to this service. The patient expressed understanding and agreed to proceed.   History and Present Illness:  Chief Complaint: Sinusitis   HPI Patient calls in c/o cough and congestion. Has had a sick child in house. Child dx with corup last week. Patient now is achy all over with cough that has become productive.     Review of Systems  Constitutional: Negative for chills and fever.  HENT: Positive for congestion and sore throat (slight). Negative for sinus pain.   Respiratory: Positive for cough (productive).   Cardiovascular: Negative.   Musculoskeletal: Negative.   Neurological: Positive for headaches.  Psychiatric/Behavioral: Negative.   All other systems reviewed and are negative.    Observations/Objective: Alert and oriented- answers all questions appropriately No distress Voice hoarse   Assessment and Plan: Marco Reynolds  in today with chief complaint of Sinusitis   1. Upper respiratory infection with cough and congestion 1. Take meds as prescribed 2. Use a cool mist humidifier especially during the winter months and when heat has been humid. 3. Use saline nose sprays frequently 4. Saline irrigations of the nose can be very helpful if done frequently.  * 4X daily for 1 week*  * Use of a nettie pot can be helpful with this. Follow directions with this* 5. Drink plenty of fluids 6. Keep thermostat turn down low 7.For any cough or congestion  Use plain Mucinex- regular strength or max strength is fine   * Children- consult with Pharmacist for dosing 8. For fever or aces or pains- take tylenol or ibuprofen appropriate for age and weight.  * for fevers greater than 101 orally you may alternate ibuprofen and tylenol every  3 hours.    - predniSONE (DELTASONE) 20 MG tablet; 2 po at sametime daily for 5 days  Dispense: 10 tablet; Refill: 0     Follow Up Instructions: prn    I discussed the assessment and treatment plan with the patient. The patient was provided an opportunity to ask questions and all were answered. The patient agreed with the plan and demonstrated an understanding of the instructions.   The patient was advised to call back or seek an in-person evaluation if the symptoms worsen or if the condition fails to improve as anticipated.  The above assessment and management plan was discussed with the patient. The patient verbalized understanding of and has agreed to the management plan. Patient is aware to call the clinic if symptoms persist or worsen. Patient is aware when to return to the  clinic for a follow-up visit. Patient educated on when it is appropriate to go to the emergency department.   Time call ended:  12:04  I provided 16 minutes of non-face-to-face time during this encounter.    Mary-Margaret Hassell Done, FNP

## 2020-01-28 ENCOUNTER — Ambulatory Visit
Admission: EM | Admit: 2020-01-28 | Discharge: 2020-01-28 | Disposition: A | Discharge status: Home / Self Care | Payer: BC Managed Care – PPO | Attending: Emergency Medicine | Admitting: Emergency Medicine

## 2020-01-28 ENCOUNTER — Other Ambulatory Visit: Payer: Self-pay

## 2020-01-28 ENCOUNTER — Encounter: Payer: Self-pay | Admitting: Sports Medicine

## 2020-01-28 ENCOUNTER — Ambulatory Visit: Payer: Self-pay

## 2020-01-28 ENCOUNTER — Ambulatory Visit (INDEPENDENT_AMBULATORY_CARE_PROVIDER_SITE_OTHER): Payer: BC Managed Care – PPO | Admitting: Sports Medicine

## 2020-01-28 ENCOUNTER — Telehealth: Payer: Self-pay | Admitting: *Deleted

## 2020-01-28 ENCOUNTER — Ambulatory Visit (INDEPENDENT_AMBULATORY_CARE_PROVIDER_SITE_OTHER): Payer: BC Managed Care – PPO

## 2020-01-28 ENCOUNTER — Ambulatory Visit: Payer: BC Managed Care – PPO | Admitting: Family Medicine

## 2020-01-28 DIAGNOSIS — I861 Scrotal varices: Secondary | ICD-10-CM | POA: Diagnosis not present

## 2020-01-28 DIAGNOSIS — R1032 Left lower quadrant pain: Secondary | ICD-10-CM

## 2020-01-28 DIAGNOSIS — N50819 Testicular pain, unspecified: Secondary | ICD-10-CM

## 2020-01-28 DIAGNOSIS — N451 Epididymitis: Secondary | ICD-10-CM

## 2020-01-28 DIAGNOSIS — Z113 Encounter for screening for infections with a predominantly sexual mode of transmission: Secondary | ICD-10-CM | POA: Diagnosis not present

## 2020-01-28 LAB — POCT URINALYSIS DIP (MANUAL ENTRY)
Bilirubin, UA: NEGATIVE
Blood, UA: NEGATIVE
Glucose, UA: NEGATIVE mg/dL
Ketones, POC UA: NEGATIVE mg/dL
Leukocytes, UA: NEGATIVE
Nitrite, UA: NEGATIVE
Protein Ur, POC: NEGATIVE mg/dL
Spec Grav, UA: 1.02 (ref 1.010–1.025)
Urobilinogen, UA: 0.2 E.U./dL
pH, UA: 6.5 (ref 5.0–8.0)

## 2020-01-28 MED ORDER — CIPROFLOXACIN HCL 750 MG PO TABS: 750.0000 mg | ORAL_TABLET | Freq: Two times a day (BID) | ORAL | 0 refills | Status: AC

## 2020-01-28 MED ORDER — MELOXICAM 7.5 MG PO TABS: 7.5000 mg | ORAL_TABLET | Freq: Every day | ORAL | 0 refills | Status: DC

## 2020-01-28 NOTE — ED Provider Notes (Signed)
[facility]   [id number] 01/28/20 Arrival Time: 0808  CC: ABDOMINAL DISCOMFORT  SUBJECTIVE:  Marco Reynolds is a 29 y.o. male who presents with complaint of LLQ and LT testicle discomfort x 1 day.  Denies a precipitating event, trauma.  Reports doing yard work this past weekend and picking, may have been lifting something heavy.  Localizes pain to LLQ and LT testicle.  Describes as intermittent and achy.  Has tried rest with relief.  Reports worsening symptoms with urinating.  Denies similar symptoms in the past.  Does report hx of inguinal hernia when he was 83, however, describes that pain as intermittent and sharp at the time.  States that "healed itself."  Last BM yesterday.  Reports possible fever of 101 at home yesterday.  Took tylenol with relief.  99.8 in office.  States son currently has HFM and has been dealing with a viral illness as well. Reports nausea.    Denies vomiting, chest pain, SOB, diarrhea, constipation, hematochezia, melena, dysuria, difficulty urinating, increased frequency or urgency, flank pain, loss of bowel or bladder function, testicular pain, testicular swelling, genital rashes/ lesion, urethral discharge, concern for STDs.    No LMP for male patient.  ROS: As per HPI.  All other pertinent ROS negative.     History reviewed. No pertinent past medical history. Past Surgical History:  Procedure Laterality Date   MENISCUS REPAIR Right    No Known Allergies No current facility-administered medications on file prior to encounter.   Current Outpatient Medications on File Prior to Encounter  Medication Sig Dispense Refill   Multiple Vitamin (MULTIVITAMIN) tablet Take 1 tablet by mouth daily.     Omega-3 Fatty Acids (FISH OIL BURP-LESS PO) Take by mouth.     Social History   Socioeconomic History   Marital status: Married    Spouse name: Not on file   Number of children: Not on file   Years of education: Not on file   Highest  education level: Not on file  Occupational History   Not on file  Tobacco Use   Smoking status: Never Smoker   Smokeless tobacco: Former Systems developer  Substance and Sexual Activity   Alcohol use: Yes    Comment: occ   Drug use: No   Sexual activity: Not on file  Other Topics Concern   Not on file  Social History Narrative   Not on file   Social Determinants of Health   Financial Resource Strain:    Difficulty of Paying Living Expenses: Not on file  Food Insecurity:    Worried About [name] in the Last Year: Not on file   [employer] of Food in the Last Year: Not on file  Transportation Needs:    Lack of Transportation (Medical): Not on file   Lack of Transportation (Non-Medical): Not on file  Physical Activity:    Days of Exercise per Week: Not on file   Minutes of Exercise per Session: Not on file  Stress:    Feeling of Stress : Not on file  Social Connections:    Frequency of Communication with Friends and Family: Not on file   Frequency of Social Gatherings with Friends and Family: Not on file   Attends Religious Services: Not on file   Active Member of Clubs or Organizations: Not on file   Attends Archivist Meetings: Not on file   Marital Status: Not on file  Intimate Partner Violence:    Fear of Current  or Ex-Partner: Not on file   Emotionally Abused: Not on file   Physically Abused: Not on file   Sexually Abused: Not on file   Family History  Problem Relation Age of Onset   Healthy Mother    Healthy Father    Healthy Brother    Healthy Brother      OBJECTIVE:  Vitals:   01/28/20 0823  BP: 131/88  Pulse: 95  Resp: 20  Temp: 99.8 F (37.7 C)  SpO2: 97%    General appearance: Alert; NAD HEENT: NCAT.  Oropharynx clear.  Lungs: clear to auscultation bilaterally without adventitious breath sounds Heart: regular rate and rhythm.   Abdomen: soft, non-distended; normal active bowel sounds; non-tender to light  and deep palpation; nontender at McBurney's point; no guarding GU: [name] RT present as chaperone.  Circumcised male; no obvious rashes or lesions; no penile discharge; no testicular masses or nodules appreciated, mild discomfort to palpation over LT testicle; subtle LT sided inguinal hernia Extremities: no edema; symmetrical with no gross deformities Skin: warm and dry Neurologic: normal gait Psychological: alert and cooperative; normal mood and affect  LABS: Results for orders placed or performed during the hospital encounter of 01/28/20 (from the past 24 hour(s))  POCT urinalysis dipstick     Status: None   Collection Time: 01/28/20  8:53 AM  Result Value Ref Range   Color, UA yellow yellow   Clarity, UA clear clear   Glucose, UA negative negative mg/dL   Bilirubin, UA negative negative   Ketones, POC UA negative negative mg/dL   Spec Grav, UA 1.020 1.010 - 1.025   Blood, UA negative negative   pH, UA 6.5 5.0 - 8.0   Protein Ur, POC negative negative mg/dL   Urobilinogen, UA 0.2 0.2 or 1.0 E.U./dL   Nitrite, UA Negative Negative   Leukocytes, UA Negative Negative    ASSESSMENT & PLAN:  1. Abdominal discomfort in left lower quadrant   2. Testicular discomfort     Meds ordered this encounter  Medications   meloxicam (MOBIC) 7.5 MG tablet    Sig: Take 1 tablet (7.5 mg total) by mouth daily.    Dispense:  20 tablet    Refill:  0    Order Specific Question:   Supervising Provider    Answer:   Raylene Everts [[id number]]   Unable to rule out incarcerated inguinal hernia or torsion in urgent care setting.  Offered patient further evaluation and management in the ED.  Patient declines at this time and would like to try outpatient therapy first.  Aware of the risk associated with this decision including missed diagnosis, organ damage, organ failure, and/or death.  Patient aware and in agreement.     Urine without signs of infection or blood On exam I felt a possible LT  inguinal hernia.  CT scan would be recommended for further evaluation  Rest and avoid heavy lifting/ straining Mobic prescribed for pain.  Follow up with PCP or general surgeon for further evaluation and management of possible hernia Return or go to the ED if you have any new or worsening symptoms such as fever, chills, nausea, vomiting, abdominal pain, testicular pain, testicular swelling, testicular discoloration, urinary or bowel changes, etc...   Reviewed expectations re: course of current medical issues. Questions answered. Outlined signs and symptoms indicating need for more acute intervention. Patient verbalized understanding. After Visit Summary given.   Lestine Box, PA-C 01/28/20 [phone]

## 2020-01-28 NOTE — ED Triage Notes (Signed)
Pt states he began having left lower abdominal pain yesterday , pain radiates into groin and testicle, denies constipation or diarrhea

## 2020-01-28 NOTE — Assessment & Plan Note (Addendum)
This is a pleasant 29 year old male, previously healthy with the exception of a viral infection from his son. Yesterday he developed severe pain in his left testicle, localized at the very top. He denies any burning, urgency, frequency with urination, no penile discharge. On exam he has a swollen left testicle, with tenderness at the top on the testicle consistent with epididymitis, he also has mild left varicocele in the scrotum. No obvious evidence of an inguinal hernia, testicle is hanging at the normal level indicating no obvious torsion however with severity of pain and swelling we will certainly get a testicular ultrasound. I am going to do full STD screening as is standard for epididymitis and we will also treat him with ciprofloxacin. He does have prednisone prescribed for his viral infection, he can finish this and then start his meloxicam. I have also recommended scrotal support in the meantime.  Diagnosis confirmed on ultrasound, no change in plan.

## 2020-01-28 NOTE — Progress Notes (Addendum)
    Procedures performed today:    None.  Independent interpretation of notes and tests performed by another provider:   None.  Brief History, Exam, Impression, and Recommendations:    Epididymitis, left This is a pleasant 29 year old male, previously healthy with the exception of a viral infection from his son. Yesterday he developed severe pain in his left testicle, localized at the very top. He denies any burning, urgency, frequency with urination, no penile discharge. On exam he has a swollen left testicle, with tenderness at the top on the testicle consistent with epididymitis, he also has mild left varicocele in the scrotum. No obvious evidence of an inguinal hernia, testicle is hanging at the normal level indicating no obvious torsion however with severity of pain and swelling we will certainly get a testicular ultrasound. I am going to do full STD screening as is standard for epididymitis and we will also treat him with ciprofloxacin. He does have prednisone prescribed for his viral infection, he can finish this and then start his meloxicam. I have also recommended scrotal support in the meantime.  Diagnosis confirmed on ultrasound, no change in plan.    ___________________________________________ [name]. [name], M.D., ABFM., CAQSM. Primary Care and [place] In[employer] of [employer] [facility]

## 2020-01-28 NOTE — Telephone Encounter (Signed)
Pt came in today for a visit for inguinal hernia but had fever and cough and congestion. Pt was seen at [facility] today and was told to follow up with PCP. Advised pt due to his symptoms he would need to either do a Virtual visit or could come back during our respiratory/COVID clinic at 4:10 today due to his symptoms. Pt states he doesn't want to do that and that he was sent here by the [facility]. Advised pt we can't see him outside of our respiratory/COVID clinic with his current symptoms and if he came back at 4:10 we could address the inguinal hernia as well as any other symptoms he is having but pt declined and walked out.

## 2020-01-28 NOTE — Discharge Instructions (Signed)
Urine without signs of infection or blood On exam I felt a possible LT inguinal hernia.  CT scan would be recommended for further evaluation  Rest and avoid heavy lifting/ straining Mobic prescribed for pain.  Follow up with PCP or general surgeon for further evaluation and management of possible hernia Return or go to the ED if you have any new or worsening symptoms such as fever, chills, nausea, vomiting, abdominal pain, testicular pain, testicular swelling, testicular discoloration, urinary or bowel changes, etc..Marland Kitchen

## 2020-01-28 NOTE — Patient Instructions (Signed)
Epididymitis  Epididymitis is swelling (inflammation) or infection of the epididymis. The epididymis is a cord-like structure that is located along the top and back part of the testicle. It collects and stores sperm from the testicle. This condition can also cause pain and swelling of the testicle and scrotum. Symptoms usually start suddenly (acute epididymitis). Sometimes epididymitis starts gradually and lasts for a while (chronic epididymitis). This type may be harder to treat. What are the causes? In men ages 20-40, this condition is usually caused by a bacterial infection or a sexually transmitted disease (STD), such as:  Gonorrhea.  Chlamydia. In men 40 and older who do not have anal sex, this condition is usually caused by bacteria from a blockage or from abnormalities in the urinary system. These can result from:  Having a tube placed into the bladder (urinary catheter).  Having an enlarged or inflamed prostate gland.  Having recently had urinary tract surgery.  Having a problem with a backward flow of urine (retrograde). In men who have a condition that weakens the body's defense system (immune system), such as HIV, this condition can be caused by:  Other bacteria, including tuberculosis and syphilis.  Viruses.  Fungi. Sometimes this condition occurs without infection. This may happen because of trauma or repetitive activities such as sports. What increases the risk? You are more likely to develop this condition if you have:  Unprotected sex with more than one partner.  Anal sex.  Recently had surgery.  A urinary catheter.  Urinary problems.  A suppressed immune system. What are the signs or symptoms? This condition usually begins suddenly with chills, fever, and pain behind the scrotum and in the testicle. Other symptoms include:  Swelling of the scrotum, testicle, or both.  Pain when ejaculating or urinating.  Pain in the back or  abdomen.  Nausea.  Itching and discharge from the penis.  A frequent need to pass urine.  Redness, increased warmth, and tenderness of the scrotum. How is this diagnosed? Your health care provider can diagnose this condition based on your symptoms and medical history. Your health care provider will also do a physical exam to ask about your symptoms and check your scrotum and testicle for swelling, pain, and redness. You may also have other tests, including:  Examination of discharge from the penis.  Urine tests for infections, such as STDs.  Ultrasound test for blood flow and inflammation. Your health care provider may test you for other STDs, including HIV. How is this treated? Treatment for this condition depends on the cause. If your condition is caused by a bacterial infection, oral antibiotic medicine may be prescribed. If the bacterial infection has spread to your blood, you may need to receive IV antibiotics. For both bacterial and nonbacterial epididymitis, you may be treated with:  Rest.  Elevation of the scrotum.  Pain medicines.  Anti-inflammatory medicines. Surgery may be needed to treat:  Bacterial epididymitis that causes pus to build up in the scrotum (abscess).  Chronic epididymitis that has not responded to other treatments. Follow these instructions at home: Medicines  Take over-the-counter and prescription medicines only as told by your health care provider.  If you were prescribed an antibiotic medicine, take it as told by your health care provider. Do not stop taking the antibiotic even if your condition improves. Sexual activity  If your epididymitis was caused by an STD, avoid sexual activity until your treatment is complete.  Inform your sexual partner or partners if you test positive for   an STD. They may need to be treated. Do not engage in sexual activity with your partner or partners until their treatment is completed. Managing pain and  swelling   If directed, elevate your scrotum and apply ice. ? Put ice in a plastic bag. ? Place a small towel or pillow between your legs. ? Rest your scrotum on the pillow or towel. ? Place another towel between your skin and the plastic bag. ? Leave the ice on for 20 minutes, 2-3 times a day.  Try taking a sitz bath to help with discomfort. This is a warm water bath that is taken while you are sitting down. The water should only come up to your hips and should cover your buttocks. Do this 3-4 times per day or as told by your health care provider.  Keep your scrotum elevated and supported while resting. Ask your health care provider if you should wear a scrotal support, such as a jockstrap. Wear it as told by your health care provider. General instructions  Return to your normal activities as told by your health care provider. Ask your health care provider what activities are safe for you.  Drink enough fluid to keep your urine pale yellow.  Keep all follow-up visits as told by your health care provider. This is important. Contact a health care provider if:  You have a fever.  Your pain medicine is not helping.  Your pain is getting worse.  Your symptoms do not improve within 3 days. Summary  Epididymitis is swelling (inflammation) or infection of the epididymis. This condition can also cause pain and swelling of the testicle and scrotum.  Treatment for this condition depends on the cause. If your condition is caused by a bacterial infection, oral antibiotic medicine may be prescribed.  Inform your sexual partner or partners if you test positive for an STD. They may need to be treated. Do not engage in sexual activity with your partner or partners until their treatment is completed.  Contact a health care provider if your symptoms do not improve within 3 days. This information is not intended to replace advice given to you by your health care provider. Make sure you discuss any  questions you have with your health care provider. Document Revised: 02/16/2018 Document Reviewed: 02/17/2018 Elsevier Patient Education  [place].  Varicocele  A varicocele is a swelling of veins in the scrotum. The scrotum is the sac that contains the testicles. Varicoceles can occur on either side of the scrotum, but they are more common on the left side. They occur most often in teenage boys and young men. In most cases, varicoceles are not a serious problem. They are usually small and painless and do not require treatment. Tests may be done to confirm the diagnosis. Treatment may be needed if:  A varicocele is large, causes a lot of pain, or causes pain when exercising.  Varicoceles are found on both sides of the scrotum.  A varicocele causes a decrease in the size of the testicle in a growing adolescent.  The person has fertility problems. What are the causes? This condition is the result of valves in the veins not working properly. Valves in the veins help to return blood from the scrotum and testicles to the heart. If these valves do not work well, blood flows backward and backs up into the veins, which causes the veins to swell. This is similar to what happens when varicose veins form in the leg. What are  the signs or symptoms? Most varicoceles do not cause any symptoms. If symptoms do occur, they may include:  Swelling on one side of the scrotum. The swelling may be more obvious when you are standing up.  A lumpy feeling in the scrotum.  A heavy feeling on one side of the scrotum.  A dull ache in the scrotum, especially after exercise or prolonged standing or sitting.  Slower growth or reduced size of the testicle on the side of the varicocele (in young males).  Problems with fathering a child (fertility). This can occur if the testicle does not grow normally or if the condition causes problems with the sperm, such as a low sperm count or sperm that are not able to  reach the egg (poor motility). How is this diagnosed? This condition is diagnosed based on:  Your medical history.  A physical exam. Your health care provider may inspect and feel (palpate) the scrotal area to check for swollen or enlarged veins.  An ultrasound. This may be done to confirm the diagnosis and to help rule out other causes of the swelling. How is this treated? Treatment is usually not needed for this condition. If you have any pain, your health care provider may prescribe or recommend medicine to help relieve it. You may need regular exams so your health care provider can monitor the varicocele to ensure that it does not cause problems. When further treatment is needed, it may involve one of these options:  Varicocelectomy. This is a surgery in which the swollen veins are tied off so that the flow of blood goes to other veins instead.  Embolization. In this procedure, a small, thin tube (catheter) is used to place metal coils or other blocking items in the veins. This cuts off the blood flow to the swollen veins. Follow these instructions at home:  Take over-the-counter and prescription medicines only as told by your health care provider.  Wear supportive underwear.  Use an athletic supporter when participating in sports activities.  Keep all follow-up visits as told by your health care provider. This is important. Contact a health care provider if:  Your pain is increasing.  You have redness in the affected area.  Your testicle becomes enlarged, swollen, or painful.  You have swelling that does not decrease when you are lying down.  One of your testicles is smaller than the other. Get help right away if:  You develop swelling in your legs.  You have difficulty breathing. Summary  Varicocele is a condition in which the veins in the scrotum are swollen or enlarged.  In most cases, varicoceles do not require treatment.  Treatment may be needed if you have  pain, have problems with infertility, or have a smaller testicle associated with the varicocele.  In some cases, the condition may be treated with a procedure to cut off the flow of blood to the swollen veins. This information is not intended to replace advice given to you by your health care provider. Make sure you discuss any questions you have with your health care provider. Document Revised: 07/03/2018 Document Reviewed: 07/03/2018 Elsevier Patient Education  [place].

## 2020-01-30 LAB — URINE CULTURE: Culture: NO GROWTH

## 2020-01-31 LAB — CBC WITH DIFFERENTIAL/PLATELET
Absolute Monocytes: 933 cells/uL (ref 200–950)
Basophils Absolute: 31 cells/uL (ref 0–200)
Basophils Relative: 0.2 %
Eosinophils Absolute: 92 cells/uL (ref 15–500)
Eosinophils Relative: 0.6 %
HCT: 40.6 % (ref 38.5–50.0)
Hemoglobin: 13.5 g/dL (ref 13.2–17.1)
Lymphs Abs: 2800 cells/uL (ref 850–3900)
MCH: 28.7 pg (ref 27.0–33.0)
MCHC: 33.3 g/dL (ref 32.0–36.0)
MCV: 86.2 fL (ref 80.0–100.0)
MPV: 10.7 fL (ref 7.5–12.5)
Monocytes Relative: 6.1 %
Neutro Abs: 11444 cells/uL — ABNORMAL HIGH (ref 1500–7800)
Neutrophils Relative %: 74.8 %
Platelets: 323 10*3/uL (ref 140–400)
RBC: 4.71 10*6/uL (ref 4.20–5.80)
RDW: 11.9 % (ref 11.0–15.0)
Total Lymphocyte: 18.3 %
WBC: 15.3 10*3/uL — ABNORMAL HIGH (ref 3.8–10.8)

## 2020-01-31 LAB — HEPATITIS PANEL, ACUTE
Hep A IgM: NONREACTIVE
Hep B C IgM: NONREACTIVE
Hepatitis B Surface Ag: NONREACTIVE
Hepatitis C Ab: NONREACTIVE
SIGNAL TO CUT-OFF: 0.01 (ref <1.00)

## 2020-01-31 LAB — COMPREHENSIVE METABOLIC PANEL
AG Ratio: 1.7 (calc) (ref 1.0–2.5)
ALT: 13 U/L (ref 9–46)
AST: 13 U/L (ref 10–40)
Albumin: 4.7 g/dL (ref 3.6–5.1)
Alkaline phosphatase (APISO): 71 U/L (ref 36–130)
BUN: 9 mg/dL (ref 7–25)
CO2: 31 mmol/L (ref 20–32)
Calcium: 11.5 mg/dL — ABNORMAL HIGH (ref 8.6–10.3)
Chloride: 100 mmol/L (ref 98–110)
Creat: 1.01 mg/dL (ref 0.60–1.35)
Globulin: 2.7 g/dL (calc) (ref 1.9–3.7)
Glucose, Bld: 90 mg/dL (ref 65–139)
Potassium: 4.6 mmol/L (ref 3.5–5.3)
Sodium: 139 mmol/L (ref 135–146)
Total Bilirubin: 0.4 mg/dL (ref 0.2–1.2)
Total Protein: 7.4 g/dL (ref 6.1–8.1)

## 2020-01-31 LAB — C. TRACHOMATIS/N. GONORRHOEAE RNA
C. trachomatis RNA, TMA: NOT DETECTED
N. gonorrhoeae RNA, TMA: NOT DETECTED

## 2020-01-31 LAB — RPR: RPR Ser Ql: NONREACTIVE

## 2020-01-31 LAB — HIV ANTIBODY (ROUTINE TESTING W REFLEX): HIV 1&2 Ab, 4th Generation: NONREACTIVE

## 2020-02-04 ENCOUNTER — Ambulatory Visit (INDEPENDENT_AMBULATORY_CARE_PROVIDER_SITE_OTHER): Payer: BC Managed Care – PPO | Admitting: Sports Medicine

## 2020-02-04 DIAGNOSIS — N451 Epididymitis: Secondary | ICD-10-CM | POA: Diagnosis not present

## 2020-02-04 NOTE — Assessment & Plan Note (Addendum)
This is a pleasant 29 year old male, incidentally noted hypercalcemia, we are going to pull the trigger for PTH, ionized calcium. He can get these done today.  Calcium levels continue to be elevated, PTH is also elevated concerning for parathyroid adenoma, I would like an opinion from endocrinology.

## 2020-02-04 NOTE — Progress Notes (Addendum)
    Procedures performed today:    None.  Independent interpretation of notes and tests performed by another provider:   None.  Brief History, Exam, Impression, and Recommendations:    Hypercalcemia This is a pleasant 29 year old male, incidentally noted hypercalcemia, we are going to pull the trigger for PTH, ionized calcium. He can get these done today.  Calcium levels continue to be elevated, PTH is also elevated concerning for parathyroid adenoma, I would like an opinion from endocrinology.  Epididymitis, left Symptoms are improving considerably with steroids, anti-inflammatories, meloxicam. Lab testing was negative. No further imaging needed.    ___________________________________________ [name]. [name], M.D., ABFM., CAQSM. Primary Care and [place] Instructor of [place] [facility]

## 2020-02-04 NOTE — Assessment & Plan Note (Signed)
Symptoms are improving considerably with steroids, anti-inflammatories, meloxicam. Lab testing was negative. No further imaging needed.

## 2020-02-07 DIAGNOSIS — Z23 Encounter for immunization: Secondary | ICD-10-CM | POA: Diagnosis not present

## 2020-02-07 LAB — PTH, INTACT AND CALCIUM
Calcium: 11.1 mg/dL — ABNORMAL HIGH (ref 8.6–10.3)
PTH: 68 pg/mL — ABNORMAL HIGH (ref 14–64)

## 2020-02-07 NOTE — Addendum Note (Signed)
Addended by: Silverio Decamp on: 02/07/2020 04:45 PM   Modules accepted: Orders

## 2020-02-10 ENCOUNTER — Ambulatory Visit: Payer: BC Managed Care – PPO | Admitting: Family Medicine

## 2020-03-02 DIAGNOSIS — Z23 Encounter for immunization: Secondary | ICD-10-CM | POA: Diagnosis not present

## 2020-03-15 ENCOUNTER — Ambulatory Visit: Payer: BC Managed Care – PPO | Admitting: Internal Medicine

## 2020-03-15 ENCOUNTER — Encounter: Payer: Self-pay | Admitting: Internal Medicine

## 2020-03-15 ENCOUNTER — Other Ambulatory Visit: Payer: Self-pay

## 2020-03-15 VITALS — BP 136/82 | HR 99 | Ht 70.0 in | Wt 228.0 lb

## 2020-03-15 DIAGNOSIS — E213 Hyperparathyroidism, unspecified: Secondary | ICD-10-CM

## 2020-03-15 NOTE — Patient Instructions (Signed)
-   Stay Hydrated - AVOID all over the counter calcium tablets - Consume 2-3 servings of calcium daily in the diet      24-Hour Urine Collection   You will be collecting your urine for a 24-hour period of time.  Your timer starts with your first urine of the morning (For example - If you first pee at [place], your timer will start at [place])  [place] away your first urine of the morning  Collect your urine every time you pee for the next 24 hours STOP your urine collection 24 hours after you started the collection (For example - You would stop at 9AM the day after you started)

## 2020-03-15 NOTE — Progress Notes (Signed)
Name: Marco Reynolds  MRN/ DOB: [id number], 1990/10/24    Age/ Sex: 29 y.o., male    PCP: Dettinger, Fransisca Kaufmann, MD   Reason for Endocrinology Evaluation: Hypercalcemia      Date of Initial Endocrinology Evaluation: 03/15/2020     HPI: Marco Reynolds is a 29 y.o. male with unremarkable  past medical history. The patient presented for initial endocrinology clinic visit on 03/15/2020 for consultative assistance with his Hypercalcemia     Marco Reynolds indicates that he was first diagnosed with hypercalcemia in 2019. Since that time, he has not experienced symptoms of constipation, polyuria, polydipsia, generalized weakness, diffuse muscle pains, significant memory impairment. He admit to  use of over the counter calcium (MVI and rarely tums) , lithium, HCTZ, or vitamin D supplements.   He denies  history of kidney stones, kidney disease, liver disease, granulomatous disease. He denies  osteoporosis but had a hand fracture while playing sports . Daily dietary calcium intake: 1 servings . He denies  family history of osteoporosis, parathyroid disease,thyroid disease      HISTORY:   Past Medical History: No past medical history on file. Past Surgical History:  Past Surgical History:  Procedure Laterality Date  . MENISCUS REPAIR Right       Social History:  reports that he has never smoked. He has quit using smokeless tobacco. He reports current alcohol use. He reports that he does not use drugs.  Family History: family history includes Healthy in his brother, brother, father, and mother.   HOME MEDICATIONS: Allergies as of 03/15/2020   No Known Allergies     Medication List       Accurate as of March 15, 2020  3:27 PM. If you have any questions, ask your nurse or doctor.        STOP taking these medications   meloxicam 7.5 MG tablet Commonly known as: Mobic Stopped by: Dorita Sciara, MD     TAKE these medications   FISH OIL BURP-LESS  PO Take by mouth.   multivitamin tablet Take 1 tablet by mouth daily.         REVIEW OF SYSTEMS: A comprehensive ROS was conducted with the patient and is negative except as per HPI and below:  ROS     OBJECTIVE:  VS: BP 136/82   Pulse 99   Ht 5\' 10"  (1.778 m)   Wt 228 lb (103.4 kg)   SpO2 98%   BMI 32.71 kg/m    Wt Readings from Last 3 Encounters:  03/15/20 228 lb (103.4 kg)  08/12/19 224 lb 8 oz (101.8 kg)  10/26/18 223 lb 6.4 oz (101.3 kg)     EXAM: General: Pt appears well and is in NAD  Neck: General: Supple without adenopathy. Thyroid: Thyroid size normal.  No goiter or nodules appreciated.   Lungs: Clear with good BS bilat with no rales, rhonchi, or wheezes  Heart: Auscultation: RRR.  Abdomen: Normoactive bowel sounds, soft, nontender, without masses or organomegaly palpable  Extremities:  BL LE: No pretibial edema normal ROM and strength.  Skin: Hair: Texture and amount normal with gender appropriate distribution Skin Inspection: No rashes Skin Palpation: Skin temperature, texture, and thickness normal to palpation  Neuro: Cranial nerves: II - XII grossly intact  Motor: Normal strength throughout DTRs: 2+ and symmetric in UE without delay in relaxation phase  Mental Status: Judgment, insight: Intact Orientation: Oriented to time, place, and person Mood and affect: No depression, anxiety, or  agitation     DATA REVIEWED:  Results for KILEY, TORRENCE "Marco Reynolds" (MRN [id number]) as of 03/17/2020 09:23  Ref. Range 01/28/2020 15:40 02/04/2020 09:59  Sodium Latest Ref Range: 135 - 146 mmol/L 139   Potassium Latest Ref Range: 3.5 - 5.3 mmol/L 4.6   Chloride Latest Ref Range: 98 - 110 mmol/L 100   CO2 Latest Ref Range: 20 - 32 mmol/L 31   Glucose Latest Ref Range: 65 - 139 mg/dL 90   BUN Latest Ref Range: 7 - 25 mg/dL 9   Creatinine Latest Ref Range: 0.60 - 1.35 mg/dL 1.01   Calcium Latest Ref Range: 8.6 - 10.3 mg/dL 11.5 (H) 11.1 (H)  BUN/Creatinine  Ratio Latest Ref Range: 6 - 22 (calc) NOT APPLICABLE   AG Ratio Latest Ref Range: 1.0 - 2.5 (calc) 1.7   AST Latest Ref Range: 10 - 40 U/L 13   ALT Latest Ref Range: 9 - 46 U/L 13   Total Protein Latest Ref Range: 6.1 - 8.1 g/dL 7.4   Total Bilirubin Latest Ref Range: 0.2 - 1.2 mg/dL 0.4   Alkaline phosphatase (APISO) Latest Ref Range: 36 - 130 U/L 71   Globulin Latest Ref Range: 1.9 - 3.7 g/dL (calc) 2.7   PTH, Intact Latest Ref Range: 14 - 64 pg/mL  68 (H)  Albumin MSPROF Latest Ref Range: 3.6 - 5.1 g/dL 4.7     ASSESSMENT/PLAN/RECOMMENDATIONS:   1. Hyperparathyroidism:   - We discussed differential of familial hypercalcinuric hypercalcemia ([place]) vs Primary Hyperparathyroidism (pHPT)  - It is important to differentiate between the two, as [place] does not cause any organ damage and does not require further follow up , on the other hand pHPT could cause end organ damage and would require further evaluation.  - Given the level of his serum calcium elevation this is more consistent with Primary hyperparathyroidism but will proceed with 24-hr urine collection for Ca/Cr ratio     Recommendations: - Stay Hydrated - AVOID all over the counter calcium tablets - Consume 2-3 servings of calcium daily in the diet   F/U in 3 months    Signed electronically by: Mack Guise, MD  [facility] End[facility]  [facility] [address] [place], [place] [address] Phone: [phone] FAX: [phone]   CC: Dettinger, Fransisca Kaufmann, MD [place] [address] Phone: [phone] Fax: [phone]   Return to Endocrinology clinic as below: No future appointments.

## 2020-04-13 ENCOUNTER — Ambulatory Visit (INDEPENDENT_AMBULATORY_CARE_PROVIDER_SITE_OTHER): Payer: BC Managed Care – PPO | Admitting: Physician Assistant

## 2020-04-13 ENCOUNTER — Other Ambulatory Visit: Payer: Self-pay

## 2020-04-13 ENCOUNTER — Encounter: Payer: Self-pay | Admitting: Physician Assistant

## 2020-04-13 VITALS — BP 134/84 | HR 65 | Temp 98.0°F | Ht 70.5 in | Wt 224.0 lb

## 2020-04-13 DIAGNOSIS — E785 Hyperlipidemia, unspecified: Secondary | ICD-10-CM | POA: Diagnosis not present

## 2020-04-13 DIAGNOSIS — R030 Elevated blood-pressure reading, without diagnosis of hypertension: Secondary | ICD-10-CM | POA: Diagnosis not present

## 2020-04-13 DIAGNOSIS — Z8249 Family history of ischemic heart disease and other diseases of the circulatory system: Secondary | ICD-10-CM | POA: Diagnosis not present

## 2020-04-13 NOTE — Patient Instructions (Addendum)
Your assessment today looks good.  BP is borderline so we want to work hard on diet and exercise.  The same with cholesterol.   I want you to work up to goal of 150 minutes per week of aerobic exercise (nothing to intense but things that will get your heart rate up a bit -- brisk walk, jog, swimming, etc). Try to get in a little resistance training for 20 minutes twice weekly as well.   Try to follow dietary recommendations below. Start an OTC omega-3 supplement daily.   Let's follow-up in 3 months for reassessment.  Sooner if needed.    DASH Eating Plan DASH stands for "Dietary Approaches to Stop Hypertension." The DASH eating plan is a healthy eating plan that has been shown to reduce high blood pressure (hypertension). It may also reduce your risk for type 2 diabetes, heart disease, and stroke. The DASH eating plan may also help with weight loss. What are tips for following this plan?  General guidelines  Avoid eating more than 2,300 mg (milligrams) of salt (sodium) a day. If you have hypertension, you may need to reduce your sodium intake to 1,500 mg a day.  Limit alcohol intake to no more than 1 drink a day for nonpregnant women and 2 drinks a day for men. One drink equals 12 oz of beer, 5 oz of wine, or 1 oz of hard liquor.  Work with your health care provider to maintain a healthy body weight or to lose weight. Ask what an ideal weight is for you.  Get at least 30 minutes of exercise that causes your heart to beat faster (aerobic exercise) most days of the week. Activities may include walking, swimming, or biking.  Work with your health care provider or diet and nutrition specialist (dietitian) to adjust your eating plan to your individual calorie needs. Reading food labels   Check food labels for the amount of sodium per serving. Choose foods with less than 5 percent of the Daily Value of sodium. Generally, foods with less than 300 mg of sodium per serving fit into this eating  plan.  To find whole grains, look for the word "whole" as the first word in the ingredient list. Shopping  Buy products labeled as "low-sodium" or "no salt added."  Buy fresh foods. Avoid canned foods and premade or frozen meals. Cooking  Avoid adding salt when cooking. Use salt-free seasonings or herbs instead of table salt or sea salt. Check with your health care provider or pharmacist before using salt substitutes.  Do not fry foods. Cook foods using healthy methods such as baking, boiling, grilling, and broiling instead.  Cook with heart-healthy oils, such as olive, canola, soybean, or sunflower oil. Meal planning  Eat a balanced diet that includes: ? 5 or more servings of fruits and vegetables each day. At each meal, try to fill half of your plate with fruits and vegetables. ? Up to 6-8 servings of whole grains each day. ? Less than 6 oz of lean meat, poultry, or fish each day. A 3-oz serving of meat is about the same size as a deck of cards. One egg equals 1 oz. ? 2 servings of low-fat dairy each day. ? A serving of nuts, seeds, or beans 5 times each week. ? Heart-healthy fats. Healthy fats called Omega-3 fatty acids are found in foods such as flaxseeds and coldwater fish, like sardines, salmon, and mackerel.  Limit how much you eat of the following: ? Canned or prepackaged foods. ?  Food that is high in trans fat, such as fried foods. ? Food that is high in saturated fat, such as fatty meat. ? Sweets, desserts, sugary drinks, and other foods with added sugar. ? Full-fat dairy products.  Do not salt foods before eating.  Try to eat at least 2 vegetarian meals each week.  Eat more home-cooked food and less restaurant, buffet, and fast food.  When eating at a restaurant, ask that your food be prepared with less salt or no salt, if possible. What foods are recommended? The items listed may not be a complete list. Talk with your dietitian about what dietary choices are best  for you. Grains Whole-grain or whole-wheat bread. Whole-grain or whole-wheat pasta. Brown rice. [place]. Bulgur. Whole-grain and low-sodium cereals. Pita bread. Low-fat, low-sodium crackers. Whole-wheat flour tortillas. Vegetables Fresh or frozen vegetables (raw, steamed, roasted, or grilled). Low-sodium or reduced-sodium tomato and vegetable juice. Low-sodium or reduced-sodium tomato sauce and tomato paste. Low-sodium or reduced-sodium canned vegetables. Fruits All fresh, dried, or frozen fruit. Canned fruit in natural juice (without added sugar). Meat and other protein foods Skinless chicken or [place]. Ground chicken or [place]. Pork with fat trimmed off. Fish and seafood. Egg whites. Dried beans, peas, or lentils. Unsalted nuts, nut butters, and seeds. Unsalted canned beans. Lean cuts of beef with fat trimmed off. Low-sodium, lean deli meat. Dairy Low-fat (1%) or fat-free (skim) milk. Fat-free, low-fat, or reduced-fat cheeses. Nonfat, low-sodium ricotta or cottage cheese. Low-fat or nonfat yogurt. Low-fat, low-sodium cheese. Fats and oils Soft margarine without trans fats. Vegetable oil. Low-fat, reduced-fat, or light mayonnaise and salad dressings (reduced-sodium). Canola, safflower, olive, soybean, and sunflower oils. Avocado. Seasoning and other foods Herbs. Spices. Seasoning mixes without salt. Unsalted popcorn and pretzels. Fat-free sweets. What foods are not recommended? The items listed may not be a complete list. Talk with your dietitian about what dietary choices are best for you. Grains Baked goods made with fat, such as croissants, muffins, or some breads. Dry pasta or rice meal packs. Vegetables Creamed or fried vegetables. Vegetables in a cheese sauce. Regular canned vegetables (not low-sodium or reduced-sodium). Regular canned tomato sauce and paste (not low-sodium or reduced-sodium). Regular tomato and vegetable juice (not low-sodium or reduced-sodium). Angie Fava.  Olives. Fruits Canned fruit in a light or heavy syrup. Fried fruit. Fruit in cream or butter sauce. Meat and other protein foods Fatty cuts of meat. Ribs. Fried meat. Berniece Salines. Sausage. Bologna and other processed lunch meats. Salami. Fatback. Hotdogs. Bratwurst. Salted nuts and seeds. Canned beans with added salt. Canned or smoked fish. Whole eggs or egg yolks. Chicken or [place] with skin. Dairy Whole or 2% milk, cream, and half-and-half. Whole or full-fat cream cheese. Whole-fat or sweetened yogurt. Full-fat cheese. Nondairy creamers. Whipped toppings. Processed cheese and cheese spreads. Fats and oils Butter. Stick margarine. Lard. Shortening. Ghee. Bacon fat. Tropical oils, such as coconut, palm kernel, or palm oil. Seasoning and other foods Salted popcorn and pretzels. Onion salt, garlic salt, seasoned salt, table salt, and sea salt. Worcestershire sauce. Tartar sauce. Barbecue sauce. Teriyaki sauce. Soy sauce, including reduced-sodium. Steak sauce. Canned and packaged gravies. Fish sauce. Oyster sauce. Cocktail sauce. Horseradish that you find on the shelf. Ketchup. Mustard. Meat flavorings and tenderizers. Bouillon cubes. Hot sauce and Tabasco sauce. Premade or packaged marinades. Premade or packaged taco seasonings. Relishes. Regular salad dressings. Where to find more information:  National Heart, Lung, and [place]: https://wilson-eaton.com/  American Heart Association: www.heart.org Summary  The DASH eating plan is  a healthy eating plan that has been shown to reduce high blood pressure (hypertension). It may also reduce your risk for type 2 diabetes, heart disease, and stroke.  With the DASH eating plan, you should limit salt (sodium) intake to 2,300 mg a day. If you have hypertension, you may need to reduce your sodium intake to 1,500 mg a day.  When on the DASH eating plan, aim to eat more fresh fruits and vegetables, whole grains, lean proteins, low-fat dairy, and heart-healthy  fats.  Work with your health care provider or diet and nutrition specialist (dietitian) to adjust your eating plan to your individual calorie needs. This information is not intended to replace advice given to you by your health care provider. Make sure you discuss any questions you have with your health care provider. Document Revised: 03/28/2017 Document Reviewed: 04/08/2016 Elsevier Patient Education  2020 [place].   Preventing High Cholesterol Cholesterol is a white, waxy substance similar to fat that the human body needs to help build cells. The liver makes all the cholesterol that a person's body needs. Having high cholesterol (hypercholesterolemia) increases a person's risk for heart disease and stroke. Extra (excess) cholesterol comes from the food the person eats. High cholesterol can often be prevented with diet and lifestyle changes. If you already have high cholesterol, you can control it with diet and lifestyle changes and with medicine. How can high cholesterol affect me? If you have high cholesterol, deposits (plaques) may build up on the walls of your arteries. The arteries are the blood vessels that carry blood away from your heart. Plaques make the arteries narrower and stiffer. This can limit or block blood flow and cause blood clots to form. Blood clots:  Are tiny balls of cells that form in your blood.  Can move to the heart or brain, causing a heart attack or stroke. Plaques in arteries greatly increase your risk for heart attack and stroke.Making diet and lifestyle changes can reduce your risk for these conditions that may threaten your life. What can increase my risk? This condition is more likely to develop in people who:  Eat foods that are high in saturated fat or cholesterol. Saturated fat is mostly found in: ? Foods that contain animal fat, such as red meat and some dairy products. ? Certain fatty foods made from plants, such as tropical oils.  Are  overweight.  Are not getting enough exercise.  Have a family history of high cholesterol. What actions can I take to prevent this? Nutrition   Eat less saturated fat.  Avoid trans fats (partially hydrogenated oils). These are often found in margarine and in some baked goods, fried foods, and snacks bought in packages.  Avoid precooked or cured meat, such as sausages or meat loaves.  Avoid foods and drinks that have added sugars.  Eat more fruits, vegetables, and whole grains.  Choose healthy sources of protein, such as fish, poultry, lean cuts of red meat, beans, peas, lentils, and nuts.  Choose healthy sources of fat, such as: ? Nuts. ? Vegetable oils, especially olive oil. ? Fish that have healthy fats (omega-3 fatty acids), such as mackerel or salmon. The items listed above may not be a complete list of recommended foods and beverages. Contact a dietitian for more information. Lifestyle  Lose weight if you are overweight. Losing 5-10 lb (2.3-4.5 kg) can help prevent or control high cholesterol. It can also lower your risk for diabetes and high blood pressure. Ask your health care provider  to help you with a diet and exercise plan to lose weight safely.  Do not use any products that contain nicotine or tobacco, such as cigarettes, e-cigarettes, and chewing tobacco. If you need help quitting, ask your health care provider.  Limit your alcohol intake. ? Do not drink alcohol if:  Your health care provider tells you not to drink.  You are pregnant, may be pregnant, or are planning to become pregnant. ? If you drink alcohol:  Limit how much you use to:  0-1 drink a day for women.  0-2 drinks a day for men.  Be aware of how much alcohol is in your drink. In the [place] one drink equals one 12 oz bottle of beer (355 mL), one 5 oz glass of wine (148 mL), or one 1 oz glass of hard liquor (44 mL). Activity   Get enough exercise. Each week, do at least 150 minutes of exercise  that takes a medium level of effort (moderate-intensity exercise). ? This is exercise that:  Makes your heart beat faster and makes you breathe harder than usual.  Allows you to still be able to talk. ? You could exercise in short sessions several times a day or longer sessions a few times a week. For example, on 5 days each week, you could walk fast or ride your bike 3 times a day for 10 minutes each time.  Do exercises as told by your health care provider. Medicines  In addition to diet and lifestyle changes, your health care provider may recommend medicines to help lower cholesterol. This may be a medicine to lower the amount of cholesterol your liver makes. You may need medicine if: ? Diet and lifestyle changes do not lower your cholesterol enough. ? You have high cholesterol and other risk factors for heart disease or stroke.  Take over-the-counter and prescription medicines only as told by your health care provider. General information  Manage your risk factors for high cholesterol. Talk with your health care provider about all your risk factors and how to lower your risk.  Manage other conditions that you have, such as diabetes or high blood pressure (hypertension).  Have blood tests to check your cholesterol levels at regular points in time as told by your health care provider.  Keep all follow-up visits as told by your health care provider. This is important. Where to find more information  American Heart Association: www.heart.org  National Heart, Lung, and Blood Institute: https://wilson-eaton.com/ Summary  High cholesterol increases your risk for heart disease and stroke. By keeping your cholesterol level low, you can reduce your risk for these conditions.  High cholesterol can often be prevented with diet and lifestyle changes.  Work with your health care provider to manage your risk factors, and have your blood tested regularly. This information is not intended to replace  advice given to you by your health care provider. Make sure you discuss any questions you have with your health care provider. Document Revised: 08/07/2018 Document Reviewed: 12/23/2015 Elsevier Patient Education  2020 [employer].

## 2020-04-13 NOTE — Progress Notes (Signed)
Patient presents to clinic today to establish care. Has been a few years since last regular follow-up with primary care provider. Is currently followed by Endocrinology for hypercalcemia and is being evaluated for primary hyperparathyroidism versus [place]. Notes being told by provider at work that his cholesterol was elevated.  Also notes borderline blood pressure.  States these things are checked 2-3 times a year at work.  Have been high on the last couple of checks.  Notes grandpa with heart attack in the area age cause of death.  Follow-up of hypertension hyperlipidemia but without known coronary artery disease.  Has recently been following intermittent fasting.  Tries to keep well-hydrated.  He is a social drinker.  Non-smoker.   Health Maintenance: Immunizations -- up-to-date.  No past medical history on file.  Past Surgical History:  Procedure Laterality Date  . MENISCUS REPAIR Right     Current Outpatient Medications on File Prior to Visit  Medication Sig Dispense Refill  . Multiple Vitamin (MULTIVITAMIN) tablet Take 1 tablet by mouth daily.    . Omega-3 Fatty Acids (FISH OIL BURP-LESS PO) Take by mouth.     No current facility-administered medications on file prior to visit.    No Known Allergies  Family History  Problem Relation Age of Onset  . Healthy Mother   . Healthy Father   . Healthy Brother   . Healthy Brother     Social History   Socioeconomic History  . Marital status: Married    Spouse name: Not on file  . Number of children: Not on file  . Years of education: Not on file  . Highest education level: Not on file  Occupational History  . Not on file  Tobacco Use  . Smoking status: Never Smoker  . Smokeless tobacco: Former Network engineer and Sexual Activity  . Alcohol use: Yes    Comment: occ  . Drug use: No  . Sexual activity: Not on file  Other Topics Concern  . Not on file  Social History Narrative  . Not on file   Social Determinants of  Health   Financial Resource Strain: Not on file  Food Insecurity: Not on file  Transportation Needs: Not on file  Physical Activity: Not on file  Stress: Not on file  Social Connections: Not on file  Intimate Partner Violence: Not on file   ROS Pertinent ROS are listed in the HPI.   Ht 5' 10.5" (1.791 m)   Wt 224 lb (101.6 kg)   BMI 31.69 kg/m   Physical Exam Vitals reviewed.  Constitutional:      Appearance: Normal appearance.  HENT:     Head: Normocephalic and atraumatic.     Mouth/Throat:     Mouth: Mucous membranes are moist.  Eyes:     Conjunctiva/sclera: Conjunctivae normal.     Pupils: Pupils are equal, round, and reactive to light.  Cardiovascular:     Rate and Rhythm: Normal rate and regular rhythm.     Pulses: Normal pulses.     Heart sounds: Normal heart sounds.  Pulmonary:     Effort: Pulmonary effort is normal.     Breath sounds: Normal breath sounds.  Musculoskeletal:     Cervical back: Neck supple.  Neurological:     General: No focal deficit present.     Mental Status: He is alert and oriented to person, place, and time.  Psychiatric:        Mood and Affect: Mood normal.  Recent Results (from the past 2160 hour(s))  POCT urinalysis dipstick     Status: None   Collection Time: 01/28/20  8:53 AM  Result Value Ref Range   Color, UA yellow yellow   Clarity, UA clear clear   Glucose, UA negative negative mg/dL   Bilirubin, UA negative negative   Ketones, POC UA negative negative mg/dL   Spec Grav, UA 1.020 1.010 - 1.025   Blood, UA negative negative   pH, UA 6.5 5.0 - 8.0   Protein Ur, POC negative negative mg/dL   Urobilinogen, UA 0.2 0.2 or 1.0 E.U./dL   Nitrite, UA Negative Negative   Leukocytes, UA Negative Negative  Urine Culture     Status: None   Collection Time: 01/28/20  8:53 AM   Specimen: Urine, Random  Result Value Ref Range   Specimen Description      URINE, RANDOM Performed at [facility], [address],  [place], [place] [address]    Special Requests      NONE Performed at [facility], [address], [place], [place] [address]    Culture      NO GROWTH Performed at [facility] [address], [place], [place] [address]    Report Status 01/30/2020 FINAL   CBC with Differential/Platelet     Status: Abnormal   Collection Time: 01/28/20  3:40 PM  Result Value Ref Range   WBC 15.3 (H) 3.8 - 10.8 Thousand/uL   RBC 4.71 4.20 - 5.80 Million/uL   Hemoglobin 13.5 13.2 - 17.1 g/dL   HCT 40.6 38.5 - 50.0 %   MCV 86.2 80.0 - 100.0 fL   MCH 28.7 27.0 - 33.0 pg   MCHC 33.3 32.0 - 36.0 g/dL   RDW 11.9 11.0 - 15.0 %   Platelets 323 140 - 400 Thousand/uL   MPV 10.7 7.5 - 12.5 fL   Neutro Abs 11,444 (H) 1,500 - 7,800 cells/uL   Lymphs Abs 2,800 850 - 3,900 cells/uL   Absolute Monocytes 933 200 - 950 cells/uL   Eosinophils Absolute 92 15 - 500 cells/uL   Basophils Absolute 31 0 - 200 cells/uL   Neutrophils Relative % 74.8 %   Total Lymphocyte 18.3 %   Monocytes Relative 6.1 %   Eosinophils Relative 0.6 %   Basophils Relative 0.2 %  Comprehensive metabolic panel     Status: Abnormal   Collection Time: 01/28/20  3:40 PM  Result Value Ref Range   Glucose, Bld 90 65 - 139 mg/dL    Comment: .        Non-fasting reference interval .    BUN 9 7 - 25 mg/dL   Creat 1.01 0.60 - 1.35 mg/dL   BUN/Creatinine Ratio NOT APPLICABLE 6 - 22 (calc)   Sodium 139 135 - 146 mmol/L   Potassium 4.6 3.5 - 5.3 mmol/L   Chloride 100 98 - 110 mmol/L   CO2 31 20 - 32 mmol/L   Calcium 11.5 (H) 8.6 - 10.3 mg/dL   Total Protein 7.4 6.1 - 8.1 g/dL   Albumin 4.7 3.6 - 5.1 g/dL   Globulin 2.7 1.9 - 3.7 g/dL (calc)   AG Ratio 1.7 1.0 - 2.5 (calc)   Total Bilirubin 0.4 0.2 - 1.2 mg/dL   Alkaline phosphatase (APISO) 71 36 - 130 U/L   AST 13 10 - 40 U/L   ALT 13 9 - 46 U/L  HIV Antibody (routine testing w rflx)     Status:  None   Collection Time: 01/28/20  3:40 PM  Result Value Ref Range   HIV 1&2 Ab, 4th  Generation NON-REACTIVE NON-REACTI    Comment: HIV-1 antigen and HIV-1/HIV-2 antibodies were not detected. There is no laboratory evidence of HIV infection. Marland Kitchen PLEASE NOTE: This information has been disclosed to you from records whose confidentiality may be protected by state law.  If your state requires such protection, then the state law prohibits you from making any further disclosure of the information without the specific written consent of the person to whom it pertains, or as otherwise permitted by law. A general authorization for the release of medical or other information is NOT sufficient for this purpose. . For additional information please refer to http://education.questdiagnostics.com/faq/FAQ106 (This link is being provided for informational/ educational purposes only.) . Marland Kitchen The performance of this assay has not been clinically validated in patients less than 38 years old. .   Hepatitis panel, acute     Status: None   Collection Time: 01/28/20  3:40 PM  Result Value Ref Range   Hep A IgM NON-REACTIVE NON-REACTI   Hepatitis B Surface Ag NON-REACTIVE NON-REACTI   Hep B C IgM NON-REACTIVE NON-REACTI   Hepatitis C Ab NON-REACTIVE NON-REACTI   SIGNAL TO CUT-OFF 0.01 <1.00    Comment: . HCV antibody was non-reactive. There is no laboratory  evidence of HCV infection. . In most cases, no further action is required. However, if recent HCV exposure is suspected, a test for HCV RNA (test code [phone]) is suggested. . For additional information please refer to http://education.questdiagn[facility].com/faq/FAQ22v1 (This link is being provided for informational/ educational purposes only.) . Marland Kitchen For additional information, please refer to  http://education.questdiagn[facility].com/faq/FAQ202  (This link is being provided for informational/ educational purposes only.) .   RPR     Status: None   Collection Time: 01/28/20  3:40 PM  Result Value Ref Range   RPR Ser Ql  NON-REACTIVE NON-REACTI  C. trachomatis/N. gonorrhoeae RNA     Status: None   Collection Time: 01/28/20  3:40 PM   Specimen: Urine  Result Value Ref Range   C. trachomatis RNA, TMA NOT DETECTED NOT DETECT   N. gonorrhoeae RNA, TMA NOT DETECTED NOT DETECT    Comment: The analytical performance characteristics of this assay, when used to test SurePath(TM) specimens have been determined by [employer]. The modifications have not been cleared or approved by the FDA. This assay has been validated pursuant to the CLIA regulations and is used for clinical purposes. . For additional information, please refer to https://education.questdiagn[facility].com/faq/FAQ154 (This link is being provided for information/ educational purposes only.) .   PTH, Intact and Calcium     Status: Abnormal   Collection Time: 02/04/20  9:59 AM  Result Value Ref Range   PTH 68 (H) 14 - 64 pg/mL    Comment: . Interpretive Guide    Intact PTH           Calcium ------------------    ----------           ------- Normal Parathyroid    Normal               Normal Hypoparathyroidism    Low or Low Normal    Low Hyperparathyroidism    Primary            Normal or High       High    Secondary          High  Normal or Low    Tertiary           High                 High Non-Parathyroid    Hypercalcemia      Low or Low Normal    High .    Calcium 11.1 (H) 8.6 - 10.3 mg/dL    Assessment/Plan: 1. Family history of early CAD EKG today with normal sinus rhythm.  Patient showed/lab results via his phone with LDL at 152 and HDL 38.  Triglycerides within normal range.  Will attempt to get printed out records of this to abstracted into his chart.  Discussed proper diet and exercise.  We will keep an eye on blood pressure and cholesterol.  Plan for stress testing late thirties early forties. - EKG 12-Lead  2. Elevated BP without diagnosis of hypertension Asymptomatic.  Discussed dietary and exercise  recommendations to help promote weight loss.  Follow-up in 3 months.  If climbing will start medication while continuing to work on therapeutic lifestyle changes.  3. Hyperlipidemia, unspecified hyperlipidemia type LDL of 152.  Given young age and lack of comorbidities, medication is not permanent at present.  Are working very hard on diet for weight loss.  Exercise regimen reviewed.  Patient to start omega-3's.  Plan for follow-up in office in 3 months to reevaluate lipids.  This visit occurred during the SARS-CoV-2 public health emergency.  Safety protocols were in place, including screening questions prior to the visit, additional usage of staff PPE, and extensive cleaning of exam room while observing appropriate contact time as indicated for disinfecting solutions.     Leeanne Rio, PA-C

## 2020-04-27 DIAGNOSIS — D225 Melanocytic nevi of trunk: Secondary | ICD-10-CM | POA: Diagnosis not present

## 2020-04-27 DIAGNOSIS — D2230 Melanocytic nevi of unspecified part of face: Secondary | ICD-10-CM | POA: Diagnosis not present

## 2020-04-27 DIAGNOSIS — D224 Melanocytic nevi of scalp and neck: Secondary | ICD-10-CM | POA: Diagnosis not present

## 2020-04-27 DIAGNOSIS — L814 Other melanin hyperpigmentation: Secondary | ICD-10-CM | POA: Diagnosis not present

## 2020-05-01 ENCOUNTER — Encounter: Payer: Self-pay | Admitting: Physician Assistant

## 2020-05-02 ENCOUNTER — Encounter: Payer: Self-pay | Admitting: Physician Assistant

## 2020-05-02 ENCOUNTER — Telehealth (INDEPENDENT_AMBULATORY_CARE_PROVIDER_SITE_OTHER): Payer: BC Managed Care – PPO | Admitting: Physician Assistant

## 2020-05-02 DIAGNOSIS — M542 Cervicalgia: Secondary | ICD-10-CM | POA: Diagnosis not present

## 2020-05-02 NOTE — Patient Instructions (Signed)
Instructions sent to patients MyChart.

## 2020-05-02 NOTE — Progress Notes (Signed)
Virtual Visit via Telephone Note  I connected with Marco Reynolds on 05/02/20 at  8:30 AM EST by telephone and verified that I am speaking with the correct person using two identifiers. Unable to connect via video platform due to Epic/computer downtime.   Location: Patient: Home Provider: [facility]   I discussed the limitations, risks, security and privacy concerns of performing an evaluation and management service by telephone and the availability of in person appointments. I also discussed with the patient that there may be a patient responsible charge related to this service. The patient expressed understanding and agreed to proceed.  History of Present Illness: Patient presents today complaining of 1 week of external soreness of throat, solely right-sided.  Notes soreness started right adjacent to his Adam's apple and is tender to the touch.  Now notes some occasional radiation of pain to his jaw.  Denies any fever or chills.  Denies any ear pain, tooth pain, nasal congestion, sinus pressure, postnasal drip or other URI symptoms.  Is able to swallow without difficulty.  Denies any worsening of pain with eating or moving jaw.  Notes he does lift up quite a bit of weight but denies any known trauma or injury.  Has not taken anything for his symptoms.   Observations/Objective: No labored breathing.  Speech is clear and coherent with logical content.  Patient is alert and oriented at baseline.   Assessment and Plan: 1. Anterior neck pain Pression mild muscle strain versus milder thyroiditis.  No alarm signs or symptoms present.  No URI symptoms present.  Want him to avoid any upper body resistance training or strain to the neck.  Start alternation of ibuprofen and Tylenol for pain and inflammation.  Supportive measures reviewed with patient.  Strict return to office instructions given.  If no significant improvement over the next 48 hours or if anything new develops he is to be seen  in office ASAP so we can do an in-depth examination and determine true source of symptoms.  Patient voiced understanding agreement with plan.   Follow Up Instructions:    I discussed the assessment and treatment plan with the patient. The patient was provided an opportunity to ask questions and all were answered. The patient agreed with the plan and demonstrated an understanding of the instructions.   The patient was advised to call back or seek an in-person evaluation if the symptoms worsen or if the condition fails to improve as anticipated.  I provided 12 minutes of non-face-to-face time during this encounter.   Piedad Climes, PA-C

## 2020-05-02 NOTE — Telephone Encounter (Signed)
Please call patient to schedule appt -- would start with video visit.

## 2020-05-05 ENCOUNTER — Telehealth: Payer: Self-pay | Admitting: Family Medicine

## 2020-05-05 MED ORDER — OSELTAMIVIR PHOSPHATE 75 MG PO CAPS: 75.0000 mg | ORAL_CAPSULE | Freq: Every day | ORAL | 0 refills | Status: DC

## 2020-05-05 NOTE — Telephone Encounter (Signed)
Son tested positive for influenza A today.  We will treat prophylactically with Tamiflu.  Prescription sent to [facility] in [place].

## 2020-05-19 ENCOUNTER — Other Ambulatory Visit (INDEPENDENT_AMBULATORY_CARE_PROVIDER_SITE_OTHER): Payer: BC Managed Care – PPO

## 2020-05-19 ENCOUNTER — Ambulatory Visit: Payer: BC Managed Care – PPO | Admitting: Physician Assistant

## 2020-05-19 ENCOUNTER — Other Ambulatory Visit: Payer: Self-pay

## 2020-05-19 ENCOUNTER — Encounter: Payer: Self-pay | Admitting: Physician Assistant

## 2020-05-19 VITALS — BP 120/80 | HR 69 | Temp 98.3°F | Resp 16 | Ht 70.5 in | Wt 221.0 lb

## 2020-05-19 DIAGNOSIS — E213 Hyperparathyroidism, unspecified: Secondary | ICD-10-CM

## 2020-05-19 DIAGNOSIS — K219 Gastro-esophageal reflux disease without esophagitis: Secondary | ICD-10-CM | POA: Diagnosis not present

## 2020-05-19 LAB — VITAMIN D 25 HYDROXY (VIT D DEFICIENCY, FRACTURES): VITD: 27.3 ng/mL — ABNORMAL LOW (ref 30.00–100.00)

## 2020-05-19 LAB — BASIC METABOLIC PANEL
BUN: 13 mg/dL (ref 6–23)
CO2: 31 mEq/L (ref 19–32)
Calcium: 9.6 mg/dL (ref 8.4–10.5)
Chloride: 100 mEq/L (ref 96–112)
Creatinine, Ser: 0.95 mg/dL (ref 0.40–1.50)
GFR: 106.75 mL/min (ref >60.00)
Glucose, Bld: 95 mg/dL (ref 70–99)
Potassium: 3.8 mEq/L (ref 3.5–5.1)
Sodium: 137 mEq/L (ref 135–145)

## 2020-05-19 LAB — ALBUMIN: Albumin: 4.9 g/dL (ref 3.5–5.2)

## 2020-05-19 MED ORDER — PANTOPRAZOLE SODIUM 40 MG PO TBEC: 40.0000 mg | DELAYED_RELEASE_TABLET | Freq: Every day | ORAL | 3 refills | Status: DC

## 2020-05-19 NOTE — Progress Notes (Signed)
Patient presents to clinic today c/o recurrence of some discomfort in neck over the past week, noting this seems different than last episode which was felt to be muscular in nature. Notes that he just got over the flu. Had some coughing spells while sick which have resolved. Notes now having a frequent need to clear throat with a sensation of something sitting in his throat. Denies difficulty swallowing water or foods. Denies choking. Denies nausea or vomiting. Sometimes with sensation of something coming up in the throat. .   Past Medical History:  Diagnosis Date  . Allergy     Current Outpatient Medications on File Prior to Visit  Medication Sig Dispense Refill  . Multiple Vitamin (MULTIVITAMIN) tablet Take 1 tablet by mouth daily.    . Omega-3 Fatty Acids (FISH OIL BURP-LESS PO) Take by mouth.     No current facility-administered medications on file prior to visit.    No Known Allergies  Family History  Problem Relation Age of Onset  . Healthy Mother   . Gestational diabetes Mother   . Miscarriages / [place] Mother   . Healthy Father   . Hyperlipidemia Father   . Hypertension Father   . Kidney disease Father   . Gout Father   . Healthy Brother   . Healthy Brother     Social History   Socioeconomic History  . Marital status: Married    Spouse name: Not on file  . Number of children: Not on file  . Years of education: Not on file  . Highest education level: Not on file  Occupational History  . Not on file  Tobacco Use  . Smoking status: Never Smoker  . Smokeless tobacco: Former Network engineer and Sexual Activity  . Alcohol use: Yes    Alcohol/week: 2.0 standard drinks    Types: 2 Cans of beer per week    Comment: occ  . Drug use: Never  . Sexual activity: Yes    Birth control/protection: None  Other Topics Concern  . Not on file  Social History Narrative  . Not on file   Social Determinants of Health   Financial Resource Strain: Not on file  Food  Insecurity: Not on file  Transportation Needs: Not on file  Physical Activity: Not on file  Stress: Not on file  Social Connections: Not on file   Review of Systems - See HPI.  All other ROS are negative.  BP 120/80   Pulse 69   Temp 98.3 F (36.8 C) (Temporal)   Resp 16   Ht 5' 10.5" (1.791 m)   Wt 221 lb (100.2 kg)   SpO2 99%   BMI 31.26 kg/m   Physical Exam Vitals reviewed.  Constitutional:      Appearance: Normal appearance.  HENT:     Head: Normocephalic and atraumatic.     Right Ear: Tympanic membrane normal.     Left Ear: Tympanic membrane normal.     Nose: Nose normal.     Mouth/Throat:     Mouth: Mucous membranes are moist.  Eyes:     Conjunctiva/sclera: Conjunctivae normal.     Pupils: Pupils are equal, round, and reactive to light.  Neck:     Thyroid: No thyroid mass, thyromegaly or thyroid tenderness.     Trachea: Trachea normal. No tracheal tenderness.  Cardiovascular:     Rate and Rhythm: Normal rate and regular rhythm.     Pulses: Normal pulses.     Heart sounds: Normal  heart sounds.  Pulmonary:     Effort: Pulmonary effort is normal.  Abdominal:     General: Bowel sounds are normal. There is no distension.     Palpations: Abdomen is soft.     Tenderness: There is no abdominal tenderness.  Musculoskeletal:     Cervical back: Neck supple. No pain with movement, spinous process tenderness or muscular tenderness.  Lymphadenopathy:     Cervical: No cervical adenopathy.  Neurological:     Mental Status: He is alert.  Psychiatric:        Mood and Affect: Mood normal.    Assessment/Plan: 1. Gastroesophageal reflux disease without esophagitis Seems consistent with mild silent reflux and esophageal inflammation with globus sensation. Dietary recommendations reviewed. Will start trial of PPI - Protonix. Follow-up if not resolving. Would warrant ENT evaluation.   - pantoprazole (PROTONIX) 40 MG tablet; Take 1 tablet (40 mg total) by mouth daily.   Dispense: 30 tablet; Refill: 3  This visit occurred during the SARS-CoV-2 public health emergency.  Safety protocols were in place, including screening questions prior to the visit, additional usage of staff PPE, and extensive cleaning of exam room while observing appropriate contact time as indicated for disinfecting solutions.     Leeanne Rio, PA-C

## 2020-05-19 NOTE — Patient Instructions (Signed)
Please take the Protonix daily as directed for the next 10 days. Follow dietary recommendations below. Let me know if you have not noted a substantial improvement within the next week. If not I would want to have you see an ENT provider.    Food Choices for Gastroesophageal Reflux Disease, Adult When you have gastroesophageal reflux disease (GERD), the foods you eat and your eating habits are very important. Choosing the right foods can help ease your discomfort. Think about working with a food expert (dietitian) to help you make good choices. What are tips for following this plan? Reading food labels  Look for foods that are low in saturated fat. Foods that may help with your symptoms include: ? Foods that have less than 5% of daily value (DV) of fat. ? Foods that have 0 grams of trans fat. Cooking  Do not fry your food.  Cook your food by baking, steaming, grilling, or broiling. These are all methods that do not need a lot of fat for cooking.  To add flavor, try to use herbs that are low in spice and acidity. Meal planning  Choose healthy foods that are low in fat, such as: ? Fruits and vegetables. ? Whole grains. ? Low-fat dairy products. ? Lean meats, fish, and poultry.  Eat small meals often instead of eating 3 large meals each day. Eat your meals slowly in a place where you are relaxed. Avoid bending over or lying down until 2-3 hours after eating.  Limit high-fat foods such as fatty meats or fried foods.  Limit your intake of fatty foods, such as oils, butter, and shortening.  Avoid the following as told by your doctor: ? Foods that cause symptoms. These may be different for different people. Keep a food diary to keep track of foods that cause symptoms. ? Alcohol. ? Drinking a lot of liquid with meals. ? Eating meals during the 2-3 hours before bed.   Lifestyle  Stay at a healthy weight. Ask your doctor what weight is healthy for you. If you need to lose weight, work  with your doctor to do so safely.  Exercise for at least 30 minutes on 5 or more days each week, or as told by your doctor.  Wear loose-fitting clothes.  Do not smoke or use any products that contain nicotine or tobacco. If you need help quitting, ask your doctor.  Sleep with the head of your bed higher than your feet. Use a wedge under the mattress or blocks under the bed frame to raise the head of the bed.  Chew sugar-free gum after meals. What foods should eat? Eat a healthy, well-balanced diet of fruits, vegetables, whole grains, low-fat dairy products, lean meats, fish, and poultry. Each person is different. Foods that may cause symptoms in one person may not cause any symptoms in another person. Work with your doctor to find foods that are safe for you. The items listed above may not be a complete list of what you can eat and drink. Contact a food expert for more options.   What foods should I avoid? Limiting some of these foods may help in managing the symptoms of GERD. Everyone is different. Talk with a food expert or your doctor to help you find the exact foods to avoid, if any. Fruits Any fruits prepared with added fat. Any fruits that cause symptoms. For some people, this may include citrus fruits, such as oranges, grapefruit, pineapple, and lemons. Vegetables Deep-fried vegetables. [place] fries. Any vegetables  prepared with added fat. Any vegetables that cause symptoms. For some people, this may include tomatoes and tomato products, chili peppers, onions and garlic, and horseradish. Grains Pastries or quick breads with added fat. Meats and other proteins High-fat meats, such as fatty beef or pork, hot dogs, ribs, ham, sausage, salami, and bacon. Fried meat or protein, including fried fish and fried chicken. Nuts and nut butters, in large amounts. Dairy Whole milk and chocolate milk. Sour cream. Cream. Ice cream. Cream cheese. Milkshakes. Fats and oils Butter. Margarine.  Shortening. Ghee. Beverages Coffee and tea, with or without caffeine. Carbonated beverages. Sodas. Energy drinks. Fruit juice made with acidic fruits, such as orange or grapefruit. Tomato juice. Alcoholic drinks. Sweets and desserts Chocolate and cocoa. Donuts. Seasonings and condiments Pepper. Peppermint and spearmint. Added salt. Any condiments, herbs, or seasonings that cause symptoms. For some people, this may include curry, hot sauce, or vinegar-based salad dressings. The items listed above may not be a complete list of what you should not eat and drink. Contact a food expert for more options. Questions to ask your doctor Diet and lifestyle changes are often the first steps that are taken to manage symptoms of GERD. If diet and lifestyle changes do not help, talk with your doctor about taking medicines. Where to find more information  International Foundation for Gastrointestinal Disorders: aboutgerd.org Summary  When you have GERD, food and lifestyle choices are very important in easing your symptoms.  Eat small meals often instead of 3 large meals a day. Eat your meals slowly and in a place where you are relaxed.  Avoid bending over or lying down until 2-3 hours after eating.  Limit high-fat foods such as fatty meats or fried foods. This information is not intended to replace advice given to you by your health care provider. Make sure you discuss any questions you have with your health care provider. Document Revised: 10/25/2019 Document Reviewed: 10/25/2019 Elsevier Patient Education  [place].

## 2020-05-22 LAB — CALCIUM, URINE, 24 HOUR: Calcium, 24H Urine: 76 mg/24 h

## 2020-05-22 LAB — PARATHYROID HORMONE, INTACT (NO CA): PTH: 28 pg/mL (ref 14–64)

## 2020-05-22 LAB — CREATININE, URINE, 24 HOUR: Creatinine, 24H Ur: 2.62 g/(24.h) — ABNORMAL HIGH (ref 0.50–2.15)

## 2020-05-22 LAB — EXTRA URINE SPECIMEN

## 2020-05-26 ENCOUNTER — Other Ambulatory Visit: Payer: Self-pay

## 2020-06-19 ENCOUNTER — Other Ambulatory Visit: Payer: Self-pay

## 2020-06-21 ENCOUNTER — Ambulatory Visit: Payer: BC Managed Care – PPO | Admitting: Internal Medicine

## 2020-06-21 ENCOUNTER — Encounter: Payer: Self-pay | Admitting: Internal Medicine

## 2020-06-21 ENCOUNTER — Other Ambulatory Visit: Payer: Self-pay

## 2020-06-21 VITALS — BP 122/80 | HR 70 | Ht 70.5 in | Wt 226.0 lb

## 2020-06-21 DIAGNOSIS — E213 Hyperparathyroidism, unspecified: Secondary | ICD-10-CM

## 2020-06-21 NOTE — Progress Notes (Unsigned)
Name: Marco Reynolds  MRN/ DOB: [id number], 05/02/1990    Age/ Sex: 30 y.o., male     PCP: Delorse Limber   Reason for Endocrinology Evaluation: Hyperparathyroidism      Initial Endocrinology Clinic Visit: 03/15/2020    PATIENT IDENTIFIER: Marco Reynolds is a 30 y.o., male with a past medical history of unremarkable . He has followed with [facility] since 03/15/2020 for consultative assistance with management of his hyperparathyroidism .   HISTORICAL SUMMARY: The patient was first noted with hypercalcemia in 2019 , with a serum Max of 11.5 mg/dL ( no concomitance Albumin at the time ) but historically has high Albumin with a max of 5.1 g/dL.  His parathyroid hormone was elevated in 01/2020 at 68 pg/mL  No concomitant vitamin D.   Alk. Phos has always been normal    He has not been on Vitamin D, lithium or HCTZ in the past   His 24- hr urine calcium was low at 76 with Ca/Cr ratio of 0.0028    He denies history of kidney stones, liver or renal disease.  SUBJECTIVE:   Today (06/21/2020):  Marco Reynolds is here for a follow up on hypercalcemia and hyperparathyroidism.   Has been noted with polydipsia  Denies polyuria  Vitamin D 2 000 iu daily   HISTORY:  Past Medical History:  Past Medical History:  Diagnosis Date  . Allergy     Past Surgical History:  Past Surgical History:  Procedure Laterality Date  . MENISCUS REPAIR Right      Social History:  reports that he has never smoked. He has quit using smokeless tobacco. He reports current alcohol use of about 2.0 standard drinks of alcohol per week. He reports that he does not use drugs. Family History:  Family History  Problem Relation Age of Onset  . Healthy Mother   . Gestational diabetes Mother   . Miscarriages / [place] Mother   . Healthy Father   . Hyperlipidemia Father   . Hypertension Father   . Kidney disease Father   . Gout Father   . Healthy Brother   .  Healthy Brother       HOME MEDICATIONS: Allergies as of 06/21/2020   No Known Allergies     Medication List       Accurate as of June 21, 2020  1:03 PM. If you have any questions, ask your nurse or doctor.        FISH OIL BURP-LESS PO Take by mouth.   multivitamin tablet Take 1 tablet by mouth daily.   pantoprazole 40 MG tablet Commonly known as: PROTONIX Take 1 tablet (40 mg total) by mouth daily.         OBJECTIVE:   PHYSICAL EXAM: VS: BP 122/80   Pulse 70   Ht 5' 10.5" (1.791 m)   Wt 226 lb (102.5 kg)   SpO2 98%   BMI 31.97 kg/m    EXAM: General: Pt appears well and is in NAD  Neck: General: Supple without adenopathy. Thyroid: Thyroid size normal.  No goiter or nodules appreciated. No thyroid bruit.  Lungs: Clear with good BS bilat with no rales, rhonchi, or wheezes  Heart: Auscultation: RRR.  Abdomen: Normoactive bowel sounds, soft, nontender, without masses or organomegaly palpable  Extremities:  BL LE: No pretibial edema normal ROM and strength.  Skin: Hair: Texture and amount normal with gender appropriate distribution Skin Inspection: No rashes Skin Palpation: Skin temperature, texture, and  thickness normal to palpation  Neuro: Cranial nerves: II - XII grossly intact  Cerebellar: Normal coordination and movement; no tremor Motor: Normal strength throughout DTRs: 2+ and symmetric in UE without delay in relaxation phase  Mental Status: Judgment, insight: Intact Memory: Intact for recent and remote events Mood and affect: No depression, anxiety, or agitation     DATA REVIEWED:  Results for Marco Reynolds, Marco "CRAIG" (MRN [id number]) as of 06/23/2020 14:14  Ref. Range 06/21/2020 16:48  Sodium Latest Ref Range: 135 - 145 mEq/L 140  Potassium Latest Ref Range: 3.5 - 5.1 mEq/L 4.2  Chloride Latest Ref Range: 96 - 112 mEq/L 101  CO2 Latest Ref Range: 19 - 32 mEq/L 29  Glucose Latest Ref Range: 70 - 99 mg/dL 85  BUN Latest Ref Range: 6 -  23 mg/dL 15  Creatinine Latest Ref Range: 0.40 - 1.50 mg/dL 0.99  Calcium Latest Ref Range: 8.4 - 10.5 mg/dL 10.0  Calcium Ionized Latest Ref Range: 4.8 - 5.6 mg/dL 5.09  Phosphorus Latest Ref Range: 2.3 - 4.6 mg/dL 4.7 (H)  Alkaline Phosphatase Latest Ref Range: 39 - 117 U/L 68  Albumin Latest Ref Range: 3.5 - 5.2 g/dL 4.9  AST Latest Ref Range: 0 - 37 U/L 22  ALT Latest Ref Range: 0 - 53 U/L 24  Total Protein Latest Ref Range: 6.0 - 8.3 g/dL 7.6  Total Bilirubin Latest Ref Range: 0.2 - 1.2 mg/dL 0.4  GFR Latest Ref Range: >60.00 mL/min 101.53  VITD Latest Ref Range: 30.00 - 100.00 ng/mL 31.72  PTH, Intact Latest Ref Range: 14 - 64 pg/mL 55     ASSESSMENT / PLAN / RECOMMENDATIONS:   1. Secondary Hyperparathyroidism :  - He had an elevated PTH in 01/2020 but this has normalized after taking Vitamin D for a few months - Repeat Vitamin D levels have been normal    Continue Vitamin D 2000 iu daily     2. History of Hypercalcemia :   - We have not been able to capture true hypercalcemia . His serum calciums that have concomitant albumin , have come back normal with correction, some of them we were unable to correct due to lack of albumin .  - repeat serum calcium and ionized calcium have been normal  - Of note, we had stopped his MVI on his visit with me, so I am going to recommend restarting the multivitamin  ~3 months before he seems me, to see if we can capture any true hypercalcemia   - His 24- hr urine calcium was low at 76 with Ca/Cr ratio of 0.0028   F/U in 6 months  Signed electronically by: Mack Guise, MD  [facility]  [facility] [address] [place], [place] [address] Phone: [phone] FAX: [phone]      CC: Delorse Limber [address] [place] [address] Phone: [phone]  Fax: [phone]   Return to Endocrinology clinic as below: Future Appointments  Date Time Provider  [place]  06/21/2020  3:20 PM Shamleffer, Melanie Crazier, MD [facility] None

## 2020-06-22 LAB — COMPREHENSIVE METABOLIC PANEL
ALT: 24 U/L (ref 0–53)
AST: 22 U/L (ref 0–37)
Albumin: 4.9 g/dL (ref 3.5–5.2)
Alkaline Phosphatase: 68 U/L (ref 39–117)
BUN: 15 mg/dL (ref 6–23)
CO2: 29 mEq/L (ref 19–32)
Calcium: 10 mg/dL (ref 8.4–10.5)
Chloride: 101 mEq/L (ref 96–112)
Creatinine, Ser: 0.99 mg/dL (ref 0.40–1.50)
GFR: 101.53 mL/min (ref >60.00)
Glucose, Bld: 85 mg/dL (ref 70–99)
Potassium: 4.2 mEq/L (ref 3.5–5.1)
Sodium: 140 mEq/L (ref 135–145)
Total Bilirubin: 0.4 mg/dL (ref 0.2–1.2)
Total Protein: 7.6 g/dL (ref 6.0–8.3)

## 2020-06-22 LAB — CALCIUM, IONIZED: Calcium, Ion: 5.09 mg/dL (ref 4.8–5.6)

## 2020-06-22 LAB — PHOSPHORUS: Phosphorus: 4.7 mg/dL — ABNORMAL HIGH (ref 2.3–4.6)

## 2020-06-22 LAB — VITAMIN D 25 HYDROXY (VIT D DEFICIENCY, FRACTURES): VITD: 31.72 ng/mL (ref 30.00–100.00)

## 2020-06-22 LAB — PARATHYROID HORMONE, INTACT (NO CA): PTH: 55 pg/mL (ref 14–64)

## 2020-06-23 ENCOUNTER — Encounter: Payer: Self-pay | Admitting: Internal Medicine

## 2020-06-29 ENCOUNTER — Encounter: Payer: Self-pay | Admitting: Internal Medicine

## 2020-07-12 ENCOUNTER — Ambulatory Visit: Payer: BC Managed Care – PPO | Admitting: Physician Assistant

## 2020-09-13 ENCOUNTER — Ambulatory Visit: Payer: BC Managed Care – PPO | Admitting: Family Medicine

## 2020-09-13 ENCOUNTER — Other Ambulatory Visit: Payer: Self-pay

## 2020-09-13 ENCOUNTER — Encounter: Payer: Self-pay | Admitting: Family Medicine

## 2020-09-13 VITALS — BP 132/76 | HR 73 | Temp 98.2°F | Resp 17 | Ht 70.5 in | Wt 223.6 lb

## 2020-09-13 DIAGNOSIS — K219 Gastro-esophageal reflux disease without esophagitis: Secondary | ICD-10-CM

## 2020-09-13 DIAGNOSIS — E785 Hyperlipidemia, unspecified: Secondary | ICD-10-CM | POA: Diagnosis not present

## 2020-09-13 DIAGNOSIS — E211 Secondary hyperparathyroidism, not elsewhere classified: Secondary | ICD-10-CM

## 2020-09-13 NOTE — Progress Notes (Signed)
Subjective:  Patient ID: Marco Reynolds, male    DOB: [birth year]  Age: 30 y.o. MRN: [id number]  CC:  Chief Complaint  Patient presents with  . Establish Care    Pt doing well reports he has stopped Protonix a while ago, no concerns, did bring most recent lab work from his job that recommends a statin therapy.     HPI [name] presents for   New patient establish care, previously under the care of [facility], [place]. Last note reviewed from January.  Suspected silent reflux/GERD, some upper airway symptoms likely at that time.  Started on Protonix- took for about 10 days. Symptoms resolved and has not returned. Eating cleaner. No heartburn.   Hyperparathyoidism: Secondary. Improved with Vit D supplement. Treated by endocrine - Dr. Kelton Pillar. Restarted MVI recently and will repeat testing to see if issue with MVI.   Results for orders placed or performed in visit on 06/21/20  Calcium, ionized  Result Value Ref Range   Calcium, Ion 5.09 4.8 - 5.6 mg/dL  Phosphorus  Result Value Ref Range   Phosphorus 4.7 (H) 2.3 - 4.6 mg/dL  Parathyroid hormone, intact (no Ca)  Result Value Ref Range   PTH 55 14 - 64 pg/mL  VITAMIN D 25 Hydroxy (Vit-D Deficiency, Fractures)  Result Value Ref Range   VITD 31.72 30.00 - 100.00 ng/mL  Comprehensive metabolic panel  Result Value Ref Range   Sodium 140 135 - 145 mEq/L   Potassium 4.2 3.5 - 5.1 mEq/L   Chloride 101 96 - 112 mEq/L   CO2 29 19 - 32 mEq/L   Glucose, Bld 85 70 - 99 mg/dL   BUN 15 6 - 23 mg/dL   Creatinine, Ser 0.99 0.40 - 1.50 mg/dL   Total Bilirubin 0.4 0.2 - 1.2 mg/dL   Alkaline Phosphatase 68 39 - 117 U/L   AST 22 0 - 37 U/L   ALT 24 0 - 53 U/L   Total Protein 7.6 6.0 - 8.3 g/dL   Albumin 4.9 3.5 - 5.2 g/dL   GFR 101.53 >60.00 mL/min   Calcium 10.0 8.4 - 10.5 mg/dL    Hyperlipidemia: No current medication/statin.  He does bring blood work from May 10, performed at [employer] through  work - has seen NP at work.  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   FH of HLD - father with HLD on meds since ace 30's. Father age 67 - no heart issues.  Paternal GF with early CAD in 30-40's (smoker, alcohol as well).   Exercising daily, [place] 4-5 days per week. Diet has been difficult with 1yo and new child on way.occasional alcohol - rare.  No tobacco.    Total cholesterol 235, triglycerides 210, LDL 153, HDL 45.  CMP normal including LFTs, glucose 91.  Vitamin D 32, B12 303  A1c 5.3 in December 2021.  CBC within normal limits on 12/28/2019.  TSH also normal at 2.10 on 12/28/2019.  Calcium borderline elevated on previous labs but normal on most recent CMP in May.   Lab Results  Component Value Date   CHOL 238 (H) 04/01/2018   HDL 44 04/01/2018   LDLCALC 159 (H) 04/01/2018   TRIG 176 (H) 04/01/2018   CHOLHDL 5.4 (H) 04/01/2018   Lab Results  Component Value Date   ALT 24 06/21/2020   AST 22 06/21/2020   ALKPHOS 68 06/21/2020   BILITOT 0.4 06/21/2020     History Patient Active Problem List  Diagnosis Date Noted  . Hypercalcemia 02/04/2020  . Epididymitis, left 01/28/2020  . Effusion of knee joint right 07/17/2018  . Left ankle pain 07/17/2018  . Obesity (BMI 30.0-34.9) 04/01/2018  . Deviated nasal septum 07/21/2017   Past Medical History:  Diagnosis Date  . Allergy    Past Surgical History:  Procedure Laterality Date  . MENISCUS REPAIR Right    No Known Allergies Prior to Admission medications   Medication Sig Start Date End Date Taking? Authorizing Provider  Omega-3 Fatty Acids (FISH OIL BURP-LESS PO) Take by mouth.   Yes [provider]  VITAMIN D PO Take 2,000 Units by mouth daily.   Yes [provider]   Social History   Socioeconomic History  . Marital status: Married    Spouse name: Not on file  . Number of children: Not on file  . Years of education: Not on file  . Highest education level: Not on file  Occupational History  .  Not on file  Tobacco Use  . Smoking status: Never Smoker  . Smokeless tobacco: Former Network engineer and Sexual Activity  . Alcohol use: Yes    Alcohol/week: 2.0 standard drinks    Types: 2 Cans of beer per week    Comment: occ  . Drug use: Never  . Sexual activity: Yes    Birth control/protection: None  Other Topics Concern  . Not on file  Social History Narrative  . Not on file   Social Determinants of Health   Financial Resource Strain: Not on file  Food Insecurity: Not on file  Transportation Needs: Not on file  Physical Activity: Not on file  Stress: Not on file  Social Connections: Not on file  Intimate Partner Violence: Not on file    Review of Systems  Respiratory: Negative for shortness of breath.   Cardiovascular: Negative for chest pain.   Per HPI.    Objective:   Vitals:   09/13/20 0915  BP: 132/76  Pulse: 73  Resp: 17  Temp: 98.2 F (36.8 C)  TempSrc: Temporal  SpO2: 98%  Weight: 223 lb 9.6 oz (101.4 kg)  Height: 5' 10.5" (1.791 m)     Physical Exam Vitals reviewed.  Constitutional:      Appearance: He is well-developed.  HENT:     Head: Normocephalic and atraumatic.  Eyes:     Pupils: Pupils are equal, round, and reactive to light.  Neck:     Vascular: No carotid bruit or JVD.  Cardiovascular:     Rate and Rhythm: Normal rate and regular rhythm.     Heart sounds: Normal heart sounds. No murmur heard.   Pulmonary:     Effort: Pulmonary effort is normal.     Breath sounds: Normal breath sounds. No rales.  Skin:    General: Skin is warm and dry.  Neurological:     Mental Status: He is alert and oriented to person, place, and time.      Assessment & Plan:  [name] is a 30 y.o. male . Hyperlipidemia, unspecified hyperlipidemia type  -Option of statin discussed, but plans to work on diet/exercise.  Potentially could consider coronary calcium scoring but at his age not sure that would be necessary.  Intermittent  statin also a possibility.  Repeat labs in 6 months.  Secondary hyperparathyroidism, non-renal ([place])   -Continue follow-up with endocrinology.  Gastroesophageal reflux disease without esophagitis  -No current symptoms, trigger avoidance discussed with option of H2 blocker versus  PPI if recurrence.  No orders of the defined types were placed in this encounter.  Patient Instructions   If throat symptoms return, can try pepcid over the counter intermittently if needed. See foods to avoid below.   With grandfather's history, a low dose cholesterol pill could be helpful but not absolutely needed at this time. Continue to watch diet and meal prep if possible. Continue exercise. We have option of coronary calcium scoring at some point to help decide on meds if needed. Recheck cholesterol in 6 months.   Food Choices for Gastroesophageal Reflux Disease, Adult When you have gastroesophageal reflux disease (GERD), the foods you eat and your eating habits are very important. Choosing the right foods can help ease your discomfort. Think about working with a food expert (dietitian) to help you make good choices. What are tips for following this plan? Reading food labels  Look for foods that are low in saturated fat. Foods that may help with your symptoms include: ? Foods that have less than 5% of daily value (DV) of fat. ? Foods that have 0 grams of trans fat. Cooking  Do not fry your food.  Cook your food by baking, steaming, grilling, or broiling. These are all methods that do not need a lot of fat for cooking.  To add flavor, try to use herbs that are low in spice and acidity. Meal planning  Choose healthy foods that are low in fat, such as: ? Fruits and vegetables. ? Whole grains. ? Low-fat dairy products. ? Lean meats, fish, and poultry.  Eat small meals often instead of eating 3 large meals each day. Eat your meals slowly in a place where you are relaxed. Avoid bending over or lying  down until 2-3 hours after eating.  Limit high-fat foods such as fatty meats or fried foods.  Limit your intake of fatty foods, such as oils, butter, and shortening.  Avoid the following as told by your doctor: ? Foods that cause symptoms. These may be different for different people. Keep a food diary to keep track of foods that cause symptoms. ? Alcohol. ? Drinking a lot of liquid with meals. ? Eating meals during the 2-3 hours before bed.   Lifestyle  Stay at a healthy weight. Ask your doctor what weight is healthy for you. If you need to lose weight, work with your doctor to do so safely.  Exercise for at least 30 minutes on 5 or more days each week, or as told by your doctor.  Wear loose-fitting clothes.  Do not smoke or use any products that contain nicotine or tobacco. If you need help quitting, ask your doctor.  Sleep with the head of your bed higher than your feet. Use a wedge under the mattress or blocks under the bed frame to raise the head of the bed.  Chew sugar-free gum after meals. What foods should eat? Eat a healthy, well-balanced diet of fruits, vegetables, whole grains, low-fat dairy products, lean meats, fish, and poultry. Each person is different. Foods that may cause symptoms in one person may not cause any symptoms in another person. Work with your doctor to find foods that are safe for you. The items listed above may not be a complete list of what you can eat and drink. Contact a food expert for more options.   What foods should I avoid? Limiting some of these foods may help in managing the symptoms of GERD. Everyone is different. Talk with a food expert or  your doctor to help you find the exact foods to avoid, if any. Fruits Any fruits prepared with added fat. Any fruits that cause symptoms. For some people, this may include citrus fruits, such as oranges, grapefruit, pineapple, and lemons. Vegetables Deep-fried vegetables. [place] fries. Any vegetables  prepared with added fat. Any vegetables that cause symptoms. For some people, this may include tomatoes and tomato products, chili peppers, onions and garlic, and horseradish. Grains Pastries or quick breads with added fat. Meats and other proteins High-fat meats, such as fatty beef or pork, hot dogs, ribs, ham, sausage, salami, and bacon. Fried meat or protein, including fried fish and fried chicken. Nuts and nut butters, in large amounts. Dairy Whole milk and chocolate milk. Sour cream. Cream. Ice cream. Cream cheese. Milkshakes. Fats and oils Butter. Margarine. Shortening. Ghee. Beverages Coffee and tea, with or without caffeine. Carbonated beverages. Sodas. Energy drinks. Fruit juice made with acidic fruits, such as orange or grapefruit. Tomato juice. Alcoholic drinks. Sweets and desserts Chocolate and cocoa. Donuts. Seasonings and condiments Pepper. Peppermint and spearmint. Added salt. Any condiments, herbs, or seasonings that cause symptoms. For some people, this may include curry, hot sauce, or vinegar-based salad dressings. The items listed above may not be a complete list of what you should not eat and drink. Contact a food expert for more options. Questions to ask your doctor Diet and lifestyle changes are often the first steps that are taken to manage symptoms of GERD. If diet and lifestyle changes do not help, talk with your doctor about taking medicines. Where to find more information  International Foundation for Gastrointestinal Disorders: aboutgerd.org Summary  When you have GERD, food and lifestyle choices are very important in easing your symptoms.  Eat small meals often instead of 3 large meals a day. Eat your meals slowly and in a place where you are relaxed.  Avoid bending over or lying down until 2-3 hours after eating.  Limit high-fat foods such as fatty meats or fried foods. This information is not intended to replace advice given to you by your health care  provider. Make sure you discuss any questions you have with your health care provider. Document Revised: 10/25/2019 Document Reviewed: 10/25/2019 Elsevier Patient Education  2021 [place].   Preventing High Cholesterol Cholesterol is a white, waxy substance similar to fat that the human body needs to help build cells. The liver makes all the cholesterol that a person's body needs. Having high cholesterol (hypercholesterolemia) increases your risk for heart disease and stroke. Extra or excess cholesterol comes from the food that you eat. High cholesterol can often be prevented with diet and lifestyle changes. If you already have high cholesterol, you can control it with diet, lifestyle changes, and medicines. How can high cholesterol affect me? If you have high cholesterol, fatty deposits (plaques) may build up on the walls of your blood vessels. The blood vessels that carry blood away from your heart are called arteries. Plaques make the arteries narrower and stiffer. This in turn can:  Restrict or block blood flow and cause blood clots to form.  Increase your risk for heart attack and stroke. What can increase my risk for high cholesterol? This condition is more likely to develop in people who:  Eat foods that are high in saturated fat or cholesterol. Saturated fat is mostly found in foods that come from animal sources.  Are overweight.  Are not getting enough exercise.  Have a family history of high cholesterol (familial hypercholesterolemia). What  actions can I take to prevent this? Nutrition  Eat less saturated fat.  Avoid trans fats (partially hydrogenated oils). These are often found in margarine and in some baked goods, fried foods, and snacks bought in packages.  Avoid precooked or cured meat, such as bacon, sausages, or meat loaves.  Avoid foods and drinks that have added sugars.  Eat more fruits, vegetables, and whole grains.  Choose healthy sources of protein,  such as fish, poultry, lean cuts of red meat, beans, peas, lentils, and nuts.  Choose healthy sources of fat, such as: ? Nuts. ? Vegetable oils, especially olive oil. ? Fish that have healthy fats, such as omega-3 fatty acids. These fish include mackerel or salmon.   Lifestyle  Lose weight if you are overweight. Maintaining a healthy body mass index (BMI) can help prevent or control high cholesterol. It can also lower your risk for diabetes and high blood pressure. Ask your health care provider to help you with a diet and exercise plan to lose weight safely.  Do not use any products that contain nicotine or tobacco, such as cigarettes, e-cigarettes, and chewing tobacco. If you need help quitting, ask your health care provider. Alcohol use  Do not drink alcohol if: ? Your health care provider tells you not to drink. ? You are pregnant, may be pregnant, or are planning to become pregnant.  If you drink alcohol: ? Limit how much you use to:  0-1 drink a day for women.  0-2 drinks a day for men. ? Be aware of how much alcohol is in your drink. In the [place] one drink equals one 12 oz bottle of beer (355 mL), one 5 oz glass of wine (148 mL), or one 1 oz glass of hard liquor (44 mL). Activity  Get enough exercise. Do exercises as told by your health care provider.  Each week, do at least 150 minutes of exercise that takes a medium level of effort (moderate-intensity exercise). This kind of exercise: ? Makes your heart beat faster while allowing you to still be able to talk. ? Can be done in short sessions several times a day or longer sessions a few times a week. For example, on 5 days each week, you could walk fast or ride your bike 3 times a day for 10 minutes each time.   Medicines  Your health care provider may recommend medicines to help lower cholesterol. This may be a medicine to lower the amount of cholesterol that your liver makes. You may need medicine if: ? Diet and lifestyle  changes have not lowered your cholesterol enough. ? You have high cholesterol and other risk factors for heart disease or stroke.  Take over-the-counter and prescription medicines only as told by your health care provider. General information  Manage your risk factors for high cholesterol. Talk with your health care provider about all your risk factors and how to lower your risk.  Manage other conditions that you have, such as diabetes or high blood pressure (hypertension).  Have blood tests to check your cholesterol levels at regular points in time as told by your health care provider.  Keep all follow-up visits as told by your health care provider. This is important. Where to find more information  American Heart Association: www.heart.org  National Heart, Lung, and Blood Institute: https://wilson-eaton.com/ Summary  High cholesterol increases your risk for heart disease and stroke. By keeping your cholesterol level low, you can reduce your risk for these conditions.  High cholesterol can  often be prevented with diet and lifestyle changes.  Work with your health care provider to manage your risk factors, and have your blood tested regularly. This information is not intended to replace advice given to you by your health care provider. Make sure you discuss any questions you have with your health care provider. Document Revised: 01/26/2019 Document Reviewed: 01/26/2019 Elsevier Patient Education  2021 [place].     Signed, Merri Ray, MD [facility]

## 2020-09-13 NOTE — Patient Instructions (Addendum)
If throat symptoms return, can try pepcid over the counter intermittently if needed. See foods to avoid below.   With grandfather's history, a low dose cholesterol pill could be helpful but not absolutely needed at this time. Continue to watch diet and meal prep if possible. Continue exercise. We have option of coronary calcium scoring at some point to help decide on meds if needed. Recheck cholesterol in 6 months.   Thanks for coming in today. Please let me know if there are questions.   Food Choices for Gastroesophageal Reflux Disease, Adult When you have gastroesophageal reflux disease (GERD), the foods you eat and your eating habits are very important. Choosing the right foods can help ease your discomfort. Think about working with a food expert (dietitian) to help you make good choices. What are tips for following this plan? Reading food labels  Look for foods that are low in saturated fat. Foods that may help with your symptoms include: ? Foods that have less than 5% of daily value (DV) of fat. ? Foods that have 0 grams of trans fat. Cooking  Do not fry your food.  Cook your food by baking, steaming, grilling, or broiling. These are all methods that do not need a lot of fat for cooking.  To add flavor, try to use herbs that are low in spice and acidity. Meal planning  Choose healthy foods that are low in fat, such as: ? Fruits and vegetables. ? Whole grains. ? Low-fat dairy products. ? Lean meats, fish, and poultry.  Eat small meals often instead of eating 3 large meals each day. Eat your meals slowly in a place where you are relaxed. Avoid bending over or lying down until 2-3 hours after eating.  Limit high-fat foods such as fatty meats or fried foods.  Limit your intake of fatty foods, such as oils, butter, and shortening.  Avoid the following as told by your doctor: ? Foods that cause symptoms. These may be different for different people. Keep a food diary to keep track  of foods that cause symptoms. ? Alcohol. ? Drinking a lot of liquid with meals. ? Eating meals during the 2-3 hours before bed.   Lifestyle  Stay at a healthy weight. Ask your doctor what weight is healthy for you. If you need to lose weight, work with your doctor to do so safely.  Exercise for at least 30 minutes on 5 or more days each week, or as told by your doctor.  Wear loose-fitting clothes.  Do not smoke or use any products that contain nicotine or tobacco. If you need help quitting, ask your doctor.  Sleep with the head of your bed higher than your feet. Use a wedge under the mattress or blocks under the bed frame to raise the head of the bed.  Chew sugar-free gum after meals. What foods should eat? Eat a healthy, well-balanced diet of fruits, vegetables, whole grains, low-fat dairy products, lean meats, fish, and poultry. Each person is different. Foods that may cause symptoms in one person may not cause any symptoms in another person. Work with your doctor to find foods that are safe for you. The items listed above may not be a complete list of what you can eat and drink. Contact a food expert for more options.   What foods should I avoid? Limiting some of these foods may help in managing the symptoms of GERD. Everyone is different. Talk with a food expert or your doctor to help you  find the exact foods to avoid, if any. Fruits Any fruits prepared with added fat. Any fruits that cause symptoms. For some people, this may include citrus fruits, such as oranges, grapefruit, pineapple, and lemons. Vegetables Deep-fried vegetables. [place] fries. Any vegetables prepared with added fat. Any vegetables that cause symptoms. For some people, this may include tomatoes and tomato products, chili peppers, onions and garlic, and horseradish. Grains Pastries or quick breads with added fat. Meats and other proteins High-fat meats, such as fatty beef or pork, hot dogs, ribs, ham, sausage,  salami, and bacon. Fried meat or protein, including fried fish and fried chicken. Nuts and nut butters, in large amounts. Dairy Whole milk and chocolate milk. Sour cream. Cream. Ice cream. Cream cheese. Milkshakes. Fats and oils Butter. Margarine. Shortening. Ghee. Beverages Coffee and tea, with or without caffeine. Carbonated beverages. Sodas. Energy drinks. Fruit juice made with acidic fruits, such as orange or grapefruit. Tomato juice. Alcoholic drinks. Sweets and desserts Chocolate and cocoa. Donuts. Seasonings and condiments Pepper. Peppermint and spearmint. Added salt. Any condiments, herbs, or seasonings that cause symptoms. For some people, this may include curry, hot sauce, or vinegar-based salad dressings. The items listed above may not be a complete list of what you should not eat and drink. Contact a food expert for more options. Questions to ask your doctor Diet and lifestyle changes are often the first steps that are taken to manage symptoms of GERD. If diet and lifestyle changes do not help, talk with your doctor about taking medicines. Where to find more information  International Foundation for Gastrointestinal Disorders: aboutgerd.org Summary  When you have GERD, food and lifestyle choices are very important in easing your symptoms.  Eat small meals often instead of 3 large meals a day. Eat your meals slowly and in a place where you are relaxed.  Avoid bending over or lying down until 2-3 hours after eating.  Limit high-fat foods such as fatty meats or fried foods. This information is not intended to replace advice given to you by your health care provider. Make sure you discuss any questions you have with your health care provider. Document Revised: 10/25/2019 Document Reviewed: 10/25/2019 Elsevier Patient Education  2021 [employer].   Preventing High Cholesterol Cholesterol is a white, waxy substance similar to fat that the human body needs to help build cells.  The liver makes all the cholesterol that a person's body needs. Having high cholesterol (hypercholesterolemia) increases your risk for heart disease and stroke. Extra or excess cholesterol comes from the food that you eat. High cholesterol can often be prevented with diet and lifestyle changes. If you already have high cholesterol, you can control it with diet, lifestyle changes, and medicines. How can high cholesterol affect me? If you have high cholesterol, fatty deposits (plaques) may build up on the walls of your blood vessels. The blood vessels that carry blood away from your heart are called arteries. Plaques make the arteries narrower and stiffer. This in turn can:  Restrict or block blood flow and cause blood clots to form.  Increase your risk for heart attack and stroke. What can increase my risk for high cholesterol? This condition is more likely to develop in people who:  Eat foods that are high in saturated fat or cholesterol. Saturated fat is mostly found in foods that come from animal sources.  Are overweight.  Are not getting enough exercise.  Have a family history of high cholesterol (familial hypercholesterolemia). What actions can I take to  prevent this? Nutrition  Eat less saturated fat.  Avoid trans fats (partially hydrogenated oils). These are often found in margarine and in some baked goods, fried foods, and snacks bought in packages.  Avoid precooked or cured meat, such as bacon, sausages, or meat loaves.  Avoid foods and drinks that have added sugars.  Eat more fruits, vegetables, and whole grains.  Choose healthy sources of protein, such as fish, poultry, lean cuts of red meat, beans, peas, lentils, and nuts.  Choose healthy sources of fat, such as: ? Nuts. ? Vegetable oils, especially olive oil. ? Fish that have healthy fats, such as omega-3 fatty acids. These fish include mackerel or salmon.   Lifestyle  Lose weight if you are overweight. Maintaining  a healthy body mass index (BMI) can help prevent or control high cholesterol. It can also lower your risk for diabetes and high blood pressure. Ask your health care provider to help you with a diet and exercise plan to lose weight safely.  Do not use any products that contain nicotine or tobacco, such as cigarettes, e-cigarettes, and chewing tobacco. If you need help quitting, ask your health care provider. Alcohol use  Do not drink alcohol if: ? Your health care provider tells you not to drink. ? You are pregnant, may be pregnant, or are planning to become pregnant.  If you drink alcohol: ? Limit how much you use to:  0-1 drink a day for women.  0-2 drinks a day for men. ? Be aware of how much alcohol is in your drink. In the [place] one drink equals one 12 oz bottle of beer (355 mL), one 5 oz glass of wine (148 mL), or one 1 oz glass of hard liquor (44 mL). Activity  Get enough exercise. Do exercises as told by your health care provider.  Each week, do at least 150 minutes of exercise that takes a medium level of effort (moderate-intensity exercise). This kind of exercise: ? Makes your heart beat faster while allowing you to still be able to talk. ? Can be done in short sessions several times a day or longer sessions a few times a week. For example, on 5 days each week, you could walk fast or ride your bike 3 times a day for 10 minutes each time.   Medicines  Your health care provider may recommend medicines to help lower cholesterol. This may be a medicine to lower the amount of cholesterol that your liver makes. You may need medicine if: ? Diet and lifestyle changes have not lowered your cholesterol enough. ? You have high cholesterol and other risk factors for heart disease or stroke.  Take over-the-counter and prescription medicines only as told by your health care provider. General information  Manage your risk factors for high cholesterol. Talk with your health care provider  about all your risk factors and how to lower your risk.  Manage other conditions that you have, such as diabetes or high blood pressure (hypertension).  Have blood tests to check your cholesterol levels at regular points in time as told by your health care provider.  Keep all follow-up visits as told by your health care provider. This is important. Where to find more information  American Heart Association: www.heart.org  National Heart, Lung, and Blood Institute: https://wilson-eaton.com/ Summary  High cholesterol increases your risk for heart disease and stroke. By keeping your cholesterol level low, you can reduce your risk for these conditions.  High cholesterol can often be prevented with diet  and lifestyle changes.  Work with your health care provider to manage your risk factors, and have your blood tested regularly. This information is not intended to replace advice given to you by your health care provider. Make sure you discuss any questions you have with your health care provider. Document Revised: 01/26/2019 Document Reviewed: 01/26/2019 Elsevier Patient Education  [place].

## 2020-10-01 ENCOUNTER — Emergency Department
Admission: EM | Admit: 2020-10-01 | Discharge: 2020-10-01 | Disposition: A | Discharge status: Home / Self Care | Payer: BC Managed Care – PPO | Source: Home / Self Care

## 2020-10-01 ENCOUNTER — Other Ambulatory Visit: Payer: Self-pay

## 2020-10-01 DIAGNOSIS — S161XXA Strain of muscle, fascia and tendon at neck level, initial encounter: Secondary | ICD-10-CM

## 2020-10-01 MED ORDER — CYCLOBENZAPRINE HCL 10 MG PO TABS: 10.0000 mg | ORAL_TABLET | Freq: Two times a day (BID) | ORAL | 0 refills | Status: DC | PRN

## 2020-10-01 NOTE — ED Triage Notes (Signed)
x1 week. Pt states that he was working out when he felt he pulled his neck the wrong way. Pt states that the pain keeps him up at night and makes it hard for him to drive.

## 2020-10-04 ENCOUNTER — Ambulatory Visit: Payer: BC Managed Care – PPO | Admitting: Family Medicine

## 2020-10-06 DIAGNOSIS — S134XXA Sprain of ligaments of cervical spine, initial encounter: Secondary | ICD-10-CM | POA: Diagnosis not present

## 2020-10-06 DIAGNOSIS — S233XXA Sprain of ligaments of thoracic spine, initial encounter: Secondary | ICD-10-CM | POA: Diagnosis not present

## 2020-10-06 DIAGNOSIS — S338XXA Sprain of other parts of lumbar spine and pelvis, initial encounter: Secondary | ICD-10-CM | POA: Diagnosis not present

## 2020-10-10 DIAGNOSIS — S134XXA Sprain of ligaments of cervical spine, initial encounter: Secondary | ICD-10-CM | POA: Diagnosis not present

## 2020-10-10 DIAGNOSIS — S233XXA Sprain of ligaments of thoracic spine, initial encounter: Secondary | ICD-10-CM | POA: Diagnosis not present

## 2020-10-10 DIAGNOSIS — S338XXA Sprain of other parts of lumbar spine and pelvis, initial encounter: Secondary | ICD-10-CM | POA: Diagnosis not present

## 2020-10-12 DIAGNOSIS — S338XXA Sprain of other parts of lumbar spine and pelvis, initial encounter: Secondary | ICD-10-CM | POA: Diagnosis not present

## 2020-10-12 DIAGNOSIS — S233XXA Sprain of ligaments of thoracic spine, initial encounter: Secondary | ICD-10-CM | POA: Diagnosis not present

## 2020-10-12 DIAGNOSIS — S134XXA Sprain of ligaments of cervical spine, initial encounter: Secondary | ICD-10-CM | POA: Diagnosis not present

## 2020-11-06 ENCOUNTER — Other Ambulatory Visit: Payer: Self-pay

## 2020-11-06 ENCOUNTER — Ambulatory Visit: Payer: BC Managed Care – PPO | Admitting: Family Medicine

## 2020-11-06 VITALS — BP 122/78 | HR 81 | Temp 98.2°F | Resp 17 | Ht 70.5 in | Wt 234.4 lb

## 2020-11-06 DIAGNOSIS — M542 Cervicalgia: Secondary | ICD-10-CM

## 2020-11-06 DIAGNOSIS — S161XXD Strain of muscle, fascia and tendon at neck level, subsequent encounter: Secondary | ICD-10-CM

## 2020-11-06 MED ORDER — CYCLOBENZAPRINE HCL 5 MG PO TABS: 5.0000 mg | ORAL_TABLET | Freq: Three times a day (TID) | ORAL | 1 refills | Status: DC | PRN

## 2020-11-06 MED ORDER — PREDNISONE 20 MG PO TABS: ORAL_TABLET | ORAL | 0 refills | Status: DC

## 2020-11-06 NOTE — Patient Instructions (Addendum)
Tylenol over-the-counter is okay if needed, but avoid NSAIDs while taking prednisone.  Can restart muscle relaxant Flexeril 1-2 at bedtime if needed.  X-ray at [facility] office.  If any concerns I will let you know.  Try to set a timer and change position of the neck throughout the day when at your computer.  Follow-up in the next 2 weeks, sooner if worse.  If not improving, the next step would be MRI to evaluate the neck further including the discs.  Return to the clinic or go to the nearest emergency room if any of your symptoms worsen or new symptoms occur.

## 2020-11-06 NOTE — Progress Notes (Signed)
Subjective:  Patient ID: Marco Reynolds, male    DOB: 1990/05/01  Age: 30 y.o. MRN: [id number]  CC:  Chief Complaint  Patient presents with   Neck Pain    Pt reports neck pain starting June 1st worked out that day no specified strain did stretch after, pain came on later, seen urgent care 10/01/20 took muscle relaxer, made bearable, no imaging done.   Pt reports severe pain on Rt side of neck     HPI Marco Reynolds presents for   R sided neck pain: Started with workout June 1st. NKI during workout. Upper body lift day. Similar program for weeks. Noted soreness later after stretching. Noticed soreness after workout.  Seen by urgent care June 5.  Diagnosed with neck strain.  Prescribed Flexeril 10 mg for 10 days. Able to function/sleep but still sore, daily pain.  Able to do most activities - able to work, worse with sitting at computer.  Pain radiates down from neck to R shoulder, and upper back. Not down or hand.  No weakness.  R hand dominant.  Tx: tylenol, ibuprofen - minimal change. Off meds past few weeks.  No prior neck surgery, issue.   History Patient Active Problem List   Diagnosis Date Noted   Hypercalcemia 02/04/2020   Epididymitis, left 01/28/2020   Effusion of knee joint right 07/17/2018   Left ankle pain 07/17/2018   Obesity (BMI 30.0-34.9) 04/01/2018   Deviated nasal septum 07/21/2017   Past Medical History:  Diagnosis Date   Allergy    Past Surgical History:  Procedure Laterality Date   MENISCUS REPAIR Right    No Known Allergies Prior to Admission medications   Medication Sig Start Date End Date Taking? Authorizing Provider  Omega-3 Fatty Acids (FISH OIL BURP-LESS PO) Take by mouth.    [provider]  VITAMIN D PO Take 2,000 Units by mouth daily.    [provider]   Social History   Socioeconomic History   Marital status: Married    Spouse name: Not on file   Number of children: Not on file   Years of education:  Not on file   Highest education level: Not on file  Occupational History   Not on file  Tobacco Use   Smoking status: Never   Smokeless tobacco: Former    Quit date: 2020  Substance and Sexual Activity   Alcohol use: Yes    Alcohol/week: 2.0 standard drinks    Types: 2 Cans of beer per week    Comment: occ   Drug use: Never   Sexual activity: Yes    Birth control/protection: None  Other Topics Concern   Not on file  Social History Narrative   Not on file   Social Determinants of Health   Financial Resource Strain: Not on file  Food Insecurity: Not on file  Transportation Needs: Not on file  Physical Activity: Not on file  Stress: Not on file  Social Connections: Not on file  Intimate Partner Violence: Not on file    Review of Systems Per HPI. No weakness, rash.   Objective:   Vitals:   11/06/20 0835  BP: 122/78  Pulse: 81  Resp: 17  Temp: 98.2 F (36.8 C)  TempSrc: Temporal  SpO2: 98%  Weight: 234 lb 6.4 oz (106.3 kg)  Height: 5' 10.5" (1.791 m)     Physical Exam Vitals reviewed.  Constitutional:      General: He is not in acute distress.  Appearance: Normal appearance. He is well-developed.  HENT:     Head: Normocephalic and atraumatic.  Cardiovascular:     Rate and Rhythm: Normal rate.  Pulmonary:     Effort: Pulmonary effort is normal.  Musculoskeletal:     Comments: C-spine, slight decreased extension, rotation with discomfort more with right greater than left rotation.  No midline bony tenderness.  Slight tenderness with spasm over the right paraspinals to the right trapezius, levator scapulae.  No midline bony tenderness of the thoracic spine.  Equal upper extremity strength, equal grip strength intrinsic hand muscle strength.  Sensation intact and equal distally. Full range of motion and rotator cuff strength on right side. Reflexes 1+ at biceps, triceps, brachioradialis bilaterally, equal  Skin:    Capillary Refill: Capillary refill takes  less than 2 seconds.     Findings: No rash.  Neurological:     Mental Status: He is alert and oriented to person, place, and time.  Psychiatric:        Mood and Affect: Mood normal.       Assessment & Plan:  Marco Reynolds is a 30 y.o. male . Neck pain on right side - Plan: DG Cervical Spine Complete, cyclobenzaprine (FLEXERIL) 5 MG tablet, predniSONE (DELTASONE) 20 MG tablet  Strain of neck muscle, subsequent encounter - Plan: DG Cervical Spine Complete, cyclobenzaprine (FLEXERIL) 5 MG tablet, predniSONE (DELTASONE) 20 MG tablet   Suspected right neck strain, differential includes disc herniation with persistent symptoms.  Neurovascular intact distally, no weakness.  Check imaging, prednisone prescribed with side effects and risk discussed, repeat prescription for Flexeril if needed, Tylenol over-the-counter, recheck 2 weeks if not improving, sooner if worse.  Consider MRI as neck step.  Meds ordered this encounter  Medications   cyclobenzaprine (FLEXERIL) 5 MG tablet    Sig: Take 1 tablet (5 mg total) by mouth 3 (three) times daily as needed for muscle spasms (start qhs prn due to sedation).    Dispense:  15 tablet    Refill:  1   predniSONE (DELTASONE) 20 MG tablet    Sig: 3 by mouth for 3 days, then 2 by mouth for 2 days, then 1 by mouth for 2 days, then 1/2 by mouth for 2 days.    Dispense:  16 tablet    Refill:  0   Patient Instructions  Tylenol over-the-counter is okay if needed, but avoid NSAIDs while taking prednisone.  Can restart muscle relaxant Flexeril 1-2 at bedtime if needed.  X-ray at [facility] office.  If any concerns I will let you know.  Try to set a timer and change position of the neck throughout the day when at your computer.  Follow-up in the next 2 weeks, sooner if worse.  If not improving, the next step would be MRI to evaluate the neck further including the discs.  Return to the clinic or go to the nearest emergency room if any of your symptoms  worsen or new symptoms occur.       Signed,   Merri Ray, MD [place] 11/06/20 9:28 AM

## 2020-11-09 ENCOUNTER — Ambulatory Visit (INDEPENDENT_AMBULATORY_CARE_PROVIDER_SITE_OTHER): Payer: BC Managed Care – PPO

## 2020-11-09 DIAGNOSIS — S161XXD Strain of muscle, fascia and tendon at neck level, subsequent encounter: Secondary | ICD-10-CM | POA: Diagnosis not present

## 2020-11-09 DIAGNOSIS — M542 Cervicalgia: Secondary | ICD-10-CM

## 2020-11-20 ENCOUNTER — Ambulatory Visit: Payer: BC Managed Care – PPO | Admitting: Family Medicine

## 2020-11-27 ENCOUNTER — Encounter: Payer: Self-pay | Admitting: Family Medicine

## 2020-11-28 ENCOUNTER — Telehealth: Payer: BC Managed Care – PPO | Admitting: Family Medicine

## 2020-11-28 ENCOUNTER — Encounter: Payer: Self-pay | Admitting: Family Medicine

## 2020-11-28 VITALS — Temp 99.9°F

## 2020-11-28 DIAGNOSIS — U071 COVID-19: Secondary | ICD-10-CM | POA: Diagnosis not present

## 2020-11-28 MED ORDER — NIRMATRELVIR/RITONAVIR (PAXLOVID)TABLET: 3.0000 | ORAL_TABLET | Freq: Two times a day (BID) | ORAL | 0 refills | Status: AC

## 2020-11-28 NOTE — Patient Instructions (Signed)
HOME CARE TIPS:   -I sent the medication(s) we discussed to your pharmacy: Meds ordered this encounter  Medications   nirmatrelvir/ritonavir EUA (PAXLOVID) TABS    Sig: Take 3 tablets by mouth 2 (two) times daily for 5 days. (Take nirmatrelvir 150 mg two tablets twice daily for 5 days and ritonavir 100 mg one tablet twice daily for 5 days) Patient GFR is 82    Dispense:  30 tablet    Refill:  0     -I sent in the [place] treatment or referral you requested per our discussion. Please see the information provided below and discuss further with the pharmacist/treatment team.   -can use tylenol or aleve if needed for fevers, aches and pains per instructions  -can use nasal saline a few times per day if you have nasal congestion; sometimes  a short course of Afrin nasal spray for 3 days can help with symptoms as well  -stay hydrated, drink plenty of fluids and eat small healthy meals - avoid dairy  -follow up with your doctor in 2-3 days unless improving and feeling better  -stay home while sick, except to seek medical care. If you have COVID19, ideally it would be best to stay home for a full 10 days since the onset of symptoms PLUS one day of no fever and feeling better. If you are feeling better and do choose to go around others after 5 days, please wear a good mask that fits snugly (such as N95 or KN95) at all times to reduce the risk of transmission.  It was nice to meet you today, and I really hope you are feeling better soon. I help [facility] out with telemedicine visits on Tuesdays and Thursdays and am available for visits on those days. If you have any concerns or questions following this visit please schedule a follow up visit with your Primary Care doctor or seek care at a local urgent care clinic to avoid delays in care.    Seek in person care or schedule a follow up video visit promptly if your symptoms worsen, new concerns arise or you are not improving with treatment. Call 911  and/or seek emergency care if your symptoms are severe or life threatening.  FACT SHEET FOR PATIENTS, PARENTS, AND CAREGIVERS EMERGENCY USE AUTHORIZATION (EUA) OF PAXLOVID FOR CORONAVIRUS DISEASE 2019 (COVID-19) You are being given this Fact Sheet because your healthcare provider believes it is necessary to provide you with PAXLOVID for the treatment of mild-to-moderate coronavirus disease (COVID-19) caused by the SARS-CoV-2 virus. This Fact Sheet contains information to help you understand the risks and benefits of taking the PAXLOVID you have received or may receive. The U.S. Food and Drug Administration (FDA) has issued an Emergency Use Authorization (EUA) to make PAXLOVID available during the COVID-19 pandemic (for more details about an EUA please see "What is an Emergency Use Authorization?" at the end of this document). PAXLOVID is not an FDA-approved medicine in the [place]. Read this Fact Sheet for information about PAXLOVID. Talk to your healthcare provider about your options or if you have any questions. It is your choice to take PAXLOVID.  What is COVID-19? COVID-19 is caused by a virus called a coronavirus. You can get COVID-19 through close contact with another person who has the virus. COVID-19 illnesses have ranged from very mild-to-severe, including illness resulting in death. While information so far suggests that most COVID-19 illness is mild, serious illness can happen and may cause some of your other medical  conditions to become worse. Older people and people of all ages with severe, long lasting (chronic) medical conditions like heart disease, lung disease, and diabetes, for example seem to be at higher risk of being hospitalized for COVID-19.  What is PAXLOVID? PAXLOVID is an investigational medicine used to treat mild-to-moderate COVID-19 in adults and children [71 years of age and older weighing at least 68 pounds (39 kg)] with positive results of direct  SARS-CoV-2 viral testing, and who are at high risk for progression to severe COVID-19, including hospitalization or death. PAXLOVID is investigational because it is still being studied. There is limited information about the safety and effectiveness of using PAXLOVID to treat people with mild-to-moderate COVID-19.  The FDA has authorized the emergency use of PAXLOVID for the treatment of mild-tomoderate COVID-19 in adults and children [32 years of age and older weighing at least 60 pounds (74 kg)] with a positive test for the virus that causes COVID-19, and who are at high risk for progression to severe COVID-19, including hospitalization or death, under an EUA. 1 Revised: 14 July 2020   What should I tell my healthcare provider before I take PAXLOVID? Tell your healthcare provider if you: ? Have any allergies ? Have liver or kidney disease ? Are pregnant or plan to become pregnant ? Are breastfeeding a child ? Have any serious illnesses  Tell your healthcare provider about all the medicines you take, including prescription and over-the-counter medicines, vitamins, and herbal supplements. Some medicines may interact with PAXLOVID and may cause serious side effects. Keep a list of your medicines to show your healthcare provider and pharmacist when you get a new medicine.  You can ask your healthcare provider or pharmacist for a list of medicines that interact with PAXLOVID. Do not start taking a new medicine without telling your healthcare provider. Your healthcare provider can tell you if it is safe to take PAXLOVID with other medicines.  Tell your healthcare provider if you are taking combined hormonal contraceptive. PAXLOVID may affect how your birth control pills work. Females who are able to become pregnant should use another effective alternative form of contraception or an additional barrier method of contraception. Talk to your healthcare provider if you have any questions  about contraceptive methods that might be right for you.  How do I take PAXLOVID? ? PAXLOVID consists of 2 medicines: nirmatrelvir and ritonavir. o Take 2 pink tablets of nirmatrelvir with 1 white tablet of ritonavir by mouth 2 times each day (in the morning and in the evening) for 5 days. For each dose, take all 3 tablets at the same time. o If you have kidney disease, talk to your healthcare provider. You may need a different dose. ? Swallow the tablets whole. Do not chew, break, or crush the tablets. ? Take PAXLOVID with or without food. ? Do not stop taking PAXLOVID without talking to your healthcare provider, even if you feel better. ? If you miss a dose of PAXLOVID within 8 hours of the time it is usually taken, take it as soon as you remember. If you miss a dose by more than 8 hours, skip the missed dose and take the next dose at your regular time. Do not take 2 doses of PAXLOVID at the same time. ? If you take too much PAXLOVID, call your healthcare provider or go to the nearest hospital emergency room right away. ? If you are taking a ritonavir- or cobicistat-containing medicine to treat hepatitis C or Human Immunodeficiency Virus (  HIV), you should continue to take your medicine as prescribed by your healthcare provider. 2 Revised: 14 July 2020    Talk to your healthcare provider if you do not feel better or if you feel worse after 5 days.  Who should generally not take PAXLOVID? Do not take PAXLOVID if: ? You are allergic to nirmatrelvir, ritonavir, or any of the ingredients in PAXLOVID. ? You are taking any of the following medicines: o Alfuzosin o Pethidine, propoxyphene o Ranolazine o Amiodarone, dronedarone, flecainide, propafenone, quinidine o Colchicine o Lurasidone, pimozide, clozapine o Dihydroergotamine, ergotamine, methylergonovine o Lovastatin, simvastatin o Sildenafil (Revatio) for pulmonary arterial hypertension (PAH) o Triazolam, oral midazolam o  Apalutamide o Carbamazepine, phenobarbital, phenytoin o Rifampin o St. John's Wort (hypericum perforatum) Taking PAXLOVID with these medicines may cause serious or life-threatening side effects or affect how PAXLOVID works.  These are not the only medicines that may cause serious side effects if taken with PAXLOVID. PAXLOVID may increase or decrease the levels of multiple other medicines. It is very important to tell your healthcare provider about all of the medicines you are taking because additional laboratory tests or changes in the dose of your other medicines may be necessary while you are taking PAXLOVID. Your healthcare provider may also tell you about specific symptoms to watch out for that may indicate that you need to stop or decrease the dose of some of your other medicines.  What are the important possible side effects of PAXLOVID? Possible side effects of PAXLOVID are: ? Allergic Reactions. Allergic reactions can happen in people taking PAXLOVID, even after only 1 dose. Stop taking PAXLOVID and call your healthcare provider right away if you get any of the following symptoms of an allergic reaction: o hives o trouble swallowing or breathing o swelling of the mouth, lips, or face o throat tightness o hoarseness 3 Revised: 14 July 2020  o skin rash ? Liver Problems. Tell your healthcare provider right away if you have any of these signs and symptoms of liver problems: loss of appetite, yellowing of your skin and the whites of eyes (jaundice), dark-colored urine, pale colored stools and itchy skin, stomach area (abdominal) pain. ? Resistance to HIV Medicines. If you have untreated HIV infection, PAXLOVID may lead to some HIV medicines not working as well in the future. ? Other possible side effects include: o altered sense of taste o diarrhea o high blood pressure o muscle aches These are not all the possible side effects of PAXLOVID. Not many people have  taken PAXLOVID. Serious and unexpected side effects may happen. PAXLOVID is still being studied, so it is possible that all of the risks are not known at this time.  What other treatment choices are there? Veklury (remdesivir) is FDA-approved for the treatment of mild-to-moderate NTZGY-17 in certain adults and children. Talk with your doctor to see if Marijean Heath is appropriate for you. Like PAXLOVID, FDA may also allow for the emergency use of other medicines to treat people with COVID-19. Go to https://price.info/ for information on the emergency use of other medicines that are authorized by FDA to treat people with COVID-19. Your healthcare provider may talk with you about clinical trials for which you may be eligible. It is your choice to be treated or not to be treated with PAXLOVID. Should you decide not to receive it or for your child not to receive it, it will not change your standard medical care.  What if I am pregnant or breastfeeding? There is no  experience treating pregnant women or breastfeeding mothers with PAXLOVID. For a mother and unborn baby, the benefit of taking PAXLOVID may be greater than the risk from the treatment. If you are pregnant, discuss your options and specific situation with your healthcare provider. It is recommended that you use effective barrier contraception or do not have sexual activity while taking PAXLOVID. If you are breastfeeding, discuss your options and specific situation with your healthcare provider. 4 Revised: 14 July 2020   How do I report side effects with PAXLOVID? Contact your healthcare provider if you have any side effects that bother you or do not go away. Report side effects to FDA MedWatch at SmoothHits.hu or call 1-800-FDA1088 or you can report side effects to [employer]. at the contact information  provided below. Website Fax number Telephone number www.pfizersafetyreporting.com [phone] How should I store [place]? Store PAXLOVID tablets at room temperature, between 68?F to 77?F (20?C to 25?C). How can I learn more about COVID-19? ? Ask your healthcare provider. ? Visit https://jacobson-johnson.com/. ? Contact your local or state public health department. What is an Emergency Use Authorization (EUA)? The [place] FDA has made PAXLOVID available under an emergency access mechanism called an Emergency Use Authorization (EUA). The EUA is supported by a Education officer, museum and Human Service (HHS) declaration that circumstances exist to justify the emergency use of drugs and biological products during the COVID-19 pandemic. PAXLOVID for the treatment of mild-to-moderate COVID-19 in adults and children [37 years of age and older weighing at least 37 pounds (74 kg)] with positive results of direct SARS-CoV-2 viral testing, and who are at high risk for progression to severe COVID-19, including hospitalization or death, has not undergone the same type of review as an FDA-approved product. In issuing an EUA under the VQXIH-03 public health emergency, the FDA has determined, among other things, that based on the total amount of scientific evidence available including data from adequate and well-controlled clinical trials, if available, it is reasonable to believe that the product may be effective for diagnosing, treating, or preventing COVID-19, or a serious or life-threatening disease or condition caused by COVID-19; that the known and potential benefits of the product, when used to diagnose, treat, or prevent such disease or condition, outweigh the known and potential risks of such product; and that there are no adequate, approved, and available alternatives. All of these criteria must be met to allow for the product to be used in the treatment of patients during  the COVID-19 pandemic. The EUA for PAXLOVID is in effect for the duration of the COVID-19 declaration justifying emergency use of this product, unless terminated or revoked (after which the products may no longer be used under the EUA). 5 Revised: 14 July 2020     Additional Information For general questions, visit the website or call the telephone number provided below. Website Telephone number www.COVID19oralRx.com [phone] (1-877-C19-PACK) You can also go to www.pfizermedinfo.com or call [phone] for more information. [id number] Revised: 14 July 2020

## 2020-11-28 NOTE — Progress Notes (Signed)
Virtual Visit via Video Note  I connected with Marco Reynolds  on 11/28/20 at 10:20 AM EDT by a video enabled telemedicine application and verified that I am speaking with the correct person using two identifiers.  Location patient: home, [place] Location provider:work or home office Persons participating in the virtual visit: patient, provider  I discussed the limitations of evaluation and management by telemedicine and the availability of in person appointments. The patient expressed understanding and agreed to proceed.   HPI:  Acute telemedicine visit for Covid19: -Onset: yesterday; had a positive home test yesterday -Symptoms include:mild cough, headache, runny nose, low grade temps, body aches -Denies: fevers > 100, CP, SOB, NVD, inability to eat/drink/get out of bed -Pertinent past medical history: obesity -Pertinent medication allergies: No Known Allergies -COVID-19 vaccine status: 2 moderna doses and a moderna booster -GFR was 82 in May on scanned labs  ROS: See pertinent positives and negatives per HPI.  Past Medical History:  Diagnosis Date   Allergy     Past Surgical History:  Procedure Laterality Date   MENISCUS REPAIR Right      Current Outpatient Medications:    Multiple Vitamins-Minerals (MULTIVITAMIN ADULTS PO), Take by mouth daily., Disp: , Rfl:    nirmatrelvir/ritonavir EUA (PAXLOVID) TABS, Take 3 tablets by mouth 2 (two) times daily for 5 days. (Take nirmatrelvir 150 mg two tablets twice daily for 5 days and ritonavir 100 mg one tablet twice daily for 5 days) Patient GFR is 82, Disp: 30 tablet, Rfl: 0   Omega-3 Fatty Acids (FISH OIL BURP-LESS PO), Take by mouth., Disp: , Rfl:   EXAM:  VITALS per patient if applicable:  GENERAL: alert, oriented, appears well and in no acute distress  HEENT: atraumatic, conjunttiva clear, no obvious abnormalities on inspection of external nose and ears  NECK: normal movements of the head and neck  LUNGS: on inspection no signs of  respiratory distress, breathing rate appears normal, no obvious gross SOB, gasping or wheezing  CV: no obvious cyanosis  MS: moves all visible extremities without noticeable abnormality  PSYCH/NEURO: pleasant and cooperative, no obvious depression or anxiety, speech and thought processing grossly intact  ASSESSMENT AND PLAN:  Discussed the following assessment and plan:  COVID-19   Discussed treatment options (infusions and oral and drug interactions), ideal treatment window, potential complications, isolation and precautions for COVID-19.  After lengthy discussion, the patient opted for treatment with Paxlovid as his wife whom is a nurse wanted him to take it and due to being higher risk for complications of covid or severe disease and other factors. Discussed EUA status of this drug and the fact that there is preliminary limited knowledge of risks/interactions/side effects per EUA document vs possible benefits and precautions. Discussed potential rebound. This information was shared with patient during the visit and also was provided in patient instructions. Also, advised that patient discuss risks/interactions and use with pharmacist/treatment team as well. Other symptomatic care measures summarized in patient instructions. Work/School slipped offered: declined  Advised to seek prompt in person care if worsening, new symptoms arise, or if is not improving with treatment. Discussed options for inperson care if PCP office not available. Did let this patient know that I only do telemedicine on Tuesdays and Thursdays for [place]. Advised to schedule follow up visit with PCP or [facility] if any further questions or concerns to avoid delays in care.   I discussed the assessment and treatment plan with the patient. The patient was provided an opportunity to ask questions and  all were answered. The patient agreed with the plan and demonstrated an understanding of the instructions.     Lucretia Kern, DO

## 2020-12-20 ENCOUNTER — Ambulatory Visit: Payer: BC Managed Care – PPO | Admitting: Internal Medicine

## 2020-12-20 ENCOUNTER — Other Ambulatory Visit: Payer: Self-pay

## 2020-12-20 VITALS — BP 136/80 | HR 87 | Ht 70.5 in | Wt 227.2 lb

## 2020-12-20 DIAGNOSIS — Z8639 Personal history of other endocrine, nutritional and metabolic disease: Secondary | ICD-10-CM | POA: Diagnosis not present

## 2020-12-20 LAB — BASIC METABOLIC PANEL
BUN: 11 mg/dL (ref 6–23)
CO2: 28 mEq/L (ref 19–32)
Calcium: 9.8 mg/dL (ref 8.4–10.5)
Chloride: 100 mEq/L (ref 96–112)
Creatinine, Ser: 1.05 mg/dL (ref 0.40–1.50)
GFR: 94.28 mL/min (ref >60.00)
Glucose, Bld: 91 mg/dL (ref 70–99)
Potassium: 4.1 mEq/L (ref 3.5–5.1)
Sodium: 138 mEq/L (ref 135–145)

## 2020-12-20 LAB — ALBUMIN: Albumin: 5 g/dL (ref 3.5–5.2)

## 2020-12-20 LAB — VITAMIN D 25 HYDROXY (VIT D DEFICIENCY, FRACTURES): VITD: 34.87 ng/mL (ref 30.00–100.00)

## 2020-12-20 LAB — TSH: TSH: 1.56 u[IU]/mL (ref 0.35–5.50)

## 2020-12-20 NOTE — Progress Notes (Signed)
Name: Marco Reynolds  MRN/ DOB: [id number], 09-07-1990    Age/ Sex: 30 y.o., male     PCP: Shade Flood, MD   Reason for Endocrinology Evaluation: Hyperparathyroidism      Initial Endocrinology Clinic Visit: 03/15/2020    PATIENT IDENTIFIER: Mr. Marco Reynolds is a 30 y.o., male with a past medical history of unremarkable . He has followed with [facility] since 03/15/2020 for consultative assistance with management of his hyperparathyroidism .   HISTORICAL SUMMARY: The patient was first noted with hypercalcemia in 2019 , with a serum Max of 11.5 mg/dL ( no concomitance Albumin at the time ) but historically has high Albumin with a max of 5.1 g/dL.  His parathyroid hormone was elevated in 01/2020 at 68 pg/mL  No concomitant vitamin D.   Alk. Phos has always been normal    He has not been on Vitamin D, lithium or HCTZ in the past   His 24- hr urine calcium was low at 76 with Ca/Cr ratio of 0.0028    He denies history of kidney stones, liver or renal disease.  SUBJECTIVE:   Today (12/20/2020):  Marco Reynolds is here for a follow up on hypercalcemia and hyperparathyroidism.   Denies  polydipsia or polyuria  Denies renal stones  Denies constipation    Currently on MVI    HISTORY:  Past Medical History:  Past Medical History:  Diagnosis Date   Allergy    Past Surgical History:  Past Surgical History:  Procedure Laterality Date   MENISCUS REPAIR Right    Social History:  reports that he has never smoked. He quit smokeless tobacco use about 2 years ago. He reports current alcohol use of about 2.0 standard drinks per week. He reports that he does not use drugs. Family History:  Family History  Problem Relation Age of Onset   Healthy Mother    Gestational diabetes Mother    Miscarriages / [place] Mother    Healthy Father    Hyperlipidemia Father    Hypertension Father    Kidney disease Father    Gout Father    Healthy Brother     Healthy Brother      HOME MEDICATIONS: Allergies as of 12/20/2020   No Known Allergies      Medication List        Accurate as of December 20, 2020  1:18 PM. If you have any questions, ask your nurse or doctor.          FISH OIL BURP-LESS PO Take by mouth.   MULTIVITAMIN ADULTS PO Take by mouth daily.          OBJECTIVE:   PHYSICAL EXAM: VS: There were no vitals taken for this visit.   EXAM: General: Pt appears well and is in NAD  Neck: General: Supple without adenopathy. Thyroid: Thyroid size normal.  No goiter or nodules appreciated. No thyroid bruit.  Lungs: Clear with good BS bilat with no rales, rhonchi, or wheezes  Heart: Auscultation: RRR.  Abdomen: Normoactive bowel sounds, soft, nontender, without masses or organomegaly palpable  Extremities:  BL LE: No pretibial edema normal ROM and strength.  Skin: Hair: Texture and amount normal with gender appropriate distribution Skin Inspection: No rashes Skin Palpation: Skin temperature, texture, and thickness normal to palpation  Neuro: Cranial nerves: II - XII grossly intact  Cerebellar: Normal coordination and movement; no tremor Motor: Normal strength throughout DTRs: 2+ and symmetric in UE without delay in relaxation phase  Mental Status: Judgment, insight: Intact Memory: Intact for recent and remote events Mood and affect: No depression, anxiety, or agitation     DATA REVIEWED: Results for IRVAN, TIEDT "Marco Reynolds" (MRN [id number]) as of 12/22/2020 11:32  Ref. Range 12/20/2020 14:24  Sodium Latest Ref Range: 135 - 145 mEq/L 138  Potassium Latest Ref Range: 3.5 - 5.1 mEq/L 4.1  Chloride Latest Ref Range: 96 - 112 mEq/L 100  CO2 Latest Ref Range: 19 - 32 mEq/L 28  Glucose Latest Ref Range: 70 - 99 mg/dL 91  BUN Latest Ref Range: 6 - 23 mg/dL 11  Creatinine Latest Ref Range: 0.40 - 1.50 mg/dL 1.05  Calcium Latest Ref Range: 8.4 - 10.5 mg/dL 9.8  Calcium Ionized Latest Ref Range: 4.8 - 5.6 mg/dL  4.97  Albumin Latest Ref Range: 3.5 - 5.2 g/dL 5.0  GFR Latest Ref Range: >60.00 mL/min 94.28  VITD Latest Ref Range: 30.00 - 100.00 ng/mL 34.87  PTH, Intact Latest Ref Range: 16 - 77 pg/mL 45  TSH Latest Ref Range: 0.35 - 5.50 uIU/mL 1.56  T4,Free(Direct) Latest Ref Range: 0.60 - 1.60 ng/dL 0.89      ASSESSMENT / PLAN / RECOMMENDATIONS:   Secondary Hyperparathyroidism :  - He had an elevated PTH in 01/2020 but this has normalized after taking Vitamin D for a few months -I have reviewed his old records that he dropped off to the office that showed intermittent serum calcium elevation but after correction for the abnormal level serum calcium was normal. -Repeat PTH, serum calcium, ionized calcium have all come back normal and at this time there is no evidence of hypercalcemia or hyperparathyroidism.  He will continue with multivitamin daily    Follow-up as needed Signed electronically by: Mack Guise, MD  [facility]  [facility] [place], [place] [address] Phone: [phone] FAX: [phone]      CC: Wendie Agreste, MD [address] [place] [address] Phone: [phone]  Fax: [phone]   Return to Endocrinology clinic as below: Future Appointments  Date Time Provider [place]  12/20/2020  2:00 PM Meleni Delahunt, Melanie Crazier, MD [facility] None  03/16/2021  8:00 AM Wendie Agreste, MD [facility]

## 2020-12-21 LAB — PARATHYROID HORMONE, INTACT (NO CA): PTH: 45 pg/mL (ref 16–77)

## 2020-12-21 LAB — CALCIUM, IONIZED: Calcium, Ion: 4.97 mg/dL (ref 4.8–5.6)

## 2020-12-21 LAB — T4, FREE: Free T4: 0.89 ng/dL (ref 0.60–1.60)

## 2021-02-05 DIAGNOSIS — Z23 Encounter for immunization: Secondary | ICD-10-CM | POA: Diagnosis not present

## 2021-03-16 ENCOUNTER — Encounter: Payer: BC Managed Care – PPO | Admitting: Family Medicine

## 2021-06-20 ENCOUNTER — Ambulatory Visit (INDEPENDENT_AMBULATORY_CARE_PROVIDER_SITE_OTHER): Payer: BC Managed Care – PPO | Admitting: Family Medicine

## 2021-06-20 ENCOUNTER — Encounter: Payer: Self-pay | Admitting: Family Medicine

## 2021-06-20 VITALS — BP 128/70 | HR 95 | Temp 97.9°F | Resp 15 | Ht 70.5 in | Wt 219.4 lb

## 2021-06-20 DIAGNOSIS — Z0000 Encounter for general adult medical examination without abnormal findings: Secondary | ICD-10-CM

## 2021-06-20 NOTE — Progress Notes (Signed)
Subjective:  Patient ID: Marco Reynolds, male    DOB: Jan 23, 1991  Age: 31 y.o. MRN: [id number]  CC:  Chief Complaint  Patient presents with   Annual Exam    Pt here to have annual exam, no concerns, due for labs but would like to do this at his jobs clinic     HPI [name] presents for   Annual physical exam.   2.27yr old son [name] (sp?) new 84mo dtr Kathyrn Lass. 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   Bloodwork with fasting labs next week - will send to me.   GERD: No recent sx's. Improved with exercise/diet.   Hyperparathyroidism Treated by endocrinology previously with vitamin D supplement. Still taking vit D with MVI and 2 times per week 1000iu additional.  Lab Results  Component Value Date   PTH 45 12/20/2020   CALCIUM 9.8 12/20/2020   CAION 4.97 12/20/2020   PHOS 4.7 (H) 06/21/2020  Last vitamin D Lab Results  Component Value Date   VD25OH 34.87 12/20/2020    Hyperlipidemia: With family history of hyperlipidemia, father with hyperlipidemia on meds since his age 50.  Paternal grandfather with early CAD in 30s to 57s.  However he was a smoker.  Diet/exercise approach. Considered low dose statin.   Lab Results  Component Value Date   CHOL 238 (H) 04/01/2018   HDL 44 04/01/2018   LDLCALC 159 (H) 04/01/2018   TRIG 176 (H) 04/01/2018   CHOLHDL 5.4 (H) 04/01/2018   Lab Results  Component Value Date   ALT 24 06/21/2020   AST 22 06/21/2020   ALKPHOS 68 06/21/2020   BILITOT 0.4 06/21/2020   Maternal GM breast CA Paternal GM with cancer late in life.   Immunization History  Administered Date(s) Administered   DTaP 03/13/1989, 05/13/1989, 07/08/1992, 07/23/1992, 03/11/1994   Hepatitis A, Adult 09/09/2017   Hepatitis B 01/29/2001, 03/05/2001, 07/16/2001   Hepatitis B, adult 09/18/2017, 11/04/2017, 01/08/2018   HiB (PRP-OMP) 03/13/1989, 05/13/1989, 07/08/1989, 05/07/1990   IPV 03/13/1989, 05/13/1989, 07/23/1989,  03/11/1994   Influenza,inj,Quad PF,6+ Mos 02/13/2017, 02/09/2019   Influenza-Unspecified 02/06/2016, 01/27/2018, 02/07/2020   MMR 05/07/1990, 03/11/1994   Meningococcal Mcv4o 09/09/2017   Moderna Sars-Covid-2 Vaccination 07/01/2019, 08/03/2019, 03/02/2020   Tdap 10/24/2006, 07/14/2014  Option of bivalent booster discussed. Covid infection June 2022.  Flu vaccine last fall.   Optho: No glasses/contacts. Had eye exam few years ago - ok.   Dental:  - every 6 months.   STI screening: declines.   Exercise: -Peloton 3 days and lifting weights 3 days per week.    History Patient Active Problem List   Diagnosis Date Noted   Hypercalcemia 02/04/2020   Epididymitis, left 01/28/2020   Effusion of knee joint right 07/17/2018   Left ankle pain 07/17/2018   Obesity (BMI 30.0-34.9) 04/01/2018   Deviated nasal septum 07/21/2017   Past Medical History:  Diagnosis Date   Allergy    Past Surgical History:  Procedure Laterality Date   MENISCUS REPAIR Right    No Known Allergies Prior to Admission medications   Medication Sig Start Date End Date Taking? Authorizing Provider  Multiple Vitamins-Minerals (MULTIVITAMIN ADULTS PO) Take by mouth daily.   Yes [provider]  Omega-3 Fatty Acids (FISH OIL BURP-LESS PO) Take by mouth.   Yes [provider]   Social History   Socioeconomic History   Marital status: Married    Spouse name: Not on file   Number of children: Not  on file   Years of education: Not on file   Highest education level: Not on file  Occupational History   Not on file  Tobacco Use   Smoking status: Never   Smokeless tobacco: Former    Types: Sarina Ser    Quit date: 2020  Substance and Sexual Activity   Alcohol use: Yes    Alcohol/week: 2.0 standard drinks    Types: 2 Cans of beer per week    Comment: occ   Drug use: Never   Sexual activity: Yes    Birth control/protection: None  Other Topics Concern   Not on file  Social History Narrative    Not on file   Social Determinants of Health   Financial Resource Strain: Not on file  Food Insecurity: Not on file  Transportation Needs: Not on file  Physical Activity: Not on file  Stress: Not on file  Social Connections: Not on file  Intimate Partner Violence: Not on file    Review of Systems 13 point review of systems per patient health survey noted.  Negative other than as indicated above or in HPI.   Objective:   Vitals:   06/20/21 1354  BP: 128/70  Pulse: 95  Resp: 15  Temp: 97.9 F (36.6 C)  TempSrc: Temporal  SpO2: 97%  Weight: 219 lb 6.4 oz (99.5 kg)  Height: 5' 10.5" (1.791 m)    Physical Exam Vitals reviewed.  Constitutional:      Appearance: He is well-developed.  HENT:     Head: Normocephalic and atraumatic.     Right Ear: External ear normal.     Left Ear: External ear normal.  Eyes:     Conjunctiva/sclera: Conjunctivae normal.     Pupils: Pupils are equal, round, and reactive to light.  Neck:     Thyroid: No thyromegaly.  Cardiovascular:     Rate and Rhythm: Normal rate and regular rhythm.     Heart sounds: Normal heart sounds.  Pulmonary:     Effort: Pulmonary effort is normal. No respiratory distress.     Breath sounds: Normal breath sounds. No wheezing.  Abdominal:     General: There is no distension.     Palpations: Abdomen is soft.     Tenderness: There is no abdominal tenderness.  Musculoskeletal:        General: No tenderness. Normal range of motion.     Cervical back: Normal range of motion and neck supple.  Lymphadenopathy:     Cervical: No cervical adenopathy.  Skin:    General: Skin is warm and dry.  Neurological:     Mental Status: He is alert and oriented to person, place, and time.     Deep Tendon Reflexes: Reflexes are normal and symmetric.  Psychiatric:        Behavior: Behavior normal.       Assessment & Plan:  Cochise Dinneen is a 32 y.o. male . Annual physical exam  - -anticipatory guidance as below  in AVS, screening labs above. Health maintenance items as above in HPI discussed/recommended as applicable.   - labs from work planned.   - recheck 1 year  No orders of the defined types were placed in this encounter.  Patient Instructions  Sounds like you are doing well.  Thanks for coming in today.  Send me a copy of your labs from work when available and we can discuss options based on those readings.  Let me know if there are questions.  Take  care.  Preventive Care 45-50 Years Old, Male Preventive care refers to lifestyle choices and visits with your health care provider that can promote health and wellness. Preventive care visits are also called wellness exams. What can I expect for my preventive care visit? Counseling During your preventive care visit, your health care provider may ask about your: Medical history, including: Past medical problems. Family medical history. Current health, including: Emotional well-being. Home life and relationship well-being. Sexual activity. Lifestyle, including: Alcohol, nicotine or tobacco, and drug use. Access to firearms. Diet, exercise, and sleep habits. Safety issues such as seatbelt and bike helmet use. Sunscreen use. Work and work Statistician. Physical exam Your health care provider may check your: Height and weight. These may be used to calculate your BMI (body mass index). BMI is a measurement that tells if you are at a healthy weight. Waist circumference. This measures the distance around your waistline. This measurement also tells if you are at a healthy weight and may help predict your risk of certain diseases, such as type 2 diabetes and high blood pressure. Heart rate and blood pressure. Body temperature. Skin for abnormal spots. What immunizations do I need? Vaccines are usually given at various ages, according to a schedule. Your health care provider will recommend vaccines for you based on your age, medical history, and  lifestyle or other factors, such as travel or where you work. What tests do I need? Screening Your health care provider may recommend screening tests for certain conditions. This may include: Lipid and cholesterol levels. Diabetes screening. This is done by checking your blood sugar (glucose) after you have not eaten for a while (fasting). Hepatitis B test. Hepatitis C test. HIV (human immunodeficiency virus) test. STI (sexually transmitted infection) testing, if you are at risk. Talk with your health care provider about your test results, treatment options, and if necessary, the need for more tests. Follow these instructions at home: Eating and drinking  Eat a healthy diet that includes fresh fruits and vegetables, whole grains, lean protein, and low-fat dairy products. Drink enough fluid to keep your urine pale yellow. Take vitamin and mineral supplements as recommended by your health care provider. Do not drink alcohol if your health care provider tells you not to drink. If you drink alcohol: Limit how much you have to 0-2 drinks a day. Know how much alcohol is in your drink. In the [place] one drink equals one 12 oz bottle of beer (355 mL), one 5 oz glass of wine (148 mL), or one 1 oz glass of hard liquor (44 mL). Lifestyle Brush your teeth every morning and night with fluoride toothpaste. Floss one time each day. Exercise for at least 30 minutes 5 or more days each week. Do not use any products that contain nicotine or tobacco. These products include cigarettes, chewing tobacco, and vaping devices, such as e-cigarettes. If you need help quitting, ask your health care provider. Do not use drugs. If you are sexually active, practice safe sex. Use a condom or other form of protection to prevent STIs. Find healthy ways to manage stress, such as: Meditation, yoga, or listening to music. Journaling. Talking to a trusted person. Spending time with friends and family. Minimize exposure to  UV radiation to reduce your risk of skin cancer. Safety Always wear your seat belt while driving or riding in a vehicle. Do not drive: If you have been drinking alcohol. Do not ride with someone who has been drinking. If you have been using any  mind-altering substances or drugs. While texting. When you are tired or distracted. Wear a helmet and other protective equipment during sports activities. If you have firearms in your house, make sure you follow all gun safety procedures. Seek help if you have been physically or sexually abused. What's next? Go to your health care provider once a year for an annual wellness visit. Ask your health care provider how often you should have your eyes and teeth checked. Stay up to date on all vaccines. This information is not intended to replace advice given to you by your health care provider. Make sure you discuss any questions you have with your health care provider. Document Revised: 10/11/2020 Document Reviewed: 10/11/2020 Elsevier Patient Education  2022 [place],   Merri Ray, MD [place] 06/20/21 2:24 PM

## 2021-06-20 NOTE — Patient Instructions (Signed)
Sounds like you are doing well.  Thanks for coming in today.  Send me a copy of your labs from work when available and we can discuss options based on those readings.  Let me know if there are questions.  Take care.  Preventive Care 33-33 Years Old, Male Preventive care refers to lifestyle choices and visits with your health care provider that can promote health and wellness. Preventive care visits are also called wellness exams. What can I expect for my preventive care visit? Counseling During your preventive care visit, your health care provider may ask about your: Medical history, including: Past medical problems. Family medical history. Current health, including: Emotional well-being. Home life and relationship well-being. Sexual activity. Lifestyle, including: Alcohol, nicotine or tobacco, and drug use. Access to firearms. Diet, exercise, and sleep habits. Safety issues such as seatbelt and bike helmet use. Sunscreen use. Work and work Statistician. Physical exam Your health care provider may check your: Height and weight. These may be used to calculate your BMI (body mass index). BMI is a measurement that tells if you are at a healthy weight. Waist circumference. This measures the distance around your waistline. This measurement also tells if you are at a healthy weight and may help predict your risk of certain diseases, such as type 2 diabetes and high blood pressure. Heart rate and blood pressure. Body temperature. Skin for abnormal spots. What immunizations do I need? Vaccines are usually given at various ages, according to a schedule. Your health care provider will recommend vaccines for you based on your age, medical history, and lifestyle or other factors, such as travel or where you work. What tests do I need? Screening Your health care provider may recommend screening tests for certain conditions. This may include: Lipid and cholesterol levels. Diabetes screening. This  is done by checking your blood sugar (glucose) after you have not eaten for a while (fasting). Hepatitis B test. Hepatitis C test. HIV (human immunodeficiency virus) test. STI (sexually transmitted infection) testing, if you are at risk. Talk with your health care provider about your test results, treatment options, and if necessary, the need for more tests. Follow these instructions at home: Eating and drinking  Eat a healthy diet that includes fresh fruits and vegetables, whole grains, lean protein, and low-fat dairy products. Drink enough fluid to keep your urine pale yellow. Take vitamin and mineral supplements as recommended by your health care provider. Do not drink alcohol if your health care provider tells you not to drink. If you drink alcohol: Limit how much you have to 0-2 drinks a day. Know how much alcohol is in your drink. In the [place]., one drink equals one 12 oz bottle of beer (355 mL), one 5 oz glass of wine (148 mL), or one 1 oz glass of hard liquor (44 mL). Lifestyle Brush your teeth every morning and night with fluoride toothpaste. Floss one time each day. Exercise for at least 30 minutes 5 or more days each week. Do not use any products that contain nicotine or tobacco. These products include cigarettes, chewing tobacco, and vaping devices, such as e-cigarettes. If you need help quitting, ask your health care provider. Do not use drugs. If you are sexually active, practice safe sex. Use a condom or other form of protection to prevent STIs. Find healthy ways to manage stress, such as: Meditation, yoga, or listening to music. Journaling. Talking to a trusted person. Spending time with friends and family. Minimize exposure to UV radiation to reduce  your risk of skin cancer. Safety Always wear your seat belt while driving or riding in a vehicle. Do not drive: If you have been drinking alcohol. Do not ride with someone who has been drinking. If you have been using any  mind-altering substances or drugs. While texting. When you are tired or distracted. Wear a helmet and other protective equipment during sports activities. If you have firearms in your house, make sure you follow all gun safety procedures. Seek help if you have been physically or sexually abused. What's next? Go to your health care provider once a year for an annual wellness visit. Ask your health care provider how often you should have your eyes and teeth checked. Stay up to date on all vaccines. This information is not intended to replace advice given to you by your health care provider. Make sure you discuss any questions you have with your health care provider. Document Revised: 10/11/2020 Document Reviewed: 10/11/2020 Elsevier Patient Education  [place].

## 2021-07-06 ENCOUNTER — Encounter: Payer: Self-pay | Admitting: Family Medicine

## 2022-02-11 DIAGNOSIS — Z23 Encounter for immunization: Secondary | ICD-10-CM | POA: Diagnosis not present

## 2022-02-15 ENCOUNTER — Telehealth: Payer: Self-pay | Admitting: Family Medicine

## 2022-02-15 NOTE — Telephone Encounter (Signed)
Received vaccine administration record form from [place]. Placed in front bin.

## 2022-02-18 NOTE — Telephone Encounter (Signed)
Documented and sent to scan

## 2022-06-21 ENCOUNTER — Encounter: Payer: BC Managed Care – PPO | Admitting: Family Medicine

## 2022-07-31 ENCOUNTER — Ambulatory Visit (INDEPENDENT_AMBULATORY_CARE_PROVIDER_SITE_OTHER): Payer: BC Managed Care – PPO | Admitting: Family Medicine

## 2022-07-31 ENCOUNTER — Encounter: Payer: Self-pay | Admitting: Family Medicine

## 2022-07-31 ENCOUNTER — Ambulatory Visit: Payer: Self-pay

## 2022-07-31 ENCOUNTER — Ambulatory Visit (INDEPENDENT_AMBULATORY_CARE_PROVIDER_SITE_OTHER): Payer: BC Managed Care – PPO

## 2022-07-31 VITALS — BP 120/86 | HR 70 | Ht 70.0 in | Wt 217.0 lb

## 2022-07-31 DIAGNOSIS — M25571 Pain in right ankle and joints of right foot: Secondary | ICD-10-CM

## 2022-07-31 DIAGNOSIS — M25471 Effusion, right ankle: Secondary | ICD-10-CM | POA: Diagnosis not present

## 2022-07-31 MED ORDER — PREDNISONE 20 MG PO TABS: 40.0000 mg | ORAL_TABLET | Freq: Every day | ORAL | 0 refills | Status: DC

## 2022-07-31 NOTE — Patient Instructions (Addendum)
Spenco Total Support Orthotics Exercises Prednisone 40 mg daily for 5 days Tart cherry extract 2400mg  at night Xray today See me again in 5-6 weeks

## 2022-07-31 NOTE — Assessment & Plan Note (Signed)
Patient does have a right ankle effusion noted.  Does have a double line that can be consistent with questionable uric acid deposits.  We discussed that there is some narrowing of the ankle joint and will get x-rays to further evaluate.  Increase activity slowly.  Follow-up with me again in 6 to 8 weeks discussed quickly taking prednisone as well as over-the-counter medications.

## 2022-07-31 NOTE — Progress Notes (Signed)
[facility] [place] Phone: [phone] Subjective:   Fontaine No, am serving as a scribe for Dr. Hulan Saas.  I'm seeing this patient by the request  of:  Wendie Agreste, MD  CC: Right ankle pain and swelling  [id number]  Marco Reynolds is a 32 y.o. male coming in with complaint of R ankle pain. Saw rheumatology a couple of years ago for insidious onset of swelling and pain in L ankle. Has rolled R ankle a couple of times over past 2 years but no injury recently. Having another bout of insidious onset of pain in R ankle for past 14 days. Pain in ankle mortise and feels decrease in ROM.         Past Medical History:  Diagnosis Date   Allergy    Past Surgical History:  Procedure Laterality Date   MENISCUS REPAIR Right    Social History   Socioeconomic History   Marital status: Married    Spouse name: Not on file   Number of children: Not on file   Years of education: Not on file   Highest education level: Not on file  Occupational History   Not on file  Tobacco Use   Smoking status: Never   Smokeless tobacco: Former    Types: Chew    Quit date: 2020  Substance and Sexual Activity   Alcohol use: Yes    Alcohol/week: 2.0 standard drinks of alcohol    Types: 2 Cans of beer per week    Comment: occ   Drug use: Never   Sexual activity: Yes    Birth control/protection: None  Other Topics Concern   Not on file  Social History Narrative   Not on file   Social Determinants of Health   Financial Resource Strain: Not on file  Food Insecurity: Not on file  Transportation Needs: Not on file  Physical Activity: Not on file  Stress: Not on file  Social Connections: Not on file   No Known Allergies Family History  Problem Relation Age of Onset   Healthy Mother    Gestational diabetes Mother    Miscarriages / [place] Mother    Healthy Father    Hyperlipidemia Father     Hypertension Father    Kidney disease Father    Gout Father    Healthy Brother    Healthy Brother     Current Outpatient Medications (Endocrine & Metabolic):    predniSONE (DELTASONE) 20 MG tablet, Take 2 tablets (40 mg total) by mouth daily with breakfast.      Current Outpatient Medications (Other):    Multiple Vitamins-Minerals (MULTIVITAMIN ADULTS PO), Take by mouth daily.   Omega-3 Fatty Acids (FISH OIL BURP-LESS PO), Take by mouth.   Reviewed prior external information including notes and imaging from  primary care provider As well as notes that were available from care everywhere and other healthcare systems.  Past medical history, social, surgical and family history all reviewed in electronic medical record.  No pertanent information unless stated regarding to the chief complaint.   Review of Systems:  No headache, visual changes, nausea, vomiting, diarrhea, constipation, dizziness, abdominal pain, skin rash, fevers, chills, night sweats, weight loss, swollen lymph nodes, body aches, joint swelling, chest pain, shortness of breath, mood changes. POSITIVE muscle aches  Objective  Blood pressure 120/86, pulse 70, height 5\' 10"  (1.778 m), weight 217 lb (98.4 kg), SpO2 98 %.   General: No  apparent distress alert and oriented x3 mood and affect normal, dressed appropriately.  HEENT: Pupils equal, extraocular movements intact  Respiratory: Patient's speak in full sentences and does not appear short of breath  Cardiovascular: No lower extremity edema, non tender, no erythema  Mild antalgic gait noted.  Patient is a breakdown of the transverse arch noted.  Splaying between the first and second toes.  Does have some limited dorsiflexion noted.  Limited muscular skeletal ultrasound was performed and interpreted by Hulan Saas, M  Right ankle does have hypoechoic changes that is consistent with an effusion noted.  Does have narrowing of the talar dome and anterior ankle.   Patient does have a double line that is consistent with potential facet deposits. Impression: Questionable gout flare with effusion of the ankle joint    Impression and Recommendations:    The above documentation has been reviewed and is accurate and complete Lyndal Pulley, DO

## 2022-08-05 ENCOUNTER — Encounter: Payer: Self-pay | Admitting: Family Medicine

## 2022-08-08 ENCOUNTER — Other Ambulatory Visit: Payer: Self-pay

## 2022-08-08 DIAGNOSIS — G8929 Other chronic pain: Secondary | ICD-10-CM

## 2022-08-09 ENCOUNTER — Other Ambulatory Visit: Payer: Self-pay | Admitting: Family Medicine

## 2022-08-09 ENCOUNTER — Encounter: Payer: Self-pay | Admitting: Family Medicine

## 2022-08-09 ENCOUNTER — Ambulatory Visit (INDEPENDENT_AMBULATORY_CARE_PROVIDER_SITE_OTHER): Payer: BC Managed Care – PPO | Admitting: Family Medicine

## 2022-08-09 VITALS — BP 126/88 | HR 99 | Temp 98.3°F | Ht 70.0 in | Wt 210.0 lb

## 2022-08-09 DIAGNOSIS — E785 Hyperlipidemia, unspecified: Secondary | ICD-10-CM

## 2022-08-09 DIAGNOSIS — R062 Wheezing: Secondary | ICD-10-CM

## 2022-08-09 DIAGNOSIS — J309 Allergic rhinitis, unspecified: Secondary | ICD-10-CM | POA: Diagnosis not present

## 2022-08-09 DIAGNOSIS — E211 Secondary hyperparathyroidism, not elsewhere classified: Secondary | ICD-10-CM

## 2022-08-09 DIAGNOSIS — Z0000 Encounter for general adult medical examination without abnormal findings: Secondary | ICD-10-CM

## 2022-08-09 MED ORDER — ALBUTEROL SULFATE HFA 108 (90 BASE) MCG/ACT IN AERS
1.0000 | INHALATION_SPRAY | RESPIRATORY_TRACT | 0 refills | Status: DC | PRN
Start: 2022-08-09 — End: 2022-09-25

## 2022-08-09 MED ORDER — QVAR REDIHALER 80 MCG/ACT IN AERB: 1.0000 | INHALATION_SPRAY | Freq: Two times a day (BID) | RESPIRATORY_TRACT | 1 refills | Status: DC

## 2022-08-09 MED ORDER — BUDESONIDE 90 MCG/ACT IN AEPB
1.0000 | INHALATION_SPRAY | Freq: Two times a day (BID) | RESPIRATORY_TRACT | 1 refills | Status: DC
Start: 2022-08-09 — End: 2022-08-09

## 2022-08-09 NOTE — Patient Instructions (Addendum)
Please send me a copy of work labs. If A1c is elevated, would recommend repeat testing in 3 months.  Albuterol up to every 4 hours if needed for wheezing but if you require that consistently in the next day or 2, or persistent needed beyond the next day or 2, I do recommend starting the Pulmicort inhaler twice per day.  That should help calm down wheezing.  Once symptoms improve over the next week or 2 can try stopping that steroid inhaler.  Continue to treat allergies with Astepro, Allegra, and Flonase if needed.  If any worsening symptoms let me know.  Thanks for coming in today and take care.  Preventive Care 34-34 Years Old, Male Preventive care refers to lifestyle choices and visits with your health care provider that can promote health and wellness. Preventive care visits are also called wellness exams. What can I expect for my preventive care visit? Counseling During your preventive care visit, your health care provider may ask about your: Medical history, including: Past medical problems. Family medical history. Current health, including: Emotional well-being. Home life and relationship well-being. Sexual activity. Lifestyle, including: Alcohol, nicotine or tobacco, and drug use. Access to firearms. Diet, exercise, and sleep habits. Safety issues such as seatbelt and bike helmet use. Sunscreen use. Work and work Astronomer. Physical exam Your health care provider may check your: Height and weight. These may be used to calculate your BMI (body mass index). BMI is a measurement that tells if you are at a healthy weight. Waist circumference. This measures the distance around your waistline. This measurement also tells if you are at a healthy weight and may help predict your risk of certain diseases, such as type 2 diabetes and high blood pressure. Heart rate and blood pressure. Body temperature. Skin for abnormal spots. What immunizations do I need?  Vaccines are usually given  at various ages, according to a schedule. Your health care provider will recommend vaccines for you based on your age, medical history, and lifestyle or other factors, such as travel or where you work. What tests do I need? Screening Your health care provider may recommend screening tests for certain conditions. This may include: Lipid and cholesterol levels. Diabetes screening. This is done by checking your blood sugar (glucose) after you have not eaten for a while (fasting). Hepatitis B test. Hepatitis C test. HIV (human immunodeficiency virus) test. STI (sexually transmitted infection) testing, if you are at risk. Talk with your health care provider about your test results, treatment options, and if necessary, the need for more tests. Follow these instructions at home: Eating and drinking  Eat a healthy diet that includes fresh fruits and vegetables, whole grains, lean protein, and low-fat dairy products. Drink enough fluid to keep your urine pale yellow. Take vitamin and mineral supplements as recommended by your health care provider. Do not drink alcohol if your health care provider tells you not to drink. If you drink alcohol: Limit how much you have to 0-2 drinks a day. Know how much alcohol is in your drink. In the [place] one drink equals one 12 oz bottle of beer (355 mL), one 5 oz glass of wine (148 mL), or one 1 oz glass of hard liquor (44 mL). Lifestyle Brush your teeth every morning and night with fluoride toothpaste. Floss one time each day. Exercise for at least 30 minutes 5 or more days each week. Do not use any products that contain nicotine or tobacco. These products include cigarettes, chewing tobacco, and vaping  devices, such as e-cigarettes. If you need help quitting, ask your health care provider. Do not use drugs. If you are sexually active, practice safe sex. Use a condom or other form of protection to prevent STIs. Find healthy ways to manage stress, such  as: Meditation, yoga, or listening to music. Journaling. Talking to a trusted person. Spending time with friends and family. Minimize exposure to UV radiation to reduce your risk of skin cancer. Safety Always wear your seat belt while driving or riding in a vehicle. Do not drive: If you have been drinking alcohol. Do not ride with someone who has been drinking. If you have been using any mind-altering substances or drugs. While texting. When you are tired or distracted. Wear a helmet and other protective equipment during sports activities. If you have firearms in your house, make sure you follow all gun safety procedures. Seek help if you have been physically or sexually abused. What's next? Go to your health care provider once a year for an annual wellness visit. Ask your health care provider how often you should have your eyes and teeth checked. Stay up to date on all vaccines. This information is not intended to replace advice given to you by your health care provider. Make sure you discuss any questions you have with your health care provider. Document Revised: 10/11/2020 Document Reviewed: 10/11/2020 Elsevier Patient Education  2023 [employer]   Bronchospasm, Adult  Bronchospasm is a tightening of the smooth muscle that wraps around the small airways in the lungs. When the muscle tightens, the small airways narrow. Narrowed airways limit the air you breathe in or out of your lungs. Inflammation (swelling) and more mucus (sputum) than usual can further irritate the airways. This can make it very hard to breathe. Bronchospasm can happen suddenly or over a period of time. What are the causes? Common causes of this condition include: An infection, such as a cold or sinus drainage. Exercise. Strong odors from aerosol sprays, and fumes from perfume, candles, and household cleaners. Cold air. Stress or strong emotions such as crying or laughing. What increases the risk? The  following factors may make you more likely to develop this condition: Having asthma. Smoking or being around someone who smokes (secondhand smoke). Seasonal allergies, such as pollen or mold. Allergic reaction (anaphylaxis) to food, medicine, or insect bites or stings. What are the signs or symptoms? Symptoms of this condition include: Making a high-pitched whistling sound when you breathe, most often when you breathe out (wheezing). Coughing. Chest tightness. Shortness of breath. Decreased ability to exercise. Noisy breathing or a high-pitched cough. How is this diagnosed? This condition may be diagnosed based on your medical history and a physical exam. Your health care provider may also perform tests, including: A chest X-ray. Lung function tests. How is this treated? This condition may be treated by: Using inhaled medicines. These open up (relax) the airways and help you breathe. They can be taken with a metered dose inhaler or a nebulizer device. Taking corticosteroid medicines. These may be given to reduce inflammation and swelling. Removing the irritant or trigger that started the bronchospasm. Follow these instructions at home: Medicines Take over-the-counter and prescription medicines only as told by your health care provider. If you need to use an inhaler or nebulizer to take your medicine, ask your health care provider how to use it correctly. You may be given a spacer to use with your inhaler. This makes it easier to get the medicine from the inhaler  into your lungs. Lifestyle Do not use any products that contain nicotine or tobacco. These products include cigarettes, chewing tobacco, and vaping devices, such as e-cigarettes. If you need help quitting, ask your health care provider. Keep track of things that trigger your bronchospasm. Avoid these if possible. When pollen, air pollution, or humidity levels are bad, keep windows closed and use an air conditioner or go to  places that have air conditioning. Find ways to manage stress and your emotions, such as mindfulness, relaxation, or breathing exercises. Activity Some people have bronchospasm when they exercise. This is called exercise-induced bronchoconstriction (EIB). If you have this problem, talk with your health care provider about how to manage EIB. Some tips include: Using your fast-acting inhaler before exercise. Exercising indoors if it is very cold or humid, or if the pollen and mold counts are high. Warming up and cool down before and after exercise. Stopping exercising right away if your symptoms start or get worse. General instructions If you have asthma, make sure you have an asthma action plan. Stay up to date on your immunizations. Keep all follow-up visits. This is important. Get help right away if: You have trouble breathing. Your wheezing and coughing do not get better after taking your medicine. You have chest pain. You have trouble speaking more than one-word sentences. These symptoms may be an emergency. Get help right away. Call 911. Do not wait to see if the symptoms will go away. Do not drive yourself to the hospital. Summary Bronchospasm is a tightening of the smooth muscle that wraps around the small airways in the lungs. Some people have bronchospasm when they exercise. This is called exercise-induced bronchoconstriction (EIB). If you have this problem, talk with your health care provider about how to manage EIB. Do not use any products that contain nicotine or tobacco. These products include cigarettes, chewing tobacco, and vaping devices, such as e-cigarettes. If you need help quitting, ask your health care provider. Get help right away if your wheezing and coughing do not get better after taking your medicine. This information is not intended to replace advice given to you by your health care provider. Make sure you discuss any questions you have with your health care  provider. Document Revised: 11/06/2020 Document Reviewed: 11/06/2020 Elsevier Patient Education  2023 [employer].

## 2022-08-09 NOTE — Telephone Encounter (Signed)
Pharmacy is asking for an alternative to the Pulmicort . They did give [place] any alternatives to chose from

## 2022-08-09 NOTE — Progress Notes (Signed)
Subjective:  Patient ID: Marco Reynolds, male    DOB: 02-Dec-1990  Age: 32 y.o. MRN: [id number]  CC:  Chief Complaint  Patient presents with   Annual Exam   Cough    Pt reports dry cough going on for 2 wks with wheezing. Worse at night. Denied fever. Not on any med.     HPI Marco Reynolds presents for Annual Exam Annual exam and cough Last physical in February 2023, has had blood work done through work.  History of GERD, improved with diet/exercise when discussed last year, no current meds. Dtr Kathyrn Lass and Sundra Aland doing well. Wife still working as Corporate investment banker for [employer].  Family is doing well. TBall for 4yo son.   Followed by Dr. Katrinka Blazing with [facility] for R ankle pain. Plan for MRI.   Hyperparathyroidism Previously treated by endocrinology, treated with vitamin D supplement daily and MVI. No recent need for endocrine eval.  Labs at work. in past few months.  Lab Results  Component Value Date   VD25OH 34.87 12/20/2020   Cough Past 2 weeks.  More last week. No hx of asthma, but inhaler rx few years ago - allergies. No recent use. Cough Worse at night, has noticed some wheezing at night. Clearing throat, coughing fit at times. No fever. No dyspnea. No sick contacts, feels well otherwise.  History of allergic rhinitis treated with astepro, Allegra. Ran out of flonase.  Recent prednisone for ankle issues.   Hyperlipidemia: Family history of early CAD with his grandfather in his 21s or 48s.  Father with hyperlipidemia on meds since age 39.  Previously has been on diet/exercise approach. Outside labs from work on 06/27/2021, normal CMP, glucose 98, creatinine normal at 1.08, normal LFTs, A1c high normal at 5.6, TSH normal at 1.48, vitamin D 42, normal magnesium, normal B12, 289.  Normal CBC.  Lipid panel with total 176, triglycerides 120, HDL 35, LDL 119, down from 159 previously. Started on Crestor in Dec 2023 - no new side effects recently.  Labs at work recently. Borderline  A1c.  Lab Results  Component Value Date   CHOL 238 (H) 04/01/2018   HDL 44 04/01/2018   LDLCALC 159 (H) 04/01/2018   TRIG 176 (H) 04/01/2018   CHOLHDL 5.4 (H) 04/01/2018   Lab Results  Component Value Date   ALT 24 06/21/2020   AST 22 06/21/2020   ALKPHOS 68 06/21/2020   BILITOT 0.4 06/21/2020         08/09/2022    9:29 AM 06/20/2021    1:57 PM 11/06/2020    8:38 AM 09/13/2020    9:19 AM 08/12/2019    1:30 PM  Depression screen PHQ 2/9  Decreased Interest 0 0 0 0 0  Down, Depressed, Hopeless 0 0 0 0 0  PHQ - 2 Score 0 0 0 0 0  Altered sleeping 0 0 0    Tired, decreased energy 1 1 0    Change in appetite 1 0 0    Feeling bad or failure about yourself  0 0 0    Trouble concentrating 1 1 0    Moving slowly or fidgety/restless 0 0 0    Suicidal thoughts 0 0 0    PHQ-9 Score 3 2 0    Difficult doing work/chores Not difficult at all       STI screening - declined    Health Maintenance  Topic Date Due   COVID-19 Vaccine (4 - 2023-24 season) 08/25/2022 (Originally 12/28/2021)  INFLUENZA VACCINE  11/28/2022   DTaP/Tdap/Td (7 - Td or Tdap) 07/13/2024   Hepatitis C Screening  Completed   HIV Screening  Completed   HPV VACCINES  Aged Out  No FH of early cancer.  Pat GM - ovarian CA, mat GM - breast CA.   Immunization History  Administered Date(s) Administered   DTaP 03/13/1989, 05/13/1989, 07/08/1992, 07/23/1992, 03/11/1994   HIB (PRP-OMP) 03/13/1989, 05/13/1989, 07/08/1989, 05/07/1990   Hepatitis A, Adult 09/09/2017   Hepatitis B 01/29/2001, 03/05/2001, 07/16/2001   Hepatitis B, ADULT 09/18/2017, 11/04/2017, 01/08/2018   IPV 03/13/1989, 05/13/1989, 07/23/1989, 03/11/1994   Influenza Inj Mdck Quad Pf 02/11/2022   Influenza,inj,Quad PF,6+ Mos 02/13/2017, 02/09/2019   Influenza-Unspecified 02/06/2016, 01/27/2018, 02/07/2020, 02/11/2022   MMR 05/07/1990, 03/11/1994   Meningococcal Mcv4o 09/09/2017   Moderna Sars-Covid-2 Vaccination 07/01/2019, 08/03/2019, 03/02/2020    Tdap 10/24/2006, 07/14/2014  Covid booster - discussed. Option of updated booster. Covid infection in January - no residual sx's   No glasses/contacts. No recent optho eval.   Dental:Within Last 6 months  Alcohol: occasional - less than weekly.   Tobacco: none.   Exercise/overweight. Eating better, more exercise since start of year, prior peloton. limited now with ankle issues. Still upper body exercises.  Wt Readings from Last 3 Encounters:  08/09/22 210 lb (95.3 kg)  07/31/22 217 lb (98.4 kg)  06/20/21 219 lb 6.4 oz (99.5 kg)   Body mass index is 30.13 kg/m.    History Patient Active Problem List   Diagnosis Date Noted   Ankle effusion, right 07/31/2022   Hypercalcemia 02/04/2020   Epididymitis, left 01/28/2020   Effusion of knee joint right 07/17/2018   Left ankle pain 07/17/2018   Obesity (BMI 30.0-34.9) 04/01/2018   Deviated nasal septum 07/21/2017   Nasal turbinate hypertrophy 07/21/2017   Seasonal allergic rhinitis due to pollen 12/16/2016   Past Medical History:  Diagnosis Date   Allergy    Past Surgical History:  Procedure Laterality Date   MENISCUS REPAIR Right    No Known Allergies Prior to Admission medications   Medication Sig Start Date End Date Taking? Authorizing Provider  Ascorbic Acid (VITAMIN C) 500 MG CHEW 1 tablet Orally Twice a week   Yes [provider]  azelastine (ASTELIN) 0.1 % nasal spray 1 -2 puffs in each nostril Nasally Once a day for 30 day(s) 10/09/21  Yes [provider]  fexofenadine (ALLEGRA) 180 MG tablet 1 tablet Swallow whole with water; do not take with fruit juices. Orally Once a day for 90 days 07/12/21  Yes [provider]  indomethacin (INDOCIN) 50 MG capsule  08/06/22  Yes [provider]  Multiple Vitamin (MULTIVITAMIN ADULT) TABS Orally   Yes [provider]  Omega-3 Fatty Acids (FISH OIL BURP-LESS PO) Take by mouth.   Yes [provider]  rosuvastatin (CRESTOR) 10 MG  tablet Take 10 mg by mouth daily.   Yes [provider]  Vitamin D, Cholecalciferol, 25 MCG (1000 UT) TABS 1 tablet Orally twice weekly   Yes [provider]  fluticasone (FLONASE) 50 MCG/ACT nasal spray 1 spray in each nostril Nasally Once a day for 90 days Patient not taking: Reported on 08/09/2022 07/12/21   [provider]  hydrOXYzine (ATARAX) 10 MG tablet 1 tablet as needed Orally every 6-8 hrs for anxiety for 30 days Patient not taking: Reported on 08/09/2022    [provider]   Social History   Socioeconomic History   Marital status: Married  Spouse name: Not on file   Number of children: Not on file   Years of education: Not on file   Highest education level: Not on file  Occupational History   Not on file  Tobacco Use   Smoking status: Never   Smokeless tobacco: Former    Types: Dorna Bloom    Quit date: 2020  Substance and Sexual Activity   Alcohol use: Yes    Alcohol/week: 2.0 standard drinks of alcohol    Types: 2 Cans of beer per week    Comment: occ   Drug use: Never   Sexual activity: Yes    Birth control/protection: None  Other Topics Concern   Not on file  Social History Narrative   Not on file   Social Determinants of Health   Financial Resource Strain: Not on file  Food Insecurity: Not on file  Transportation Needs: Not on file  Physical Activity: Not on file  Stress: Not on file  Social Connections: Not on file  Intimate Partner Violence: Not on file    Review of Systems 13 point review of systems per patient health survey noted.  Negative other than as indicated above or in HPI.    Objective:   Vitals:   08/09/22 0926  BP: 126/88  Pulse: 99  Temp: 98.3 F (36.8 C)  TempSrc: Oral  SpO2: 96%  Weight: 210 lb (95.3 kg)  Height: 5\' 10"  (1.778 m)     Physical Exam Vitals reviewed.  Constitutional:      Appearance: He is well-developed.  HENT:     Head: Normocephalic and atraumatic.     Right Ear:  External ear normal.     Left Ear: External ear normal.  Eyes:     Conjunctiva/sclera: Conjunctivae normal.     Pupils: Pupils are equal, round, and reactive to light.  Neck:     Thyroid: No thyromegaly.  Cardiovascular:     Rate and Rhythm: Normal rate and regular rhythm.     Heart sounds: Normal heart sounds.  Pulmonary:     Effort: Pulmonary effort is normal. No respiratory distress.     Breath sounds: Wheezing (Forced/end expiratory wheeze right greater than left, otherwise clear.) present.  Abdominal:     General: There is no distension.     Palpations: Abdomen is soft.     Tenderness: There is no abdominal tenderness.  Musculoskeletal:        General: No tenderness. Normal range of motion.     Cervical back: Normal range of motion and neck supple.  Lymphadenopathy:     Cervical: No cervical adenopathy.  Skin:    General: Skin is warm and dry.  Neurological:     Mental Status: He is alert and oriented to person, place, and time.     Deep Tendon Reflexes: Reflexes are normal and symmetric.  Psychiatric:        Behavior: Behavior normal.        Assessment & Plan:  Shivan Hodes is a 32 y.o. male . Annual physical exam  - -anticipatory guidance as below in AVS, screening labs above -he will send copy of recent labs at work.. Health maintenance items as above in HPI discussed/recommended as applicable.   Hyperlipidemia, unspecified hyperlipidemia type Tolerating Crestor, labs work recently, deferred lab work today.  Secondary hyperparathyroidism, non-renal Continue vitamin D supplementation.  Recent labs reportedly normal.  Allergic rhinitis, unspecified seasonality, unspecified trigger Wheezing - Plan: Budesonide 90 MCG/ACT inhaler, albuterol (VENTOLIN HFA) 108 (90  Base) MCG/ACT inhaler Continue antihistamine, Astepro plus or minus Flonase for allergies.  Slight wheezing on exam, suspected component of reactive airway versus mild asthma.  Albuterol up to  every 4-6 hours as needed with potential side effects discussed.  If persistent or frequent use start Pulmicort inhaler twice per day, then taper over the next week or 2 if symptoms improve.  RTC precautions given.  Meds ordered this encounter  Medications   Budesonide 90 MCG/ACT inhaler    Sig: Inhale 1 puff into the lungs 2 (two) times daily.    Dispense:  1 each    Refill:  1   albuterol (VENTOLIN HFA) 108 (90 Base) MCG/ACT inhaler    Sig: Inhale 1-2 puffs into the lungs every 4 (four) hours as needed for wheezing or shortness of breath.    Dispense:  1 each    Refill:  0   Patient Instructions  Please send me a copy of work labs. If A1c is elevated, would recommend repeat testing in 3 months.  Albuterol up to every 4 hours if needed for wheezing but if you require that consistently in the next day or 2, or persistent needed beyond the next day or 2, I do recommend starting the Pulmicort inhaler twice per day.  That should help calm down wheezing.  Once symptoms improve over the next week or 2 can try stopping that steroid inhaler.  Continue to treat allergies with Astepro, Allegra, and Flonase if needed.  If any worsening symptoms let me know.  Thanks for coming in today and take care.  Preventive Care 87-45 Years Old, Male Preventive care refers to lifestyle choices and visits with your health care provider that can promote health and wellness. Preventive care visits are also called wellness exams. What can I expect for my preventive care visit? Counseling During your preventive care visit, your health care provider may ask about your: Medical history, including: Past medical problems. Family medical history. Current health, including: Emotional well-being. Home life and relationship well-being. Sexual activity. Lifestyle, including: Alcohol, nicotine or tobacco, and drug use. Access to firearms. Diet, exercise, and sleep habits. Safety issues such as seatbelt and bike helmet  use. Sunscreen use. Work and work Astronomer. Physical exam Your health care provider may check your: Height and weight. These may be used to calculate your BMI (body mass index). BMI is a measurement that tells if you are at a healthy weight. Waist circumference. This measures the distance around your waistline. This measurement also tells if you are at a healthy weight and may help predict your risk of certain diseases, such as type 2 diabetes and high blood pressure. Heart rate and blood pressure. Body temperature. Skin for abnormal spots. What immunizations do I need?  Vaccines are usually given at various ages, according to a schedule. Your health care provider will recommend vaccines for you based on your age, medical history, and lifestyle or other factors, such as travel or where you work. What tests do I need? Screening Your health care provider may recommend screening tests for certain conditions. This may include: Lipid and cholesterol levels. Diabetes screening. This is done by checking your blood sugar (glucose) after you have not eaten for a while (fasting). Hepatitis B test. Hepatitis C test. HIV (human immunodeficiency virus) test. STI (sexually transmitted infection) testing, if you are at risk. Talk with your health care provider about your test results, treatment options, and if necessary, the need for more tests. Follow these instructions at home:  Eating and drinking  Eat a healthy diet that includes fresh fruits and vegetables, whole grains, lean protein, and low-fat dairy products. Drink enough fluid to keep your urine pale yellow. Take vitamin and mineral supplements as recommended by your health care provider. Do not drink alcohol if your health care provider tells you not to drink. If you drink alcohol: Limit how much you have to 0-2 drinks a day. Know how much alcohol is in your drink. In the [place] one drink equals one 12 oz bottle of beer (355 mL), one 5 oz  glass of wine (148 mL), or one 1 oz glass of hard liquor (44 mL). Lifestyle Brush your teeth every morning and night with fluoride toothpaste. Floss one time each day. Exercise for at least 30 minutes 5 or more days each week. Do not use any products that contain nicotine or tobacco. These products include cigarettes, chewing tobacco, and vaping devices, such as e-cigarettes. If you need help quitting, ask your health care provider. Do not use drugs. If you are sexually active, practice safe sex. Use a condom or other form of protection to prevent STIs. Find healthy ways to manage stress, such as: Meditation, yoga, or listening to music. Journaling. Talking to a trusted person. Spending time with friends and family. Minimize exposure to UV radiation to reduce your risk of skin cancer. Safety Always wear your seat belt while driving or riding in a vehicle. Do not drive: If you have been drinking alcohol. Do not ride with someone who has been drinking. If you have been using any mind-altering substances or drugs. While texting. When you are tired or distracted. Wear a helmet and other protective equipment during sports activities. If you have firearms in your house, make sure you follow all gun safety procedures. Seek help if you have been physically or sexually abused. What's next? Go to your health care provider once a year for an annual wellness visit. Ask your health care provider how often you should have your eyes and teeth checked. Stay up to date on all vaccines. This information is not intended to replace advice given to you by your health care provider. Make sure you discuss any questions you have with your health care provider. Document Revised: 10/11/2020 Document Reviewed: 10/11/2020 Elsevier Patient Education  2023 Elsevier Inc.   Bronchospasm, Adult  Bronchospasm is a tightening of the smooth muscle that wraps around the small airways in the lungs. When the muscle  tightens, the small airways narrow. Narrowed airways limit the air you breathe in or out of your lungs. Inflammation (swelling) and more mucus (sputum) than usual can further irritate the airways. This can make it very hard to breathe. Bronchospasm can happen suddenly or over a period of time. What are the causes? Common causes of this condition include: An infection, such as a cold or sinus drainage. Exercise. Strong odors from aerosol sprays, and fumes from perfume, candles, and household cleaners. Cold air. Stress or strong emotions such as crying or laughing. What increases the risk? The following factors may make you more likely to develop this condition: Having asthma. Smoking or being around someone who smokes (secondhand smoke). Seasonal allergies, such as pollen or mold. Allergic reaction (anaphylaxis) to food, medicine, or insect bites or stings. What are the signs or symptoms? Symptoms of this condition include: Making a high-pitched whistling sound when you breathe, most often when you breathe out (wheezing). Coughing. Chest tightness. Shortness of breath. Decreased ability to exercise. Noisy breathing or  a high-pitched cough. How is this diagnosed? This condition may be diagnosed based on your medical history and a physical exam. Your health care provider may also perform tests, including: A chest X-ray. Lung function tests. How is this treated? This condition may be treated by: Using inhaled medicines. These open up (relax) the airways and help you breathe. They can be taken with a metered dose inhaler or a nebulizer device. Taking corticosteroid medicines. These may be given to reduce inflammation and swelling. Removing the irritant or trigger that started the bronchospasm. Follow these instructions at home: Medicines Take over-the-counter and prescription medicines only as told by your health care provider. If you need to use an inhaler or nebulizer to take your  medicine, ask your health care provider how to use it correctly. You may be given a spacer to use with your inhaler. This makes it easier to get the medicine from the inhaler into your lungs. Lifestyle Do not use any products that contain nicotine or tobacco. These products include cigarettes, chewing tobacco, and vaping devices, such as e-cigarettes. If you need help quitting, ask your health care provider. Keep track of things that trigger your bronchospasm. Avoid these if possible. When pollen, air pollution, or humidity levels are bad, keep windows closed and use an air conditioner or go to places that have air conditioning. Find ways to manage stress and your emotions, such as mindfulness, relaxation, or breathing exercises. Activity Some people have bronchospasm when they exercise. This is called exercise-induced bronchoconstriction (EIB). If you have this problem, talk with your health care provider about how to manage EIB. Some tips include: Using your fast-acting inhaler before exercise. Exercising indoors if it is very cold or humid, or if the pollen and mold counts are high. Warming up and cool down before and after exercise. Stopping exercising right away if your symptoms start or get worse. General instructions If you have asthma, make sure you have an asthma action plan. Stay up to date on your immunizations. Keep all follow-up visits. This is important. Get help right away if: You have trouble breathing. Your wheezing and coughing do not get better after taking your medicine. You have chest pain. You have trouble speaking more than one-word sentences. These symptoms may be an emergency. Get help right away. Call 911. Do not wait to see if the symptoms will go away. Do not drive yourself to the hospital. Summary Bronchospasm is a tightening of the smooth muscle that wraps around the small airways in the lungs. Some people have bronchospasm when they exercise. This is called  exercise-induced bronchoconstriction (EIB). If you have this problem, talk with your health care provider about how to manage EIB. Do not use any products that contain nicotine or tobacco. These products include cigarettes, chewing tobacco, and vaping devices, such as e-cigarettes. If you need help quitting, ask your health care provider. Get help right away if your wheezing and coughing do not get better after taking your medicine. This information is not intended to replace advice given to you by your health care provider. Make sure you discuss any questions you have with your health care provider. Document Revised: 11/06/2020 Document Reviewed: 11/06/2020 Elsevier Patient Education  2023 Elsevier Inc.      Signed,   Meredith Staggers, MD [facility], [facility] 08/09/22 10:06 AM

## 2022-08-09 NOTE — Telephone Encounter (Signed)
I will try qvar. Let me know if that is not covered and what his insurance will cover. Thanks.

## 2022-08-19 ENCOUNTER — Ambulatory Visit
Admission: RE | Admit: 2022-08-19 | Discharge: 2022-08-19 | Disposition: A | Discharge status: Home / Self Care | Payer: BC Managed Care – PPO | Source: Ambulatory Visit | Attending: Family Medicine | Admitting: Family Medicine

## 2022-08-19 DIAGNOSIS — M65871 Other synovitis and tenosynovitis, right ankle and foot: Secondary | ICD-10-CM | POA: Diagnosis not present

## 2022-08-19 DIAGNOSIS — G8929 Other chronic pain: Secondary | ICD-10-CM

## 2022-08-19 DIAGNOSIS — S86311A Strain of muscle(s) and tendon(s) of peroneal muscle group at lower leg level, right leg, initial encounter: Secondary | ICD-10-CM | POA: Diagnosis not present

## 2022-08-19 DIAGNOSIS — M25471 Effusion, right ankle: Secondary | ICD-10-CM | POA: Diagnosis not present

## 2022-08-20 ENCOUNTER — Other Ambulatory Visit: Payer: BC Managed Care – PPO

## 2022-08-29 NOTE — Progress Notes (Signed)
[name] [address] [place] [address] Phone: [phone] Subjective:   Marco Reynolds, am serving as a scribe for Dr. Antoine Primas.  I'm seeing this patient by the request  of:  Shade Flood, MD  CC: Ankle pain follow-up  [id number]  07/31/2022 Patient does have a right ankle effusion noted.  Does have a double line that can be consistent with questionable uric acid deposits.  We discussed that there is some narrowing of the ankle joint and will get x-rays to further evaluate.  Increase activity slowly.  Follow-up with me again in 6 to 8 weeks discussed quickly taking prednisone as well as over-the-counter medications.     Update 08/30/2022 Marco Reynolds is a 32 y.o. male coming in with complaint of R ankle pain. Patient states his pain is the same as last visit. Pain is worse in the mornings and at bedtime. Using orthotics.   MRI showed partial tearing of the peroneal tendon as well as effusion of the right ankle with mild synovitis.     Past Medical History:  Diagnosis Date   Allergy    Past Surgical History:  Procedure Laterality Date   MENISCUS REPAIR Right    Social History   Socioeconomic History   Marital status: Married    Spouse name: Not on file   Number of children: Not on file   Years of education: Not on file   Highest education level: Not on file  Occupational History   Not on file  Tobacco Use   Smoking status: Never   Smokeless tobacco: Former    Types: Chew    Quit date: 2020  Substance and Sexual Activity   Alcohol use: Yes    Alcohol/week: 2.0 standard drinks of alcohol    Types: 2 Cans of beer per week    Comment: occ   Drug use: Never   Sexual activity: Yes    Birth control/protection: None  Other Topics Concern   Not on file  Social History Narrative   Not on file   Social Determinants of Health   Financial Resource Strain: Not on file  Food Insecurity: Not on file   Transportation Needs: Not on file  Physical Activity: Not on file  Stress: Not on file  Social Connections: Not on file   No Known Allergies Family History  Problem Relation Age of Onset   Healthy Mother    Gestational diabetes Mother    Miscarriages / [place] Mother    Healthy Father    Hyperlipidemia Father    Hypertension Father    Kidney disease Father    Gout Father    Healthy Brother    Healthy Brother      Current Outpatient Medications (Cardiovascular):    rosuvastatin (CRESTOR) 10 MG tablet, Take 10 mg by mouth daily.  Current Outpatient Medications (Respiratory):    albuterol (VENTOLIN HFA) 108 (90 Base) MCG/ACT inhaler, Inhale 1-2 puffs into the lungs every 4 (four) hours as needed for wheezing or shortness of breath.   azelastine (ASTELIN) 0.1 % nasal spray, 1 -2 puffs in each nostril Nasally Once a day for 30 day(s)   beclomethasone (QVAR REDIHALER) 80 MCG/ACT inhaler, Inhale 1 puff into the lungs 2 (two) times daily.   fexofenadine (ALLEGRA) 180 MG tablet, 1 tablet Swallow whole with water; do not take with fruit juices. Orally Once a day for 90 days   fluticasone (FLONASE) 50 MCG/ACT nasal spray, 1 spray in  each nostril Nasally Once a day for 90 days (Patient not taking: Reported on 08/09/2022)  Current Outpatient Medications (Analgesics):    indomethacin (INDOCIN) 50 MG capsule,    Current Outpatient Medications (Other):    Ascorbic Acid (VITAMIN C) 500 MG CHEW, 1 tablet Orally Twice a week   hydrOXYzine (ATARAX) 10 MG tablet, 1 tablet as needed Orally every 6-8 hrs for anxiety for 30 days (Patient not taking: Reported on 08/09/2022)   Multiple Vitamin (MULTIVITAMIN ADULT) TABS, Orally   Omega-3 Fatty Acids (FISH OIL BURP-LESS PO), Take by mouth.   Vitamin D, Cholecalciferol, 25 MCG (1000 UT) TABS, 1 tablet Orally twice weekly   Reviewed prior external information including notes and imaging from  primary care provider As well as notes that were  available from care everywhere and other healthcare systems.  Past medical history, social, surgical and family history all reviewed in electronic medical record.  No pertanent information unless stated regarding to the chief complaint.   Review of Systems:  No headache, visual changes, nausea, vomiting, diarrhea, constipation, dizziness, abdominal pain, skin rash, fevers, chills, night sweats, weight loss, swollen lymph nodes, body aches, joint swelling, chest pain, shortness of breath, mood changes. POSITIVE muscle aches  Objective  Blood pressure 108/72, pulse 86, height 5\' 10"  (1.778 m), weight 212 lb (96.2 kg), SpO2 98 %.   General: No apparent distress alert and oriented x3 mood and affect normal, dressed appropriately.  HEENT: Pupils equal, extraocular movements intact  Respiratory: Patient's speak in full sentences and does not appear short of breath  Cardiovascular: No lower extremity edema, non tender, no erythema  Right ankle still shows swelling and tenderness over the lateral aspect.  Procedure: Real-time Ultrasound Guided Injection of right peroneal tendon sheath Device: GE Logiq Q7 Ultrasound guided injection is preferred based studies that show increased duration, increased effect, greater accuracy, decreased procedural pain, increased response rate, and decreased cost with ultrasound guided versus blind injection.  Verbal informed consent obtained.  Time-out conducted.  Noted no overlying erythema, induration, or other signs of local infection.  Skin prepped in a sterile fashion.  Local anesthesia: Topical Ethyl chloride.  With sterile technique and under real time ultrasound guidance: With a 25-gauge half inch needle injected with 0.5 cc of 0.5% Marcaine and 0.5 cc of Kenalog 40 mg/mL Completed without difficulty  Pain immediately resolved suggesting accurate placement of the medication.  Advised to call if fevers/chills, erythema, induration, drainage, or persistent  bleeding.  Impression: Technically successful ultrasound guided injection.  Procedure: Real-time Ultrasound Guided Injection of right ankle joint Device: GE Logiq Q7 Ultrasound guided injection is preferred based studies that show increased duration, increased effect, greater accuracy, decreased procedural pain, increased response rate, and decreased cost with ultrasound guided versus blind injection.  Verbal informed consent obtained.  Time-out conducted.  Noted no overlying erythema, induration, or other signs of local infection.  Skin prepped in a sterile fashion.  Local anesthesia: Topical Ethyl chloride.  With sterile technique and under real time ultrasound guidance: With a 25-gauge half inch needle injected with 0.5 cc of 0.5% Marcaine and 0.5 cc of Kenalog 40 mg/mL Completed without difficulty  Pain immediately resolved suggesting accurate placement of the medication.  Advised to call if fevers/chills, erythema, induration, drainage, or persistent bleeding.  Impression: Technically successful ultrasound guided injection.   Impression and Recommendations:    The above documentation has been reviewed and is accurate and complete Judi Saa, DO

## 2022-08-30 ENCOUNTER — Encounter: Payer: Self-pay | Admitting: Family Medicine

## 2022-08-30 ENCOUNTER — Other Ambulatory Visit: Payer: Self-pay

## 2022-08-30 ENCOUNTER — Ambulatory Visit (INDEPENDENT_AMBULATORY_CARE_PROVIDER_SITE_OTHER): Payer: BC Managed Care – PPO | Admitting: Family Medicine

## 2022-08-30 VITALS — BP 108/72 | HR 86 | Ht 70.0 in | Wt 212.0 lb

## 2022-08-30 DIAGNOSIS — S86311A Strain of muscle(s) and tendon(s) of peroneal muscle group at lower leg level, right leg, initial encounter: Secondary | ICD-10-CM | POA: Insufficient documentation

## 2022-08-30 DIAGNOSIS — M25571 Pain in right ankle and joints of right foot: Secondary | ICD-10-CM

## 2022-08-30 DIAGNOSIS — G8929 Other chronic pain: Secondary | ICD-10-CM

## 2022-08-30 DIAGNOSIS — M25471 Effusion, right ankle: Secondary | ICD-10-CM

## 2022-08-30 NOTE — Assessment & Plan Note (Signed)
After MRI I decided to give an injection into the tibiotalar joint.  Patient tolerated the procedure well.  Improvement noted almost immediately.  I am concerned for some of the peroneal tendon sheath.  Will see how patient responds to this and continue to increase activity.  Follow-up again in 6 to 8 weeks

## 2022-08-30 NOTE — Patient Instructions (Addendum)
Injected foot in 2 spots today See me in 6-8 weeks

## 2022-08-30 NOTE — Assessment & Plan Note (Signed)
Patient given injection and tolerated the procedure well.  Discussed icing regimen and home exercises, increase the activity as tolerated.  Still discussed with him what activities to do and which ones to avoid.  Follow-up again in 6 to 8 weeks

## 2022-09-12 ENCOUNTER — Ambulatory Visit: Payer: BC Managed Care – PPO | Admitting: Family Medicine

## 2022-09-25 ENCOUNTER — Ambulatory Visit (INDEPENDENT_AMBULATORY_CARE_PROVIDER_SITE_OTHER): Payer: BC Managed Care – PPO | Admitting: Podiatry

## 2022-09-25 ENCOUNTER — Encounter: Payer: Self-pay | Admitting: Podiatry

## 2022-09-25 DIAGNOSIS — M25871 Other specified joint disorders, right ankle and foot: Secondary | ICD-10-CM

## 2022-09-25 DIAGNOSIS — M2540 Effusion, unspecified joint: Secondary | ICD-10-CM

## 2022-09-25 DIAGNOSIS — M130 Polyarthritis, unspecified: Secondary | ICD-10-CM | POA: Diagnosis not present

## 2022-09-25 DIAGNOSIS — M25371 Other instability, right ankle: Secondary | ICD-10-CM

## 2022-09-25 DIAGNOSIS — S86311A Strain of muscle(s) and tendon(s) of peroneal muscle group at lower leg level, right leg, initial encounter: Secondary | ICD-10-CM

## 2022-09-25 MED ORDER — DICLOFENAC SODIUM 75 MG PO TBEC
75.0000 mg | DELAYED_RELEASE_TABLET | Freq: Two times a day (BID) | ORAL | 1 refills | Status: DC
Start: 2022-09-25 — End: 2022-11-25

## 2022-09-25 NOTE — Patient Instructions (Signed)
 Chronic Ankle Instability & Rehab Chronic Ankle Instability Chronic ankle instability is a condition that makes the ankle weak and more likely to give way. The condition is common among athletes, especially those with prior ankle ligament injury. Ligaments are strong tissues that connectbones to each other. What are the causes?  This condition is caused by multiple ankle sprains that have not healedproperly, leaving the ankle ligaments loose or damaged. What increases the risk? This condition is more likely to develop in people who participate in sports in which there is a risk of spraining an ankle. These sports include: Cross-country trail running. Basketball. Baseball. Tennis. Football. Soccer. What are the signs or symptoms? Symptoms of this condition include: Rolling your ankle repeatedly. Swelling. Pain. Bruising. Tenderness. Feeling wobbly or unsteady on your foot. Difficulty walking on uneven surfaces or in the dark. How is this diagnosed? This condition may be diagnosed based on: Your symptoms. Your medical history. A physical exam. Your health care provider will check your balance, strength, and range of motion. He or she will also check your injured ankle against your healthy ankle. Imaging tests, such as: An X-ray. A CT scan. An MRI. An ultrasound. How is this treated? Treatment for this condition may include: Wearing a removable boot, brace, or splint. Wearing supportive shoes or shoe inserts. Applying ice to the ankle to reduce swelling. Taking anti-inflammatory pain medicine. Doing exercises (physical therapy). Not putting any body weight, or putting only limited body weight, on your ankle for several days. Gradually returning to full activity. Surgery to repair damaged ligaments. Usually, surgery is needed only if the condition is severe or if othertreatments do not work. Follow these instructions at home: If you have a boot, brace, or splint: Wear it  as told by your health care provider. Remove it only as told by your health care provider. Loosen it if your toes tingle, become numb, or turn cold and blue. Keep it clean. If it is not waterproof: Do not let it get wet. Cover it with a watertight covering when you take a bath or a shower. Ask your health care provider when it is safe to drive with a boot, brace, or splint on your foot. Managing pain, stiffness, and swelling  If directed, put ice on the injured area. If you have a removable boot, brace, or splint, remove it as told by your health care provider. Put ice in a plastic bag. Place a towel between your skin and the bag. Leave the ice on for 20 minutes, 2-3 times a day. Move your toes, foot, and ankle often to reduce stiffness and swelling. Raise (elevate) the injured area above the level of your heart while you are sitting or lying down.  Activity Return to your normal activities as told by your health care provider. Ask your health care provider what activities are safe for you. Do not put your full body weight on your ankle until your health care provider says that you can. Do not do any activities that make pain or swelling worse. Do exercises as told by your health care provider. General instructions Take over-the-counter and prescription medicines only as told by your health care provider. Wear supportive shoes or inserts as told by your health care provider. Keep all follow-up visits as told by your health care provider. This is important. How is this prevented? Wear supportive footwear that is appropriate for your athletic activity. Avoid athletic activities that cause pain or swelling in your ankle. See your health   care provider if you have an ankle sprain that causes pain and swelling for more than 2-4 weeks. Do ankle range-of-motion and strengthening exercises as told by your health care provider before beginning any athletic activity. If you start a new athletic  activity, start gradually to build up your strength and flexibility. Contact a health care provider if: Your condition is not getting better after 2-4 weeks of treatment. You cannot put body weight on your ankle without feeling more pain. Summary Chronic ankle instability is a condition that makes the ankle weak and more likely to give way. This condition is caused by multiple ankle sprains that have not healed properly, leaving the ankle ligaments loose or damaged. Treatment includes wearing a boot, brace, or splint, taking medicines for pain and inflammation, and using ice on the affected area. Follow your health care provider's instructions for caring for your ankle during recovery. Contact your health care provider if your ankle does not get better in 2-4 weeks, or if you cannot put weight on your ankle without feeling more pain. This information is not intended to replace advice given to you by your health care provider. Make sure you discuss any questions you have with your healthcare provider. Document Revised: 01/27/2018 Document Reviewed: 01/27/2018 Elsevier Patient Education  2022 [employer]    Ask your health care provider which exercises are safe for you. Do exercises exactly as told by your health care provider and adjust them as directed. It is normal to feel mild stretching, pulling, tightness, or discomfort as you do these exercises. Stop right away if you feel sudden pain or your pain gets worse. Do not begin these exercises until told by your health care provider. Strengthening exercises These exercises build strength and endurance in your ankle. Endurance is theability to use your muscles for a long time, even after they get tired. Ankle eversion Sit on the floor with your legs straight out in front of you. Loop a rubber exercise band around the ball of your left / right foot. The ball of your foot is on the walking surface, right under your toes. Hold the ends of the band  in your hands, or secure the band to a stable object. Slowly push your foot outward, away from your other leg (eversion). Hold this position for 10 seconds. Slowly return your foot to the starting position. Repeat 10 times. Complete this exercise 2 times a day. Heel walking  This exercise strengthens the muscles that push the ankle backward to point your toes toward your knee (ankle dorsiflexors). Walk on your heels for 10 ft. Keep your toes as high as possible. Repeat 10 times. Complete this exercise 2  times per day. Toe walking  This exercise strengthens the muscles that push the ankle forward and your toes downward (ankle plantar flexors). Walk on your toes for 10 ft. Keep your heels as high as possible. Repeat 10  times. Complete this exercise 2  times per day. Balance exercises These exercises improve or maintain your balance. Balance is important inimproving ankle stability and preventing falls. Tandem walking Do this exercise in a hallway or room that is at least 10 ft (3 m) long. Stand with one foot directly in front of the other (tandem). You can use the walls to help you balance if needed, but try not to use them for support. Slowly lift your back foot and place it directly in front of your other foot. Continue to walk in this heel-to-toe way for 10   ft. Repeat 10  times. Complete this exercise 2  times a day. Single leg stand Without shoes, stand near a railing or in a doorway. You may hold on to the railing or door frame as needed for balance. Stand on your left / right foot. Keep your big toe down on the floor and try to keep your arch lifted. If this is too easy, you can try doing it while you do one of these actions: Keep your eyes closed. Stand on a pillow. Throw a ball against a wall and catch it when it returns. Hold this position for 10 seconds. Repeat 10 times. Complete this exercise 2 times a day. Ankle inversion and ankle eversion This exercise is also called  foot rotation with a balance board. It uses a balance board to rotate the foot and ankle inward (inversion) and outward (eversion). Ask your health care provider where you can get a balance board or how you can make one. Stand on a non-carpeted surface near a countertop or wall. Step onto the balance board so your feet are hip width apart. Keep your feet in place, and keep your upper body and hips steady. Using only your feet and ankles to move the board, do the following exercises: Tip the board from side to side as far as you can, alternating between tipping to the left and tipping to the right. Tip the board so it silently taps the floor. Do not let the board forcefully hit the floor. From time to time, pause to hold a steady midway position, with neither the right nor the left sides touching the ground. Tip the board side to side so the board does not hit the floor at all. From time to time, pause to hold a steady midway position. Repeat 10 times, pausing from time to time to hold a steady position.Complete this exercise 2 times a day. Ankle plantar flexion and ankle dorsiflexion This exercise is also called foot flexion with a balance board. It uses a balance board to push the foot downward and away from the leg (plantar flexion) or upward and toward the leg (dorsiflexion). Ask your health care provider where you can get a balance board or how you can make one. Stand on a non-carpeted surface near a countertop or wall. Step onto the balance board so your feet are hip width apart. Keep your feet in place, and keep your upper body and hips steady. Using only your feet and ankles, do the following exercises: Tip the board forward and backward so the board silently taps the floor. Do not let the board forcefully hit the floor. From time to time, pause to hold a steady position midway between touching the floor in front and touching the floor in back. Tip the board forward and backward so the board  does not hit the floor at all. From time to time, pause to hold a steady position. Repeat 10 times, pausing from time to time to hold a steady position.Complete this exercise 2 times a day. This information is not intended to replace advice given to you by your health care provider. Make sure you discuss any questions you have with your healthcare provider. Document Revised: 08/06/2018 Document Reviewed: 01/27/2018 Elsevier Patient Education  2022 [employer] Inc.  

## 2022-09-26 LAB — CBC WITH DIFFERENTIAL/PLATELET
MCH: 28.2 pg (ref 26.6–33.0)
MCV: 85 fL (ref 79–97)
Monocytes: 8 %
Neutrophils: 75 %
Platelets: 263 10*3/uL (ref 150–450)

## 2022-09-26 LAB — C-REACTIVE PROTEIN: CRP: 43 mg/L — ABNORMAL HIGH (ref 0–10)

## 2022-09-27 ENCOUNTER — Ambulatory Visit (INDEPENDENT_AMBULATORY_CARE_PROVIDER_SITE_OTHER): Payer: BC Managed Care – PPO | Admitting: Family Medicine

## 2022-09-27 VITALS — BP 126/84 | HR 97 | Temp 98.0°F | Resp 18 | Ht 70.0 in | Wt 213.0 lb

## 2022-09-27 DIAGNOSIS — J029 Acute pharyngitis, unspecified: Secondary | ICD-10-CM

## 2022-09-27 LAB — CBC WITH DIFFERENTIAL/PLATELET
Basophils Absolute: 8 cells/uL (ref 0–200)
EOS (ABSOLUTE): 0 10*3/uL (ref 0.0–0.4)
Hematocrit: 43.2 % (ref 37.5–51.0)
Hemoglobin: 14.5 g/dL (ref 13.2–17.1)
Lymphocytes Absolute: 1.6 10*3/uL (ref 0.7–3.1)
Lymphs Abs: 2321 cells/uL (ref 850–3900)
MCH: 28.7 pg (ref 27.0–33.0)
Monocytes Absolute: 0.8 10*3/uL (ref 0.1–0.9)
Neutro Abs: 5305 cells/uL (ref 1500–7800)
Neutrophils Relative %: 64.7 %
Platelets: 289 10*3/uL (ref 140–400)
RBC: 5.11 x10E6/uL (ref 4.14–5.80)
RDW: 12.3 % (ref 11.6–15.4)
WBC: 8.2 10*3/uL (ref 3.8–10.8)

## 2022-09-27 LAB — SEDIMENTATION RATE: Sed Rate: 21 mm/hr — ABNORMAL HIGH (ref 0–15)

## 2022-09-27 LAB — POCT MONO (EPSTEIN BARR VIRUS): Mono, POC: NEGATIVE

## 2022-09-27 MED ORDER — LIDOCAINE VISCOUS HCL 2 % MT SOLN: 5.0000 mL | Freq: Four times a day (QID) | OROMUCOSAL | 0 refills | Status: DC

## 2022-09-27 NOTE — Progress Notes (Signed)
   Subjective:    Patient ID: Marco Reynolds, male    DOB: 08/14/1990, 32 y.o.   MRN: [id number]  HPI Sore throat- saw NP at work who did a flu, strep and COVID test on Tuesday.  All were negative.  Throat and ear pain are worse so she gave him a Zpack yesterday and told him to be tested for mono.  Sxs started Sunday.  No fevers or body aches currently but did have fever earlier this week.  + swollen and tender glands.  No known sick contacts.   Review of Systems For ROS see HPI     Objective:   Physical Exam Vitals reviewed.  Constitutional:      General: He is not in acute distress.    Appearance: He is not ill-appearing.  HENT:     Head: Normocephalic and atraumatic.     Right Ear: Tympanic membrane and ear canal normal.     Left Ear: Tympanic membrane and ear canal normal.     Nose: No congestion or rhinorrhea.     Mouth/Throat:     Mouth: Mucous membranes are moist. Oral lesions (aphthous ulcer on L side wall, anterior to tonsil) present.     Pharynx: No oropharyngeal exudate.     Tonsils: No tonsillar exudate. 0 on the right. 0 on the left.  Eyes:     Conjunctiva/sclera: Conjunctivae normal.  Musculoskeletal:     Cervical back: Neck supple.  Lymphadenopathy:     Cervical: No cervical adenopathy.  Skin:    General: Skin is warm and dry.  Neurological:     General: No focal deficit present.     Mental Status: He is alert and oriented to person, place, and time.           Assessment & Plan:  Sore throat- new.  Pt has already had negative strep, flu, and COVID tests.  Was started on Zpack yesterday which would cover any bacterial infxn.  Mono POCT was negative but will check blood work to assess CBC and EBV.  Discussed that this was likely viral and will resolve w/ time.  Did discuss possibility of HFM due to the lesion/ulcer present.  At this time he has no other rashes or lesions.  Start magic mouthwash for pain relief.  Reviewed supportive care and red flags  that should prompt return.  Pt expressed understanding and is in agreement w/ plan.

## 2022-09-27 NOTE — Patient Instructions (Signed)
Follow up as needed or as scheduled We'll notify you of your lab results and make any changes if needed I suspect this is a viral illness and will improve w/ time Complete the Zpack as directed to take care of any possible bacterial infection Use the Magic mouthwash- swish and spit- for pain control Ibuprofen as needed for pain or fever Drink LOTS of fluids REST! Call with any questions or concerns Hang in there!

## 2022-09-27 NOTE — Progress Notes (Signed)
Subjective:  Patient ID: Marco Reynolds, male    DOB: 27-Oct-1990,  MRN: [id number]  Chief Complaint  Patient presents with   Ankle Pain    NP- R ankle in pain (MRI shows tear) Swelling - he has tried several different treatments. Dr. Katrinka Blazing is recommending PRP injection - He is here for second opinion before he moves forward with any further treatment    32 y.o. male presents with the above complaint. History confirmed with patient.  Had an inversion type ankle injury last year, reaggravated this a few months later.  Has been seeing sports medicine for it.  Around Christmas of last year was doing very well and improved, then in March woke up 1 day with significant pain and swelling around the ankle that worsened again.  Most recently had MRI completed and multiple ultrasounds and has had an injection into the peroneal tendons and into the ankle joint.  The ultrasound was concerning for gout or pseudogout.  He does note that he was diagnosed with pseudogout a few years ago in the knee and this was drained and injected successfully  Objective:  Physical Exam: warm, good capillary refill, no trophic changes or ulcerative lesions, normal DP and PT pulses, normal sensory exam, and pain and tenderness over anterior joint line especially lateral ankle gutter, he does not have pain over the peroneal tendons or with resisted eversion   Radiographs: Ankle radiographs 08/02/2022 reviewed show no acute osseous abnormalities  Study Result  Narrative & Impression  CLINICAL DATA:  Medial and lateral right ankle pain. Sprained ankle 7 months ago.   EXAM: MRI OF THE RIGHT ANKLE WITHOUT CONTRAST   TECHNIQUE: Multiplanar, multisequence MR imaging of the ankle was performed. No intravenous contrast was administered.   COMPARISON:  Right ankle radiographs 07/31/2022   FINDINGS: TENDONS   Peroneal: There is mild intermediate T2 signal and a mild "chevron configuration" of the peroneus brevis  tendon starting at the distal aspect of the fibula (axial series 4, image 16) and involving an approximate 1.5 cm length of the tendon (axial images 16 through 19, coronal series 7 images 16 through 19). The peroneus longus tendon is intact.   Posteromedial: Mild posterior tibial tenosynovitis. The flexor digitorum longus and flexor hallucis longus tendons are intact.   Anterior: The tibialis anterior, extensor hallucis longus, and extensor digitorum longus tendons are intact.   Achilles: Mild linear longitudinal intermediate T2 signal within the slightly lateral aspect of the midsubstance of the Achilles tendon measuring up to 4 cm in length (sagittal series 6, image 15) but only measuring up to 1 mm in transverse dimension (axial images 8 through 18).   Plantar Fascia: There is minimal marrow edema within the posteroinferior calcaneus at the plantar fascia origin. Minimal edema at the deep aspect of the medial band of the plantar fascia without abnormal thickening (coronal series 7, image 13 and sagittal series 6, image 16).   LIGAMENTS   Lateral: The anterior and posterior talofibular, anterior and posterior tibiofibular, and calcaneofibular ligaments are intact.   Medial: The tibiotalar deep deltoid and tibial spring ligaments are intact.   CARTILAGE   Ankle Joint: Intact cartilage. Mild-to-moderate tibiotalar joint effusion with mild intermediate T2 signal focal synovitis anteriorly (sagittal series 6 images 11 through 14).   Subtalar Joints/Sinus Tarsi: The subtalar cartilage is intact.Fat is preserved within the sinus tarsi.   Bones: Mild dorsal talonavicular cartilage thinning and degenerative osteophytosis.   Other: The tarsal tunnel is unremarkable. The Lisfranc ligament  complex is intact.   There is mild medial and lateral ankle subcutaneous fat edema and swelling.   IMPRESSION: 1. Mild peroneus brevis partial-thickness longitudinal split tear. 2. Mild  posterior tibial tenosynovitis. 3. Minimal marrow edema within the posteroinferior calcaneus at the plantar fascia origin. Minimal edema at the deep aspect of the medial band of the plantar fascia without abnormal thickening. 4. Mild-to-moderate tibiotalar joint effusion with mild focal synovitis anteriorly.     Electronically Signed   By: Neita Garnet M.D.   On: 08/22/2022 11:06   Assessment:   1. Painful swelling of joint   2. Polyarthritis of ankle   3. Peroneal tendon tear, right, initial encounter   4. Ankle impingement syndrome, right   5. Ankle instability, right      Plan:  Patient was evaluated and treated and all questions answered.  Reviewed his progress.  I reviewed his prior studies and lab work.  He does have a history of pseudogout.  Had an inversion ankle injury that was reaggravated again last year.  His MRI shows a peroneal tendon tear but this is relatively asymptomatic.  He just had an ankle injection and tendon sheath injection that was ultrasound-guided couple weeks ago.  I do think he needs more time for this to continue to work and show infection.  From a physical activity standpoint I think he can continue most of his regular activities.  Due to his history of gout and polyarthritic symptoms I did recommend updating some of his lab work and multiple areas of lab work to check for gout pseudogout and rheumatoid arthritis.  Pending results of this may refer to rheumatology for further evaluation.  Meloxicam has not been very helpful so I switched him to Voltaren.  Follow-up with me in 8 weeks or sooner if continuing to be a problem.  Return in about 8 weeks (around 11/20/2022) for follow up on right ankle.

## 2022-09-28 LAB — EPSTEIN-BARR VIRUS VCA ANTIBODY PANEL
EBV NA IgG: 51.6 U/mL — ABNORMAL HIGH
EBV VCA IgG: 29.7 U/mL — ABNORMAL HIGH
EBV VCA IgM: <36 U/mL

## 2022-09-28 LAB — CBC WITH DIFFERENTIAL/PLATELET
Absolute Monocytes: 533 cells/uL (ref 200–950)
Basophils Relative: 0.1 %
Eosinophils Absolute: 33 cells/uL (ref 15–500)
Eosinophils Relative: 0.4 %
HCT: 42.8 % (ref 38.5–50.0)
MCHC: 33.9 g/dL (ref 32.0–36.0)
MCV: 84.8 fL (ref 80.0–100.0)
MPV: 10.6 fL (ref 7.5–12.5)
Monocytes Relative: 6.5 %
RBC: 5.05 10*6/uL (ref 4.20–5.80)
RDW: 12.4 % (ref 11.0–15.0)
Total Lymphocyte: 28.3 %

## 2022-09-30 ENCOUNTER — Telehealth: Payer: Self-pay

## 2022-09-30 NOTE — Telephone Encounter (Signed)
-----   Message from Sheliah Hatch, MD sent at 09/29/2022  1:45 PM EDT ----- Your labs indicate past mono infection but not current infection.  Your blood count is not consistent w/ bacterial infection- which is good news- and this should run its course.  Hope you feel better soon!

## 2022-09-30 NOTE — Telephone Encounter (Signed)
-----   Message from Katherine E Tabori, MD sent at 09/29/2022  1:45 PM EDT ----- Your labs indicate past mono infection but not current infection.  Your blood count is not consistent w/ bacterial infection- which is good news- and this should run its course.  Hope you feel better soon! 

## 2022-09-30 NOTE — Telephone Encounter (Signed)
Left lab results on pt VM 

## 2022-09-30 NOTE — Telephone Encounter (Signed)
Pt has been notified.

## 2022-10-02 LAB — CBC WITH DIFFERENTIAL/PLATELET
Basophils Absolute: 0 10*3/uL (ref 0.0–0.2)
Basos: 0 %
Eos: 0 %
Hemoglobin: 14.4 g/dL (ref 13.0–17.7)
Immature Grans (Abs): 0 10*3/uL (ref 0.0–0.1)
Immature Granulocytes: 0 %
Lymphs: 17 %
MCHC: 33.3 g/dL (ref 31.5–35.7)
Neutrophils Absolute: 6.8 10*3/uL (ref 1.4–7.0)
WBC: 9.1 10*3/uL (ref 3.4–10.8)

## 2022-10-02 LAB — HLA-B27 ANTIGEN: HLA B27: NEGATIVE

## 2022-10-02 LAB — RHEUMATOID ARTHRITIS PROFILE
Cyclic Citrullin Peptide Ab: 2 units (ref 0–19)
Rheumatoid fact SerPl-aCnc: <10 IU/mL (ref <14.0)

## 2022-10-02 LAB — URIC ACID: Uric Acid: 6.2 mg/dL (ref 3.8–8.4)

## 2022-10-10 NOTE — Progress Notes (Signed)
[name] [address] [place] [address] Phone: [phone] Subjective:   [name], am serving as a scribe for Dr. Antoine Primas.  I'm seeing this patient by the request  of:  Shade Flood, MD  CC: Right ankle pain  ION:[id number]  08/30/2022 Patient given injection and tolerated the procedure well.  Discussed icing regimen and home exercises, increase the activity as tolerated.  Still discussed with him what activities to do and which ones to avoid.  Follow-up again in 6 to 8 weeks     After MRI I decided to give an injection into the tibiotalar joint.  Patient tolerated the procedure well.  Improvement noted almost immediately.  I am concerned for some of the peroneal tendon sheath.  Will see how patient responds to this and continue to increase activity.  Follow-up again in 6 to 8 weeks      Update 10/11/2022 Marco Reynolds is a 32 y.o. male coming in with complaint of R ankle pain. Patient states medial side is hurting now. Can't be active. Pain in ankle will wake him at night. Wearing the insoles and icing at home. Take ibuprofen and tylenol to tolerate the pain at work. Had labs late May.      Past Medical History:  Diagnosis Date   Allergy    Past Surgical History:  Procedure Laterality Date   MENISCUS REPAIR Right    Social History   Socioeconomic History   Marital status: Married    Spouse name: Not on file   Number of children: Not on file   Years of education: Not on file   Highest education level: Bachelor's degree (e.g., BA, AB, BS)  Occupational History   Not on file  Tobacco Use   Smoking status: Never   Smokeless tobacco: Former    Types: Chew    Quit date: 2020  Substance and Sexual Activity   Alcohol use: Yes    Alcohol/week: 2.0 standard drinks of alcohol    Types: 2 Cans of beer per week    Comment: occ   Drug use: Never   Sexual activity: Yes    Birth control/protection: None  Other  Topics Concern   Not on file  Social History Narrative   Not on file   Social Determinants of Health   Financial Resource Strain: Low Risk  (09/27/2022)   Overall Financial Resource Strain (CARDIA)    Difficulty of Paying Living Expenses: Not hard at all  Food Insecurity: No Food Insecurity (09/27/2022)   Hunger Vital Sign    Worried About Running Out of Food in the Last Year: Never true    Ran Out of Food in the Last Year: Never true  Transportation Needs: No Transportation Needs (09/27/2022)   PRAPARE - Administrator, Civil Service (Medical): No    Lack of Transportation (Non-Medical): No  Physical Activity: Unknown (09/27/2022)   Exercise Vital Sign    Days of Exercise per Week: Patient declined    Minutes of Exercise per Session: Not on file  Stress: Stress Concern Present (09/27/2022)   [employer] of Occupational Health - Occupational Stress Questionnaire    Feeling of Stress : To some extent  Social Connections: Socially Integrated (09/27/2022)   Social Connection and Isolation Panel [NHANES]    Frequency of Communication with Friends and Family: More than three times a week    Frequency of Social Gatherings with Friends and Family: Once a week  Attends Religious Services: 1 to 4 times per year    Active Member of Clubs or Organizations: Yes    Attends Engineer, structural: More than 4 times per year    Marital Status: Married   No Known Allergies Family History  Problem Relation Age of Onset   Healthy Mother    Gestational diabetes Mother    Miscarriages / [place] Mother    Healthy Father    Hyperlipidemia Father    Hypertension Father    Kidney disease Father    Gout Father    Healthy Brother    Healthy Brother      Current Outpatient Medications (Cardiovascular):    rosuvastatin (CRESTOR) 10 MG tablet, Take 10 mg by mouth daily.  Current Outpatient Medications (Respiratory):    azelastine (ASTELIN) 0.1 % nasal spray, 1 -2  puffs in each nostril Nasally Once a day for 30 day(s)   fexofenadine (ALLEGRA) 180 MG tablet, 1 tablet Swallow whole with water; do not take with fruit juices. Orally Once a day for 90 days   fluticasone (FLONASE) 50 MCG/ACT nasal spray,    magic mouthwash (nystatin, lidocaine, diphenhydrAMINE, alum & mag hydroxide) suspension*, Swish and swallow 5 mLs 4 (four) times daily.  Current Outpatient Medications (Analgesics):    diclofenac (VOLTAREN) 75 MG EC tablet, Take 1 tablet (75 mg total) by mouth 2 (two) times daily.   Current Outpatient Medications (Other):    Ascorbic Acid (VITAMIN C) 500 MG CHEW, 1 tablet Orally Twice a week   hydrOXYzine (ATARAX) 10 MG tablet,    magic mouthwash (nystatin, lidocaine, diphenhydrAMINE, alum & mag hydroxide) suspension*, Swish and swallow 5 mLs 4 (four) times daily.   Multiple Vitamin (MULTIVITAMIN ADULT) TABS, Orally   Omega-3 Fatty Acids (FISH OIL BURP-LESS PO), Take by mouth.   Vitamin D, Cholecalciferol, 25 MCG (1000 UT) TABS, 1 tablet Orally twice weekly * These medications belong to multiple therapeutic classes and are listed under each applicable group.   Reviewed prior external information including notes and imaging from  primary care provider As well as notes that were available from care everywhere and other healthcare systems.  Past medical history, social, surgical and family history all reviewed in electronic medical record.  No pertanent information unless stated regarding to the chief complaint.   Review of Systems:  No headache, visual changes, nausea, vomiting, diarrhea, constipation, dizziness, abdominal pain, skin rash, fevers, chills, night sweats, weight loss, swollen lymph nodes, body aches, joint swelling, chest pain, shortness of breath, mood changes. POSITIVE muscle aches  Objective  Blood pressure 120/78, pulse 83, height 5\' 10"  (1.778 m), weight 216 lb (98 kg), SpO2 96 %.   General: No apparent distress alert and oriented  x3 mood and affect normal, dressed appropriately.  HEENT: Pupils equal, extraocular movements intact  Respiratory: Patient's speak in full sentences and does not appear short of breath  Cardiovascular: No lower extremity edema, non tender, no erythema  Right ankle exam does still have swelling noted on the medial aspect.  Tender to palpation in this area.  Tightness noted mostly of the posterior tibialis tendon  Procedure: Real-time Ultrasound Guided Injection of right posterior tibial tendon sheath Device: GE Logiq Q7 Ultrasound guided injection is preferred based studies that show increased duration, increased effect, greater accuracy, decreased procedural pain, increased response rate, and decreased cost with ultrasound guided versus blind injection.  Verbal informed consent obtained.  Time-out conducted.  Noted no overlying erythema, induration, or other signs of local infection.  Skin prepped in a sterile fashion.  Local anesthesia: Topical Ethyl chloride.  With sterile technique and under real time ultrasound guidance: With a 25-gauge half inch needle injected with 0.5 cc of 0.5% Marcaine and 0.5 cc of Kenalog 40 mg/mL Completed without difficulty  Pain immediately resolved suggesting accurate placement of the medication.  Advised to call if fevers/chills, erythema, induration, drainage, or persistent bleeding.  Impression: Technically successful ultrasound guided injection.    Impression and Recommendations:     The above documentation has been reviewed and is accurate and complete Judi Saa, DO

## 2022-10-11 ENCOUNTER — Other Ambulatory Visit: Payer: Self-pay

## 2022-10-11 ENCOUNTER — Encounter: Payer: Self-pay | Admitting: Family Medicine

## 2022-10-11 ENCOUNTER — Ambulatory Visit (INDEPENDENT_AMBULATORY_CARE_PROVIDER_SITE_OTHER): Payer: BC Managed Care – PPO | Admitting: Family Medicine

## 2022-10-11 VITALS — BP 120/78 | HR 83 | Ht 70.0 in | Wt 216.0 lb

## 2022-10-11 DIAGNOSIS — M25571 Pain in right ankle and joints of right foot: Secondary | ICD-10-CM

## 2022-10-11 DIAGNOSIS — G8929 Other chronic pain: Secondary | ICD-10-CM | POA: Diagnosis not present

## 2022-10-11 DIAGNOSIS — M659 Synovitis and tenosynovitis, unspecified: Secondary | ICD-10-CM

## 2022-10-11 DIAGNOSIS — M65969 Unspecified synovitis and tenosynovitis, unspecified lower leg: Secondary | ICD-10-CM | POA: Insufficient documentation

## 2022-10-11 NOTE — Patient Instructions (Signed)
Injection in ankle today See me again in 8 weeks

## 2022-10-11 NOTE — Assessment & Plan Note (Signed)
Patient given injection and tolerated the procedure well, discussed icing regimen and home exercises.  Discussed that if this does not seem to make improvement we will consider an intra-articular injection as well in the ankle.  Does appear that the peroneal tendons are significantly better at this point.  Follow-up again in 6 to 8 weeks

## 2022-11-03 ENCOUNTER — Encounter: Payer: Self-pay | Admitting: Family Medicine

## 2022-11-05 ENCOUNTER — Encounter: Payer: Self-pay | Admitting: Family Medicine

## 2022-11-12 ENCOUNTER — Ambulatory Visit (INDEPENDENT_AMBULATORY_CARE_PROVIDER_SITE_OTHER): Payer: BC Managed Care – PPO | Admitting: Podiatry

## 2022-11-12 ENCOUNTER — Ambulatory Visit (INDEPENDENT_AMBULATORY_CARE_PROVIDER_SITE_OTHER): Payer: BC Managed Care – PPO

## 2022-11-12 DIAGNOSIS — M84375A Stress fracture, left foot, initial encounter for fracture: Secondary | ICD-10-CM

## 2022-11-12 DIAGNOSIS — M79672 Pain in left foot: Secondary | ICD-10-CM

## 2022-11-12 DIAGNOSIS — M1990 Unspecified osteoarthritis, unspecified site: Secondary | ICD-10-CM | POA: Diagnosis not present

## 2022-11-12 MED ORDER — MELOXICAM 15 MG PO TABS: 15.0000 mg | ORAL_TABLET | Freq: Every day | ORAL | 0 refills | Status: DC | PRN

## 2022-11-12 NOTE — Progress Notes (Signed)
Subjective: Chief Complaint  Patient presents with   Foot Pain    Patient came in today for left foot pain and swelling,  top of the forefoot, started 2 weeks ago, rate of pain 8 out of 10, worst at night, sharp, X-Rays done today    32 year old male presents the office for above concerns.  Over the last weeks he started developing pain on the top of his left foot as well as swelling.  No injuries that he reports.  No recent treatment.  Still ongoing treatment for the right ankle continues to have pain.   Objective: AAO x3, NAD DP/PT pulses palpable bilaterally, CRT less than 3 seconds Exam is a limited left foot today.  There is edema present to dorsal aspect left forefoot, second, third metatarsals and there is pinpoint tenderness noted.  There is no erythema.  No open lesion.  Flexor, extensor tendons appear to be intact. No pain with calf compression, swelling, warmth, erythema  Assessment: Concern for stress fracture left foot  Plan: -All treatment options discussed with the patient including all alternatives, risks, complications.  -X-rays again reviewed.  3 views left foot were obtained.  No definitive evidence of acute fractures today. -Given his symptoms I was immobilized in surgical shoe was dispensed today.  Ice and elevation.  Limit activity. -Given ongoing pain of his right ankle and has been discussed previously rheumatology referral and I placed this referral for him. -Patient encouraged to call the office with any questions, concerns, change in symptoms.   Vivi Barrack DPM

## 2022-11-18 ENCOUNTER — Telehealth: Payer: Self-pay | Admitting: Podiatry

## 2022-11-18 ENCOUNTER — Encounter: Payer: Self-pay | Admitting: Family Medicine

## 2022-11-18 ENCOUNTER — Ambulatory Visit: Payer: BC Managed Care – PPO | Admitting: Family Medicine

## 2022-11-18 ENCOUNTER — Encounter: Payer: Self-pay | Admitting: Podiatry

## 2022-11-18 NOTE — Telephone Encounter (Signed)
Patient was able to get in with Dr. Dimple Casey

## 2022-11-18 NOTE — Telephone Encounter (Signed)
Marco Reynolds would like to have his referral sent to [facility] instead of the one it was sent to. The office he was referred to can't see him until October and [facility] can see him now.

## 2022-11-21 ENCOUNTER — Ambulatory Visit: Payer: BC Managed Care – PPO | Admitting: Podiatry

## 2022-11-24 NOTE — Progress Notes (Unsigned)
Office Visit Note  Patient: Marco Reynolds             Date of Birth: 04-16-1991           MRN: [id number]             PCP: Shade Flood, MD Referring: Vivi Barrack, DPM Visit Date: 11/25/2022 Occupation: HR manager  Subjective:   History of Present Illness: Royalty Reinhardt is a 32 y.o. male here for evaluation of joint pain and swelling mostly in the right ankle but few other joints also involved.  This started since March 16 he woke up with acute onsets of significant pain and swelling.  Tried taking oral anti-inflammatory medications with limited benefit.  Saw Dr. Katrinka Blazing had imaging trial of several steroid injections for concern of ankle inflammation or tendinopathy.  Went for MRI of the ankle in April demonstrated some chronic tendon changes also tibiotalar joint effusion.  In total to lateral and 1 medial steroid injection these were somewhat helpful and lateral ankle pain did improve but incomplete benefit.  During this time was also treated with a round of medication for bronchitis that resolved but did not see relief in joint pain with resolution.  More recently also started to have visible swelling and pain at the base of second and third toes on the left foot and a feeling like a nodule or mass pressing on the bottom of the foot while weightbearing.  Currently taking meloxicam 15 mg daily instead of over-the-counter NSAIDs which is partially controlling symptoms but still having a lot of activity and mobility limitations. Prior to onset of the symptoms he did not recall any new injury or change in activity.  He was sick with uncomplicated course of COVID in January. He had a previous history of right knee and left ankle pain and effusions in 2020 found to have pseudogout confirmed on knee aspirate with intracellular calcium pyrophosphate crystals.  Also around that time did have 1 small kidney stone.  Also with hypercalcemia and hyperparathyroidism these are thought  incidental to hide baseline serum protein level and mild vitamin D insufficiency that resolved.   Activities of Daily Living:  Patient reports morning stiffness for all day. Patient Reports nocturnal pain.  Difficulty dressing/grooming: Reports Difficulty climbing stairs: Reports Difficulty getting out of chair: Reports Difficulty using hands for taps, buttons, cutlery, and/or writing: Denies  Review of Systems  Constitutional:  Positive for fatigue.  HENT:  Negative for mouth sores and mouth dryness.   Eyes:  Negative for redness, visual disturbance and dryness.  Respiratory:  Negative for shortness of breath.   Cardiovascular:  Negative for chest pain and palpitations.  Gastrointestinal:  Negative for blood in stool, constipation and diarrhea.  Endocrine: Positive for increased urination.  Genitourinary:  Negative for involuntary urination.  Musculoskeletal:  Positive for joint pain, gait problem, joint pain, joint swelling, myalgias, muscle weakness, morning stiffness, muscle tenderness and myalgias.  Skin:  Negative for color change, rash and sensitivity to sunlight.  Allergic/Immunologic: Positive for susceptible to infections.  Neurological:  Negative for dizziness and headaches.  Hematological:  Negative for swollen glands.  Psychiatric/Behavioral:  Positive for sleep disturbance. Negative for depressed mood. The patient is not nervous/anxious.     PMFS History:  Patient Active Problem List   Diagnosis Date Noted   Capsulitis of left foot 11/25/2022   High risk medication use 11/25/2022   Tenosynovitis of tibialis posterior tendon 10/11/2022   Peroneal tendon tear, right,  initial encounter 08/30/2022   Ankle effusion, right 07/31/2022   Hypercalcemia 02/04/2020   Epididymitis, left 01/28/2020   Left ankle pain 07/17/2018   Obesity (BMI 30.0-34.9) 04/01/2018   Deviated nasal septum 07/21/2017   Nasal turbinate hypertrophy 07/21/2017   Seasonal allergic rhinitis due to  pollen 12/16/2016    Past Medical History:  Diagnosis Date   Allergy    Effusion of knee joint right 07/17/2018   History of hyperlipidemia     Family History  Problem Relation Age of Onset   Healthy Mother    Gestational diabetes Mother    Miscarriages / [place] Mother    Healthy Father    Hyperlipidemia Father    Hypertension Father    Kidney disease Father    Gout Father    Gout Brother    Healthy Brother    Healthy Brother    Healthy Son    Healthy Daughter    Past Surgical History:  Procedure Laterality Date   MENISCUS REPAIR Right    Social History   Social History Narrative   Not on file   Immunization History  Administered Date(s) Administered   DTaP 03/13/1989, 05/13/1989, 07/08/1992, 07/23/1992, 03/11/1994   HIB (PRP-OMP) 03/13/1989, 05/13/1989, 07/08/1989, 05/07/1990   Hepatitis A, Adult 09/09/2017   Hepatitis B 01/29/2001, 03/05/2001, 07/16/2001   Hepatitis B, ADULT 09/18/2017, 11/04/2017, 01/08/2018   IPV 03/13/1989, 05/13/1989, 07/23/1989, 03/11/1994   Influenza Inj Mdck Quad Pf 02/11/2022   Influenza,inj,Quad PF,6+ Mos 02/13/2017, 02/09/2019   Influenza-Unspecified 02/06/2016, 01/27/2018, 02/07/2020, 02/11/2022   MMR 05/07/1990, 03/11/1994   Meningococcal Mcv4o 09/09/2017   Moderna Sars-Covid-2 Vaccination 07/01/2019, 08/03/2019, 03/02/2020   Tdap 10/24/2006, 07/14/2014     Objective: Vital Signs: BP 124/88 (BP Location: Right Arm, Patient Position: Sitting, Cuff Size: Normal)   Pulse 97   Resp 17   Ht 5\' 10"  (1.778 m)   Wt 215 lb 3.2 oz (97.6 kg)   BMI 30.88 kg/m    Physical Exam HENT:     Mouth/Throat:     Comments: Cobblestoning appearance in throat Eyes:     Conjunctiva/sclera: Conjunctivae normal.  Cardiovascular:     Rate and Rhythm: Regular rhythm. Tachycardia present.  Pulmonary:     Effort: Pulmonary effort is normal.     Breath sounds: Normal breath sounds.  Lymphadenopathy:     Cervical: No cervical adenopathy.   Skin:    General: Skin is warm and dry.     Findings: No rash.  Neurological:     Mental Status: He is alert.  Psychiatric:        Mood and Affect: Mood normal.      Musculoskeletal Exam:  Shoulders full ROM no tenderness or swelling Elbows full ROM no tenderness or swelling Wrists full ROM no tenderness or swelling Fingers full ROM no tenderness or swelling No paraspinal tenderness to palpation over upper and lower back Hip normal internal and external rotation without pain, no tenderness to lateral hip palpation Right ankle swelling and tenderness to pressure and with ROM Left foot 2nd-3rd MTPs swelling and tenderness to pressure and with ROM  Limited MSK ultrasound exam demonstrates synovitis with thickening of capsule and hyperemia, no well organized effusion amenable to aspiration   Investigation: No additional findings.  Imaging: No results found.  Recent Labs: Lab Results  Component Value Date   WBC 8.2 09/27/2022   HGB 14.5 09/27/2022   PLT 289 09/27/2022   NA 138 12/20/2020   K 4.1 12/20/2020   CL 100 12/20/2020  CO2 28 12/20/2020   GLUCOSE 91 12/20/2020   BUN 11 12/20/2020   CREATININE 1.05 12/20/2020   BILITOT 0.4 06/21/2020   ALKPHOS 68 06/21/2020   AST 22 06/21/2020   ALT 24 06/21/2020   PROT 7.6 06/21/2020   ALBUMIN 5.0 12/20/2020   CALCIUM 9.8 12/20/2020   GFRAA 101 04/01/2018    Speciality Comments: No specialty comments available.  Procedures:  No procedures performed Allergies: Patient has no known allergies.   Assessment / Plan:     Visit Diagnoses: Ankle effusion, right - Plan: Sedimentation rate, C-reactive protein, ANA, RNP Antibody, C3 and C4, COMPLETE METABOLIC PANEL WITH GFR  Small ankle effusion there is mostly soft tissue swelling without organized fluid collection amenable to aspiration.  Currently getting partial benefit the meloxicam and the series of prior injections.  Rechecking sed rate and CRP for inflammatory disease  activity monitoring.  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  Will check ANA RNP antibodies and serum complements.  If no specific findings recommend neck step is trial of the addition of sulfasalazine for possible reactive or seronegative inflammatory arthritis.  Possibility of dual-energy CT scan to rule out crystalline arthropathy without a rarely approachable effusion but not chronic enough.  Hypercalcemia  Previous issue may have been associated with the pseudogout several years ago.  Will recheck a metabolic panel today but has been normal consistently for years.  Capsulitis of left foot   Unequivocal active synovitis at the left second and third MTP joints.  High risk medication use  Considering new medication start such as sulfasalazine checking baseline labs including CBC and CMP for medication monitoring.  Discussed general risk for immunomodulatory medication especially this 1 including drug reactions, cytopenias, hepatotoxicity, infection, and long-term malignancy risk.  Orders: Orders Placed This Encounter  Procedures   Sedimentation rate   C-reactive protein   ANA   RNP Antibody   C3 and C4   COMPLETE METABOLIC PANEL WITH GFR   No orders of the defined types were placed in this encounter.    Follow-Up Instructions: Return in about 6 weeks (around 01/06/2023) for New pt ?arthritis SSA trial f/u 6wks.   Fuller Plan, MD  Note - This record has been created using [employer].  Chart creation errors have been sought, but may not always  have been located. Such creation errors do not reflect on  the standard of medical care.

## 2022-11-25 ENCOUNTER — Ambulatory Visit: Payer: BC Managed Care – PPO | Attending: Internal Medicine | Admitting: Internal Medicine

## 2022-11-25 ENCOUNTER — Encounter: Payer: Self-pay | Admitting: Internal Medicine

## 2022-11-25 VITALS — BP 124/88 | HR 97 | Resp 17 | Ht 70.0 in | Wt 215.2 lb

## 2022-11-25 DIAGNOSIS — Z79899 Other long term (current) drug therapy: Secondary | ICD-10-CM

## 2022-11-25 DIAGNOSIS — M25471 Effusion, right ankle: Secondary | ICD-10-CM | POA: Diagnosis not present

## 2022-11-25 DIAGNOSIS — M130 Polyarthritis, unspecified: Secondary | ICD-10-CM | POA: Insufficient documentation

## 2022-11-25 DIAGNOSIS — M778 Other enthesopathies, not elsewhere classified: Secondary | ICD-10-CM

## 2022-11-29 ENCOUNTER — Telehealth: Payer: Self-pay | Admitting: *Deleted

## 2022-11-29 MED ORDER — SULFASALAZINE 500 MG PO TABS
ORAL_TABLET | ORAL | 0 refills | Status: DC
Start: 2022-11-29 — End: 2022-12-23

## 2022-11-29 NOTE — Addendum Note (Signed)
Addended by: Fuller Plan on: 11/29/2022 08:06 AM   Modules accepted: Orders

## 2022-11-29 NOTE — Telephone Encounter (Signed)
See lab note for details.  

## 2022-11-29 NOTE — Telephone Encounter (Signed)
Now addressed and associated result note from his visit this week.  Prescription for sulfasalazine sent to his pharmacy.

## 2022-11-29 NOTE — Telephone Encounter (Signed)
Patient contacted the office and is requesting his lab results. Please review and advise.

## 2022-11-29 NOTE — Progress Notes (Signed)
Sedimentation rate is high at 36 up from 21 2 months ago.  CRP is high at 30.6 down from 43 2 months ago.  Complement C3 is high at 211.  All these are consistent with an ongoing inflammatory response. The ANA and RNP antibody tests are negative so does not suggest this specific kind of post-COVID inflammatory arthritis. His baseline kidney and liver function are fine.  I recommend we try starting the sulfasalazine pills like discussed in clinic.  He could initially start taking 1 tablet twice daily and if he does not have any problem with this after 1 or 2 weeks can increase to 2 tablets twice daily.  Then we will follow-up next month to see how much symptom improvement this gets.  If he has a problem with starting that medication or if symptoms become significantly worse during the interval we could take another look.

## 2022-12-04 NOTE — Progress Notes (Signed)
[name] [address] [place] [address] Phone: [phone] Subjective:   Bruce Donath, am serving as a scribe for Dr. Antoine Primas.  I'm seeing this patient by the request  of:  Shade Flood, MD  CC: right ankle pain follow up   [id number]  10/11/2022 Patient given injection and tolerated the procedure well, discussed icing regimen and home exercises.  Discussed that if this does not seem to make improvement we will consider an intra-articular injection as well in the ankle.  Does appear that the peroneal tendons are significantly better at this point.  Follow-up again in 6 to 8 weeks      Update 12/06/2022 Marrio Kwolek is a 32 y.o. male coming in with complaint of R ankle pain. Pain continues in R ankle. Unable to PF foot with weightbearing. Has seen rheumatology since last visit. Patient states that his L foot is now bothering him. Pain over 2nd metatarsal and into 2nd toe. Swelling present today. Saw [facility] but they did not find anything. After steroid injections he will get spots on tonsils 3 weeks later.     Patient did see rheumatology on July 29.  Reviewing this note they did get labs which did show a sedimentation rate of 36 and a CRP that did come down from 43-30.  Patient was given a recommendation to start sulfasalazine  Once again reviewing patient's MRI of the ankle does have the split tear of the peroneal brevis but unfortunately continues to have posterior tibial tenosynovitis and a moderate tibiotalar joint effusion with synovitis.    Past Medical History:  Diagnosis Date   Allergy    Effusion of knee joint right 07/17/2018   History of hyperlipidemia    Past Surgical History:  Procedure Laterality Date   MENISCUS REPAIR Right    Social History   Socioeconomic History   Marital status: Married    Spouse name: Not on file   Number of children: Not on file   Years of education: Not on file    Highest education level: Bachelor's degree (e.g., BA, AB, BS)  Occupational History   Not on file  Tobacco Use   Smoking status: Never    Passive exposure: Never   Smokeless tobacco: Former    Types: Engineer, drilling   Vaping status: Never Used  Substance and Sexual Activity   Alcohol use: Not Currently   Drug use: Never   Sexual activity: Yes    Birth control/protection: None  Other Topics Concern   Not on file  Social History Narrative   Not on file   Social Determinants of Health   Financial Resource Strain: Low Risk  (09/27/2022)   Overall Financial Resource Strain (CARDIA)    Difficulty of Paying Living Expenses: Not hard at all  Food Insecurity: No Food Insecurity (09/27/2022)   Hunger Vital Sign    Worried About Running Out of Food in the Last Year: Never true    Ran Out of Food in the Last Year: Never true  Transportation Needs: No Transportation Needs (09/27/2022)   PRAPARE - Administrator, Civil Service (Medical): No    Lack of Transportation (Non-Medical): No  Physical Activity: Unknown (09/27/2022)   Exercise Vital Sign    Days of Exercise per Week: Patient declined    Minutes of Exercise per Session: Not on file  Stress: Stress Concern Present (09/27/2022)   [employer] of Occupational Health -  Occupational Stress Questionnaire    Feeling of Stress : To some extent  Social Connections: Socially Integrated (09/27/2022)   Social Connection and Isolation Panel [NHANES]    Frequency of Communication with Friends and Family: More than three times a week    Frequency of Social Gatherings with Friends and Family: Once a week    Attends Religious Services: 1 to 4 times per year    Active Member of [employer] or Organizations: Yes    Attends Engineer, structural: More than 4 times per year    Marital Status: Married   No Known Allergies Family History  Problem Relation Age of Onset   Healthy Mother    Gestational diabetes Mother     Miscarriages / [place] Mother    Healthy Father    Hyperlipidemia Father    Hypertension Father    Kidney disease Father    Gout Father    Gout Brother    Healthy Brother    Healthy Brother    Healthy Son    Healthy Daughter      Current Outpatient Medications (Cardiovascular):    rosuvastatin (CRESTOR) 10 MG tablet, Take 10 mg by mouth daily.  Current Outpatient Medications (Respiratory):    azelastine (ASTELIN) 0.1 % nasal spray,    fexofenadine (ALLEGRA) 180 MG tablet,    fluticasone (FLONASE) 50 MCG/ACT nasal spray,    magic mouthwash (nystatin, lidocaine, diphenhydrAMINE, alum & mag hydroxide) suspension*, Swish and swallow 5 mLs 4 (four) times daily.  Current Outpatient Medications (Analgesics):    Acetaminophen (TYLENOL PO), Take by mouth as needed.   allopurinol (ZYLOPRIM) 100 MG tablet, Take 2 tablets (200 mg total) by mouth daily.   meloxicam (MOBIC) 15 MG tablet, Take 1 tablet (15 mg total) by mouth daily as needed for pain. (Patient taking differently: Take 15 mg by mouth daily.)   Current Outpatient Medications (Other):    Ascorbic Acid (VITAMIN C) 500 MG CHEW,    hydrOXYzine (ATARAX) 10 MG tablet,    magic mouthwash (nystatin, lidocaine, diphenhydrAMINE, alum & mag hydroxide) suspension*, Swish and swallow 5 mLs 4 (four) times daily.   Multiple Vitamin (MULTIVITAMIN ADULT) TABS,    Omega-3 Fatty Acids (FISH OIL BURP-LESS PO), Take by mouth.   sulfaSALAzine (AZULFIDINE) 500 MG tablet, Take 1 tablet (500 mg total) by mouth 2 (two) times daily for 14 days, THEN 2 tablets (1,000 mg total) 2 (two) times daily for 28 days.   venlafaxine XR (EFFEXOR XR) 37.5 MG 24 hr capsule, Take 1 capsule (37.5 mg total) by mouth daily with breakfast.   Vitamin D, Cholecalciferol, 25 MCG (1000 UT) TABS,  * These medications belong to multiple therapeutic classes and are listed under each applicable group.   Reviewed prior external information including notes and imaging from   primary care provider As well as notes that were available from care everywhere and other healthcare systems.  Past medical history, social, surgical and family history all reviewed in electronic medical record.  No pertanent information unless stated regarding to the chief complaint.   Review of Systems:  No headache, visual changes, nausea, vomiting, diarrhea, constipation, dizziness, abdominal pain, skin rash, fevers, chills, night sweats, weight loss, swollen lymph nodes, body aches, joint swelling, chest pain, shortness of breath,. POSITIVE muscle aches and feels like the pain is causing mood changes  Objective  Blood pressure 118/84, pulse 86, height 5\' 10"  (1.778 m), weight 218 lb (98.9 kg), SpO2 98%.   General: No apparent distress alert and  oriented x3 mood and affect normal, dressed appropriately.  HEENT: Pupils equal, extraocular movements intact  Respiratory: Patient's speak in full sentences and does not appear short of breath  Ankle exam does have some still swelling on the right side.  Nontender to palpation.  Patient does have limited range of motion noted. Patient's left foot shows that the second MTP is significantly swollen at this time.  Tender to palpation.  Mild warmth.  No sign of any infectious etiology  Limited muscular skeletal ultrasound was performed and interpreted by Antoine Primas, M  Limited ultrasound of patient's right ankle and left second MTP does show that there is still a synovitis noted with increasing Doppler flow.  No cortical irregularity noted. Impression: Synovitis    Impression and Recommendations:    The above documentation has been reviewed and is accurate and complete Judi Saa, DO

## 2022-12-06 ENCOUNTER — Other Ambulatory Visit: Payer: Self-pay

## 2022-12-06 ENCOUNTER — Encounter: Payer: Self-pay | Admitting: Family Medicine

## 2022-12-06 ENCOUNTER — Ambulatory Visit (INDEPENDENT_AMBULATORY_CARE_PROVIDER_SITE_OTHER): Payer: BC Managed Care – PPO | Admitting: Family Medicine

## 2022-12-06 VITALS — BP 118/84 | HR 86 | Ht 70.0 in | Wt 218.0 lb

## 2022-12-06 DIAGNOSIS — M25571 Pain in right ankle and joints of right foot: Secondary | ICD-10-CM | POA: Diagnosis not present

## 2022-12-06 DIAGNOSIS — M778 Other enthesopathies, not elsewhere classified: Secondary | ICD-10-CM | POA: Diagnosis not present

## 2022-12-06 DIAGNOSIS — M25471 Effusion, right ankle: Secondary | ICD-10-CM

## 2022-12-06 MED ORDER — VENLAFAXINE HCL ER 37.5 MG PO CP24: 37.5000 mg | ORAL_CAPSULE | Freq: Every day | ORAL | 0 refills | Status: DC

## 2022-12-06 MED ORDER — ALLOPURINOL 100 MG PO TABS: 200.0000 mg | ORAL_TABLET | Freq: Every day | ORAL | 0 refills | Status: DC

## 2022-12-06 NOTE — Assessment & Plan Note (Signed)
Continued effusion noted.  Discussed with patient at great length.  I will start Effexor at a low dose and see how patient responds.  Still concern for the potential for the pseudogout and will start allopurinol a week after that.  We discussed titrating up with ibuprofen when he does start the medication.  Encouraged him to continue to follow-up with rheumatology as well and see how the sulfasalazine does work.  If continuing to have trouble other idea would be the potential for a PRP injection but did not know if it would make a significant improvement at this time.  Follow-up with me again 2 to 3 months

## 2022-12-06 NOTE — Assessment & Plan Note (Signed)
Recurrent capsulitis of the second toe.  Has had this problem previously.  Concern with that again.  We discussed with patient I do think it is still secondary to more of the synovitis in the likely underlying systemic problem patient continues around the room.

## 2022-12-06 NOTE — Patient Instructions (Signed)
Effexor 37.5mg  daily After one week start allopurinol 200mg   When you start allopurinol do 3IBU 3x a day for 3 days Give [place] an update in 2 weeks See [place] in 2 months

## 2022-12-10 ENCOUNTER — Ambulatory Visit: Payer: BC Managed Care – PPO | Admitting: Podiatry

## 2022-12-15 IMAGING — DX DG CERVICAL SPINE COMPLETE 4+V
5 series · 5 of 5 positions shown · non-contrast
Comparison: None.

CLINICAL DATA: Right neck pain

EXAM:
CERVICAL SPINE - COMPLETE 4+ VIEW

[c-spine lat]
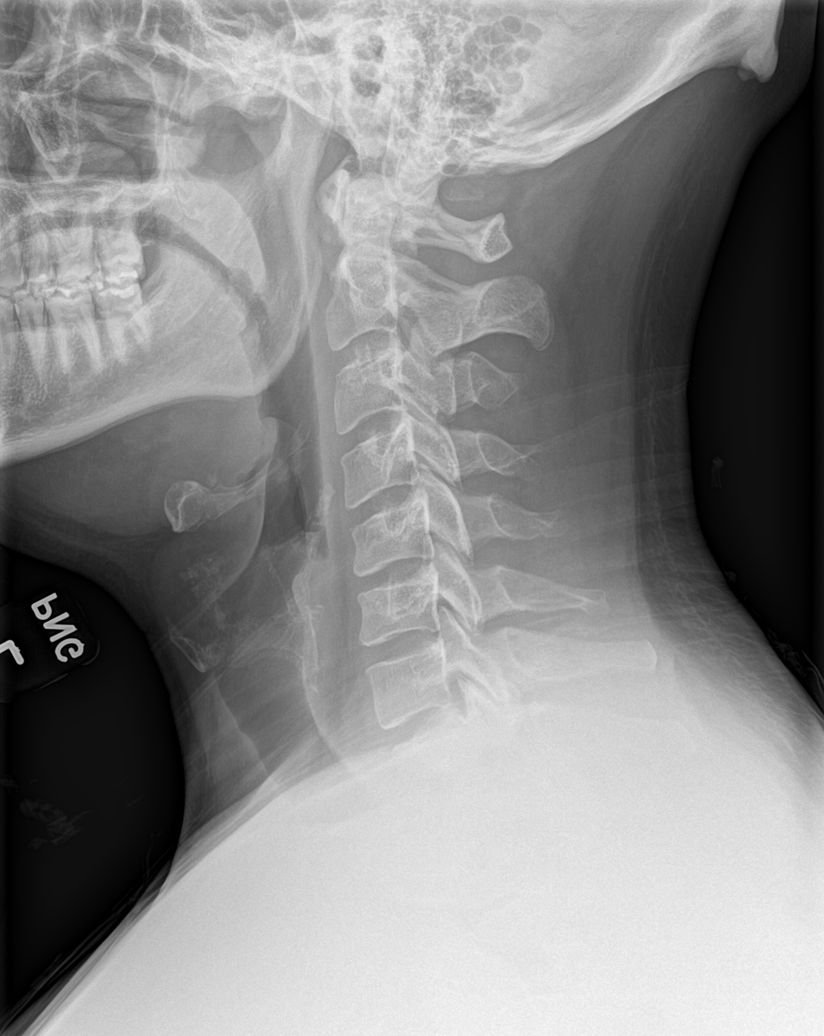

[c-spine obl (1 of 2)]
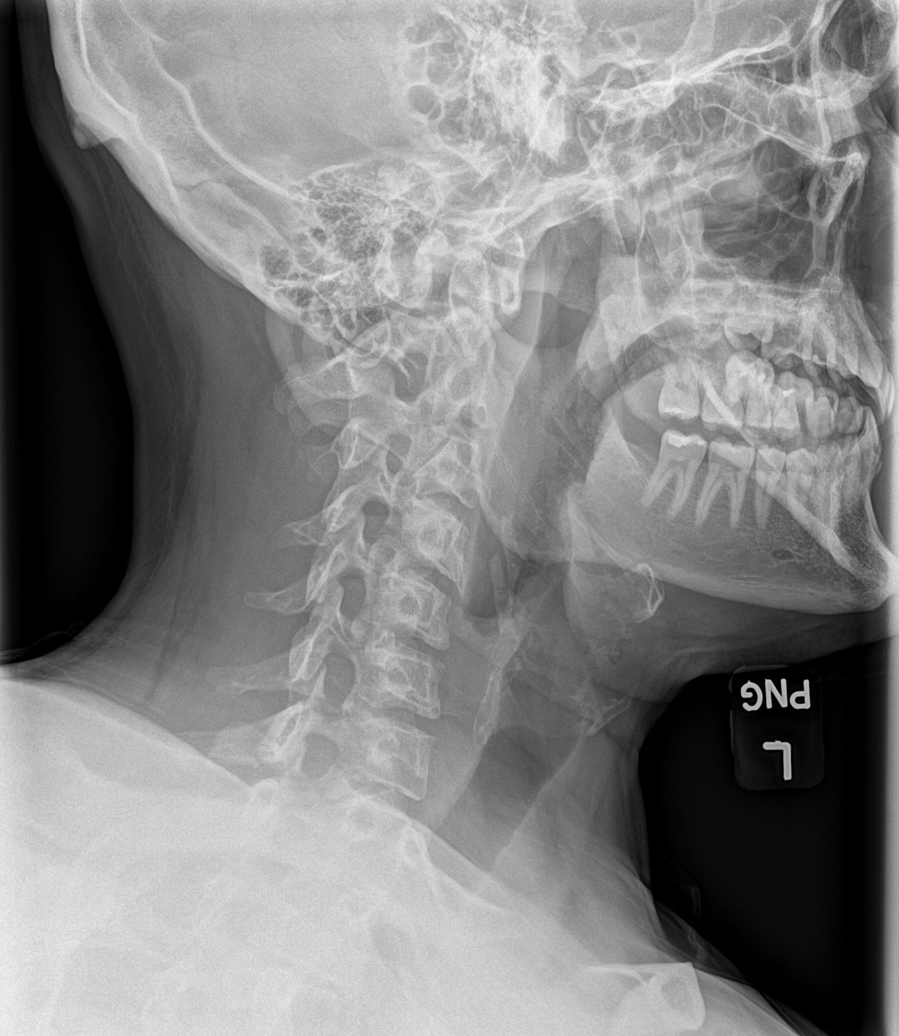

[c-spine obl (2 of 2)]
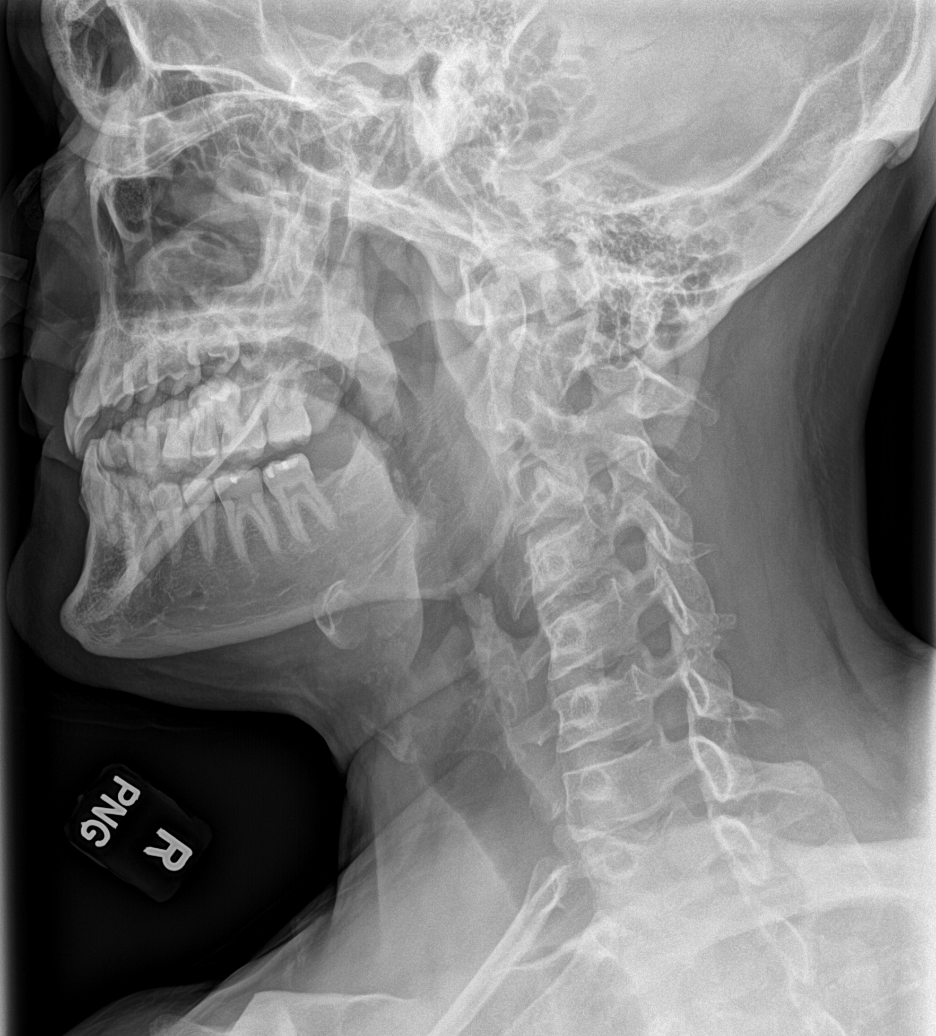

[c-spine ap]
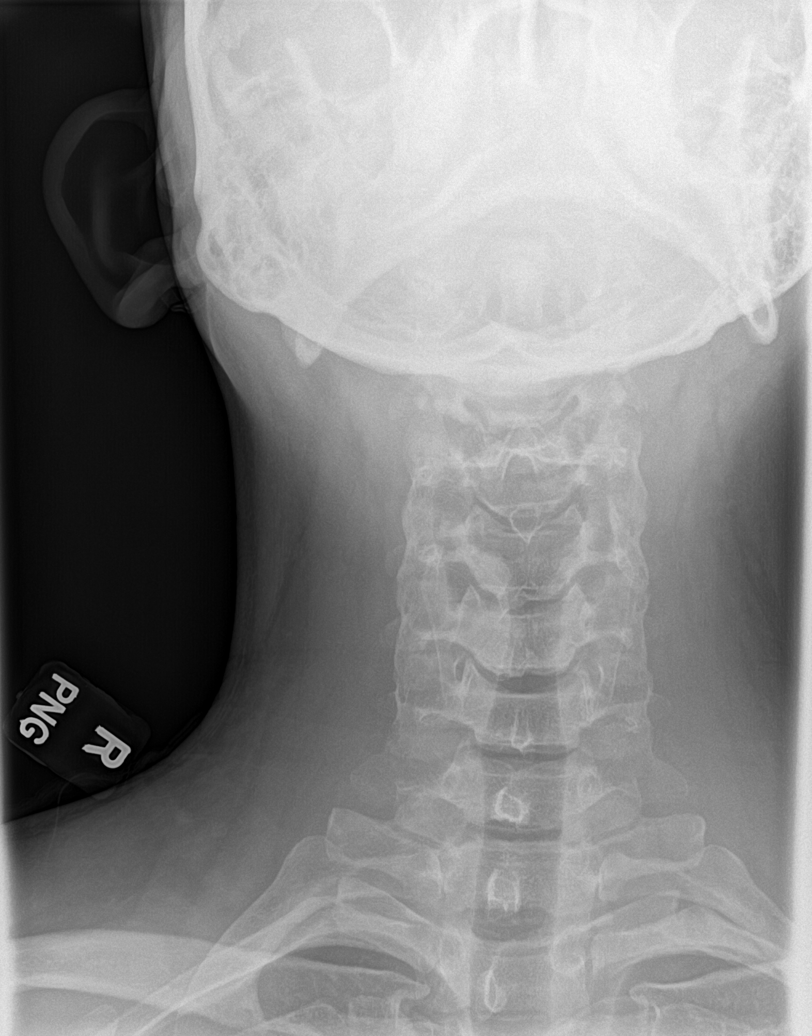

[c-spine open mouth]
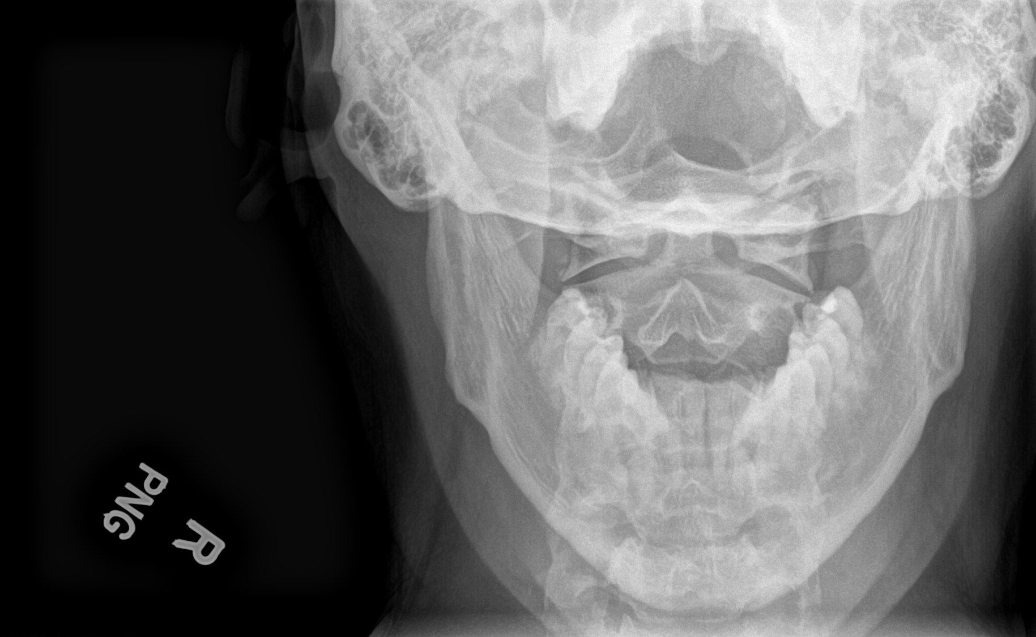

[5 of 5 positions shown; findings below may reference images not displayed]

FINDINGS: Normal alignment. Disc spaces maintained. No fracture. Prevertebral
soft tissues are normal.
IMPRESSION: Negative cervical spine radiographs.

## 2022-12-17 ENCOUNTER — Other Ambulatory Visit: Payer: Self-pay | Admitting: Family Medicine

## 2022-12-21 ENCOUNTER — Other Ambulatory Visit: Payer: Self-pay | Admitting: Internal Medicine

## 2022-12-21 DIAGNOSIS — M25471 Effusion, right ankle: Secondary | ICD-10-CM

## 2022-12-21 DIAGNOSIS — M778 Other enthesopathies, not elsewhere classified: Secondary | ICD-10-CM

## 2022-12-23 ENCOUNTER — Encounter: Payer: Self-pay | Admitting: Family Medicine

## 2022-12-23 NOTE — Telephone Encounter (Signed)
Contacted the patient and the patient states he is currently taking the two tablets twice a day. Patient states the pharmacy messed up and did not give him all of the pills but that he has enough to last him until this weekend and he wont have any for about a week before his next appointment. Patient states he has not noticed any side effects. Patient states the medication has not made him feel any worse but he does not feel any better either. Patient is unsure of what to do. Advised the patient I would let Dr. Dimple Casey know.

## 2022-12-23 NOTE — Telephone Encounter (Signed)
Attempted to contact the patient to inquire how the patient is doing on the sulfasalazine two pills twice a day so that I can work up a correct prescription. Left a message for the patient to call the office back.

## 2022-12-25 MED ORDER — VENLAFAXINE HCL ER 75 MG PO CP24: 75.0000 mg | ORAL_CAPSULE | Freq: Every day | ORAL | 1 refills | Status: DC

## 2022-12-28 ENCOUNTER — Other Ambulatory Visit: Payer: Self-pay | Admitting: Family Medicine

## 2023-01-06 NOTE — Progress Notes (Signed)
Office Visit Note  Patient: Marco Reynolds             Date of Birth: [birth year]           MRN: [id number]             PCP: Shade Flood, MD Referring: Shade Flood, MD Visit Date: 01/07/2023   Subjective:  Follow-up (Patient states he is still having problems with his right ankle and left foot. )   History of Present Illness: Marco Reynolds is a 32 y.o. male here for follow up for right ankle pain and left toe pain with persistent joint swelling after trial of starting sulfasalazine 1000 mg twice daily.  Lab test at initial visit negative for any serologic marker antibody markers and negative for HLA-B27 but showing persistent elevated CRP and sedimentation rate.  He started the sulfasalazine 500 mg twice daily for 1 week then 1000 mg twice daily now at about 1 month duration so far has not noticed any appreciable change in symptoms.  He followed up with Dr. Katrinka Blazing started on allopurinol which he is tolerating.  Was also started on venlafaxine initially 37.5 mg and increase to 75 mg daily.  Despite all this still has symptoms pain is more with use and weightbearing versus direct pressure on the affected areas.  Sometimes gets a catch sensation in the right ankle that extends onto the medial side and bottom of the arch.  Persistent swelling at the left second toe is now worse at the base of the toe and less distally.  Previous HPI 11/25/22  Marco Reynolds is a 32 y.o. male here for evaluation of joint pain and swelling mostly in the right ankle but few other joints also involved.  This started since March 16 he woke up with acute onsets of significant pain and swelling.  Tried taking oral anti-inflammatory medications with limited benefit.  Saw Dr. Katrinka Blazing had imaging trial of several steroid injections for concern of ankle inflammation or tendinopathy.  Went for MRI of the ankle in April demonstrated some chronic tendon changes also tibiotalar joint effusion.  In total  to lateral and 1 medial steroid injection these were somewhat helpful and lateral ankle pain did improve but incomplete benefit.  During this time was also treated with a round of medication for bronchitis that resolved but did not see relief in joint pain with resolution.  More recently also started to have visible swelling and pain at the base of second and third toes on the left foot and a feeling like a nodule or mass pressing on the bottom of the foot while weightbearing.  Currently taking meloxicam 15 mg daily instead of over-the-counter NSAIDs which is partially controlling symptoms but still having a lot of activity and mobility limitations. Prior to onset of the symptoms he did not recall any new injury or change in activity.  He was sick with uncomplicated course of COVID in January. He had a previous history of right knee and left ankle pain and effusions in 2020 found to have pseudogout confirmed on knee aspirate with intracellular calcium pyrophosphate crystals.  Also around that time did have 1 small kidney stone.  Also with hypercalcemia and hyperparathyroidism these are thought incidental to hide baseline serum protein level and mild vitamin D insufficiency that resolved.   Review of Systems  Constitutional:  Negative for fatigue.  HENT:  Negative for mouth sores and mouth dryness.   Eyes:  Negative for dryness.  Respiratory:  Negative for shortness of breath.   Cardiovascular:  Negative for chest pain and palpitations.  Gastrointestinal:  Negative for blood in stool, constipation and diarrhea.  Endocrine: Negative for increased urination.  Genitourinary:  Negative for involuntary urination.  Musculoskeletal:  Positive for joint pain, joint pain, joint swelling and morning stiffness. Negative for gait problem, myalgias, muscle weakness, muscle tenderness and myalgias.  Skin:  Positive for color change. Negative for rash, hair loss and sensitivity to sunlight.  Allergic/Immunologic:  Negative for susceptible to infections.  Neurological:  Negative for dizziness and headaches.  Hematological:  Negative for swollen glands.  Psychiatric/Behavioral:  Negative for depressed mood and sleep disturbance. The patient is not nervous/anxious.     PMFS History:  Patient Active Problem List   Diagnosis Date Noted   History of pseudogout 01/07/2023   Capsulitis of left foot 11/25/2022   High risk medication use 11/25/2022   Tenosynovitis of tibialis posterior tendon 10/11/2022   Peroneal tendon tear, right, initial encounter 08/30/2022   Ankle effusion, right 07/31/2022   Hypercalcemia 02/04/2020   Epididymitis, left 01/28/2020   Left ankle pain 07/17/2018   Obesity (BMI 30.0-34.9) 04/01/2018   Deviated nasal septum 07/21/2017   Nasal turbinate hypertrophy 07/21/2017   Seasonal allergic rhinitis due to pollen 12/16/2016    Past Medical History:  Diagnosis Date   Allergy    Effusion of knee joint right 07/17/2018   History of hyperlipidemia     Family History  Problem Relation Age of Onset   Healthy Mother    Gestational diabetes Mother    Miscarriages / [place] Mother    Healthy Father    Hyperlipidemia Father    Hypertension Father    Kidney disease Father    Gout Father    Gout Brother    Healthy Brother    Healthy Brother    Healthy Son    Healthy Daughter    Past Surgical History:  Procedure Laterality Date   MENISCUS REPAIR Right    Social History   Social History Narrative   Not on file   Immunization History  Administered Date(s) Administered   DTaP 03/13/1989, 05/13/1989, 07/08/1992, 07/23/1992, 03/11/1994   HIB (PRP-OMP) 03/13/1989, 05/13/1989, 07/08/1989, 05/07/1990   Hepatitis A, Adult 09/09/2017   Hepatitis B 01/29/2001, 03/05/2001, 07/16/2001   Hepatitis B, ADULT 09/18/2017, 11/04/2017, 01/08/2018   IPV 03/13/1989, 05/13/1989, 07/23/1989, 03/11/1994   Influenza Inj Mdck Quad Pf 02/11/2022   Influenza,inj,Quad PF,6+ Mos 02/13/2017,  02/09/2019   Influenza-Unspecified 02/06/2016, 01/27/2018, 02/07/2020, 02/11/2022   MMR 05/07/1990, 03/11/1994   Meningococcal Mcv4o 09/09/2017   Moderna Sars-Covid-2 Vaccination 07/01/2019, 08/03/2019, 03/02/2020   Tdap 10/24/2006, 07/14/2014     Objective: Vital Signs: BP (!) 151/102 (BP Location: Left Arm, Patient Position: Sitting, Cuff Size: Normal)   Pulse 97   Resp 14   Ht 5\' 10"  (1.778 m)   Wt 221 lb (100.2 kg)   BMI 31.71 kg/m    Physical Exam Eyes:     Conjunctiva/sclera: Conjunctivae normal.  Cardiovascular:     Rate and Rhythm: Normal rate and regular rhythm.  Pulmonary:     Effort: Pulmonary effort is normal.     Breath sounds: Normal breath sounds.  Musculoskeletal:     Right lower leg: No edema.     Left lower leg: No edema.  Skin:    General: Skin is warm and dry.     Findings: No rash.  Neurological:     Mental Status: He is alert.  Psychiatric:  Mood and Affect: Mood normal.      Musculoskeletal Exam:  Wrists full ROM no tenderness or swelling Fingers full ROM no tenderness or swelling Knees full ROM no tenderness or swelling Right ankle with mild tenderness to pressure along medial border posterior inferior side on the medial malleolus, no palpable swelling or nodules Left second toe with joint effusion with faint erythema at the second MTP mildly tender more pain provoked with flexion and extension range of motion, dactylitis swelling without tenderness distally   Investigation: No additional findings.  Imaging: No results found.  Recent Labs: Lab Results  Component Value Date   WBC 5.4 01/07/2023   HGB 14.6 01/07/2023   PLT 304 01/07/2023   NA 139 01/07/2023   K 4.4 01/07/2023   CL 99 01/07/2023   CO2 30 01/07/2023   GLUCOSE 94 01/07/2023   BUN 13 01/07/2023   CREATININE 0.94 01/07/2023   BILITOT 0.4 01/07/2023   ALKPHOS 68 06/21/2020   AST 16 01/07/2023   ALT 16 01/07/2023   PROT 8.0 01/07/2023   ALBUMIN 5.0 12/20/2020    CALCIUM 10.1 01/07/2023   GFRAA 101 04/01/2018    Speciality Comments: No specialty comments available.  Procedures:  No procedures performed Allergies: Patient has no known allergies.   Assessment / Plan:     Visit Diagnoses: Capsulitis of left foot - Plan: Sedimentation rate, C-reactive protein  Continue to unquestionable inflammation at the left second toe with MTP synovitis and associated dactylitis.  Not clear so far if the addition of sulfasalazine has changed anything regarding amount of swelling.  Recheck sed rate and CRP for inflammatory activity monitoring.  If these has trended down or normalized I would recommend continuing the medicine for longer as it can take a few months to see maximum clinical response.  High risk medication use - Plan: CBC with Differential/Platelet, COMPLETE METABOLIC PANEL WITH GFR  Checking CBC and CMP for medication monitoring after starting sulfasalazine.  Overall tolerating the medication fine so far.  No serious interval infections.  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  Given chronicity of symptoms could also be candidate for trial of dual-energy CT scan if we are still not seeing better symptom resolution and still no more large effusions amenable for aspiration.  Orders: Orders Placed This Encounter  Procedures   Sedimentation rate   CBC with Differential/Platelet   COMPLETE METABOLIC PANEL WITH GFR   C-reactive protein   No orders of the defined types were placed in this encounter.    Follow-Up Instructions: Return in about 2 months (around 03/09/2023) for Inflammatory arthritis f/u 2mos.   Fuller Plan, MD  Note - This record has been created using [employer].  Chart creation errors have been sought, but may not always  have been located. Such creation errors do not reflect on  the  standard of medical care.

## 2023-01-07 ENCOUNTER — Ambulatory Visit: Payer: BC Managed Care – PPO | Attending: Internal Medicine | Admitting: Internal Medicine

## 2023-01-07 ENCOUNTER — Encounter: Payer: Self-pay | Admitting: Internal Medicine

## 2023-01-07 VITALS — BP 151/102 | HR 97 | Resp 14 | Ht 70.0 in | Wt 221.0 lb

## 2023-01-07 DIAGNOSIS — Z8739 Personal history of other diseases of the musculoskeletal system and connective tissue: Secondary | ICD-10-CM | POA: Insufficient documentation

## 2023-01-07 DIAGNOSIS — Z79899 Other long term (current) drug therapy: Secondary | ICD-10-CM

## 2023-01-07 DIAGNOSIS — M778 Other enthesopathies, not elsewhere classified: Secondary | ICD-10-CM

## 2023-01-07 DIAGNOSIS — S86311A Strain of muscle(s) and tendon(s) of peroneal muscle group at lower leg level, right leg, initial encounter: Secondary | ICD-10-CM | POA: Diagnosis not present

## 2023-01-08 LAB — SEDIMENTATION RATE: Sed Rate: 11 mm/h (ref 0–15)

## 2023-01-08 LAB — COMPLETE METABOLIC PANEL WITH GFR
AG Ratio: 1.8 (calc) (ref 1.0–2.5)
ALT: 16 U/L (ref 9–46)
AST: 16 U/L (ref 10–40)
Albumin: 5.1 g/dL (ref 3.6–5.1)
Alkaline phosphatase (APISO): 83 U/L (ref 36–130)
BUN: 13 mg/dL (ref 7–25)
CO2: 30 mmol/L (ref 20–32)
Calcium: 10.1 mg/dL (ref 8.6–10.3)
Chloride: 99 mmol/L (ref 98–110)
Creat: 0.94 mg/dL (ref 0.60–1.26)
Globulin: 2.9 g/dL (ref 1.9–3.7)
Glucose, Bld: 94 mg/dL (ref 65–99)
Potassium: 4.4 mmol/L (ref 3.5–5.3)
Sodium: 139 mmol/L (ref 135–146)
Total Bilirubin: 0.4 mg/dL (ref 0.2–1.2)
Total Protein: 8 g/dL (ref 6.1–8.1)
eGFR: 109 mL/min/{1.73_m2} (ref >=60)

## 2023-01-08 LAB — C-REACTIVE PROTEIN: CRP: <3 mg/L (ref <8.0)

## 2023-01-08 LAB — CBC WITH DIFFERENTIAL/PLATELET
Absolute Monocytes: 243 {cells}/uL (ref 200–950)
Basophils Absolute: 22 {cells}/uL (ref 0–200)
Basophils Relative: 0.4 %
Eosinophils Absolute: 11 {cells}/uL — ABNORMAL LOW (ref 15–500)
Eosinophils Relative: 0.2 %
HCT: 44.6 % (ref 38.5–50.0)
Hemoglobin: 14.6 g/dL (ref 13.2–17.1)
Lymphs Abs: 1841 {cells}/uL (ref 850–3900)
MCH: 27.8 pg (ref 27.0–33.0)
MCHC: 32.7 g/dL (ref 32.0–36.0)
MCV: 85 fL (ref 80.0–100.0)
MPV: 10.5 fL (ref 7.5–12.5)
Monocytes Relative: 4.5 %
Neutro Abs: 3283 {cells}/uL (ref 1500–7800)
Neutrophils Relative %: 60.8 %
Platelets: 304 10*3/uL (ref 140–400)
RBC: 5.25 10*6/uL (ref 4.20–5.80)
RDW: 12.6 % (ref 11.0–15.0)
Total Lymphocyte: 34.1 %
WBC: 5.4 10*3/uL (ref 3.8–10.8)

## 2023-01-12 ENCOUNTER — Encounter: Payer: Self-pay | Admitting: Family Medicine

## 2023-01-13 ENCOUNTER — Encounter: Payer: Self-pay | Admitting: Internal Medicine

## 2023-01-25 ENCOUNTER — Other Ambulatory Visit: Payer: Self-pay | Admitting: Internal Medicine

## 2023-01-25 DIAGNOSIS — M778 Other enthesopathies, not elsewhere classified: Secondary | ICD-10-CM

## 2023-01-25 DIAGNOSIS — M25471 Effusion, right ankle: Secondary | ICD-10-CM

## 2023-01-27 NOTE — Telephone Encounter (Signed)
Attempted to contact the patient to clarify what dose he is on. Left a message to call the office back.

## 2023-01-27 NOTE — Telephone Encounter (Signed)
Patient states he is now taking sulfasalazine 500 MG 2 tablets twice daily. Prescription changed to reflect 2 tablets twice daily instead of a taper up. Please review and correct prescription if needed.   Last Fill: 12/23/2022  Labs: 01/07/2023 Eosinophils Absolute 11  Next Visit: 03/10/2023  Last Visit: 01/07/2023  DX: Capsulitis of left foot   Current Dose per office note 01/07/2023: not mentioned  Okay to refill Sulfasalazine?

## 2023-01-30 NOTE — Progress Notes (Signed)
Marco Reynolds [address] [place] [address] Phone: [phone] Subjective:   Marco Reynolds, am serving as a scribe for Dr. Antoine Reynolds.  I'm seeing this patient by the request  of:  Shade Flood, MD  CC: Right ankle and left foot pain follow-up  [id number]  12/06/2022 Recurrent capsulitis of the second toe.  Has had this problem previously.  Concern with that again.  We discussed with patient I do think it is still secondary to more of the synovitis in the likely underlying systemic problem patient continues around the room.   Continued effusion noted.  Discussed with patient at great length.  I will start Effexor at a low dose and see how patient responds.  Still concern for the potential for the pseudogout and will start allopurinol a week after that.  We discussed titrating up with ibuprofen when he does start the medication.  Encouraged him to continue to follow-up with rheumatology as well and see how the sulfasalazine does work.  If continuing to have trouble other idea would be the potential for a PRP injection but did not know if it would make a significant improvement at this time.  Follow-up with me again 2 to 3 months      Update 01/31/2023 Marco Reynolds is a 32 y.o. male coming in with complaint of R foot swelling and L foot toe pain. Patient states R ankle and L foot doing a bit better. Has done a little acupuncture. Foot about the same. Still taking medications. Stopped Allopurinol.  Feels like it did not make any significant difference.  Reviewing patient's chart has seen rheumatology who has not made any significant    Past Medical History:  Diagnosis Date   Allergy    Effusion of knee joint right 07/17/2018   History of hyperlipidemia    Past Surgical History:  Procedure Laterality Date   MENISCUS REPAIR Right    Social History   Socioeconomic History   Marital status: Married    Spouse name: Not on file    Number of children: Not on file   Years of education: Not on file   Highest education level: Bachelor's degree (e.g., BA, AB, BS)  Occupational History   Not on file  Tobacco Use   Smoking status: Never    Passive exposure: Never   Smokeless tobacco: Former    Types: Engineer, drilling   Vaping status: Never Used  Substance and Sexual Activity   Alcohol use: Not Currently   Drug use: Never   Sexual activity: Yes    Birth control/protection: None  Other Topics Concern   Not on file  Social History Narrative   Not on file   Social Determinants of Health   Financial Resource Strain: Low Risk  (09/27/2022)   Overall Financial Resource Strain (CARDIA)    Difficulty of Paying Living Expenses: Not hard at all  Food Insecurity: No Food Insecurity (09/27/2022)   Hunger Vital Sign    Worried About Running Out of Food in the Last Year: Never true    Ran Out of Food in the Last Year: Never true  Transportation Needs: No Transportation Needs (09/27/2022)   PRAPARE - Administrator, Civil Service (Medical): No    Lack of Transportation (Non-Medical): No  Physical Activity: Unknown (09/27/2022)   Exercise Vital Sign    Days of Exercise per Week: Patient declined    Minutes of Exercise per Session: Not  on file  Stress: Stress Concern Present (09/27/2022)   [employer] of Occupational Health - Occupational Stress Questionnaire    Feeling of Stress : To some extent  Social Connections: Socially Integrated (09/27/2022)   Social Connection and Isolation Panel [NHANES]    Frequency of Communication with Friends and Family: More than three times a week    Frequency of Social Gatherings with Friends and Family: Once a week    Attends Religious Services: 1 to 4 times per year    Active Member of [employer] or Organizations: Yes    Attends Engineer, structural: More than 4 times per year    Marital Status: Married   No Known Allergies Family History  Problem Relation Age of  Onset   Healthy Mother    Gestational diabetes Mother    Miscarriages / [place] Mother    Healthy Father    Hyperlipidemia Father    Hypertension Father    Kidney disease Father    Gout Father    Gout Brother    Healthy Brother    Healthy Brother    Healthy Son    Healthy Daughter      Current Outpatient Medications (Cardiovascular):    rosuvastatin (CRESTOR) 10 MG tablet, Take 10 mg by mouth daily.  Current Outpatient Medications (Respiratory):    azelastine (ASTELIN) 0.1 % nasal spray,    fexofenadine (ALLEGRA) 180 MG tablet,    fluticasone (FLONASE) 50 MCG/ACT nasal spray,    magic mouthwash (nystatin, lidocaine, diphenhydrAMINE, alum & mag hydroxide) suspension*, Swish and swallow 5 mLs 4 (four) times daily. (Patient not taking: Reported on 01/07/2023)  Current Outpatient Medications (Analgesics):    Acetaminophen (TYLENOL PO), Take by mouth as needed.   allopurinol (ZYLOPRIM) 100 MG tablet, TAKE 2 TABLETS BY MOUTH EVERY DAY   meloxicam (MOBIC) 15 MG tablet, Take 1 tablet (15 mg total) by mouth daily as needed for pain. (Patient not taking: Reported on 01/07/2023)   Current Outpatient Medications (Other):    Ascorbic Acid (VITAMIN C) 500 MG CHEW,    hydrOXYzine (ATARAX) 10 MG tablet,    magic mouthwash (nystatin, lidocaine, diphenhydrAMINE, alum & mag hydroxide) suspension*, Swish and swallow 5 mLs 4 (four) times daily. (Patient not taking: Reported on 01/07/2023)   Multiple Vitamin (MULTIVITAMIN ADULT) TABS,    Omega-3 Fatty Acids (FISH OIL BURP-LESS PO), Take by mouth. (Patient not taking: Reported on 01/07/2023)   sulfaSALAzine (AZULFIDINE) 500 MG tablet, Take 2 tablets (1,000 mg total) by mouth 2 (two) times daily.   venlafaxine XR (EFFEXOR XR) 75 MG 24 hr capsule, Take 1 capsule (75 mg total) by mouth daily with breakfast.   Vitamin D, Cholecalciferol, 25 MCG (1000 UT) TABS,  * These medications belong to multiple therapeutic classes and are listed under each  applicable group.   Reviewed prior external information including notes and imaging from  primary care provider As well as notes that were available from care everywhere and other healthcare systems.  Past medical history, social, surgical and family history all reviewed in electronic medical record.  No pertanent information unless stated regarding to the chief complaint.   Review of Systems:  No headache, visual changes, nausea, vomiting, diarrhea, constipation, dizziness, abdominal pain, skin rash, fevers, chills, night sweats, weight loss, swollen lymph nodes, body aches, joint swelling, chest pain, shortness of breath, mood changes. POSITIVE muscle aches  Objective  Blood pressure 124/80, pulse 87, height 5\' 10"  (1.778 m), weight 229 lb (103.9 kg), SpO2 96%.  General: No apparent distress alert and oriented x3 mood and affect normal, dressed appropriately.  HEENT: Pupils equal, extraocular movements intact  Respiratory: Patient's speak in full sentences and does not appear short of breath  Cardiovascular: No lower extremity edema, non tender, no erythema  Right ankle significant decrease in swelling noted.  Still has some limited dorsiflexion of the ankle compared to the contralateral side.  Does have hallux limitus noted left toe.  Limited muscular skeletal ultrasound was performed and interpreted by Marco Reynolds, M  Limited ultrasound of patient's ankle does show hypoechoic changes of the ankle mortise still noted but significantly improved from previous exam.  Peroneal tendon also shows significant improvement. Impression: Interval improvement.    Impression and Recommendations:    The above documentation has been reviewed and is accurate and complete Judi Saa, DO

## 2023-01-31 ENCOUNTER — Encounter: Payer: Self-pay | Admitting: Family Medicine

## 2023-01-31 ENCOUNTER — Ambulatory Visit (INDEPENDENT_AMBULATORY_CARE_PROVIDER_SITE_OTHER): Payer: BC Managed Care – PPO | Admitting: Family Medicine

## 2023-01-31 ENCOUNTER — Other Ambulatory Visit: Payer: Self-pay

## 2023-01-31 VITALS — BP 124/80 | HR 87 | Ht 70.0 in | Wt 229.0 lb

## 2023-01-31 DIAGNOSIS — M25471 Effusion, right ankle: Secondary | ICD-10-CM

## 2023-01-31 DIAGNOSIS — M778 Other enthesopathies, not elsewhere classified: Secondary | ICD-10-CM | POA: Diagnosis not present

## 2023-01-31 DIAGNOSIS — S86311A Strain of muscle(s) and tendon(s) of peroneal muscle group at lower leg level, right leg, initial encounter: Secondary | ICD-10-CM | POA: Diagnosis not present

## 2023-01-31 NOTE — Patient Instructions (Addendum)
[place] Hold on PRP cause everything is working No large changes See me when you need me

## 2023-01-31 NOTE — Assessment & Plan Note (Signed)
Ankle effusion is significantly better.  Has had the peroneal tendon tear but even that seems to be healing at this time.  Seems to have made great strides at the moment.  Discussed icing regimen and home exercises otherwise.  Increase activity slowly otherwise.  Follow-up with me as needed as long as patient continues to make improvement.

## 2023-01-31 NOTE — Assessment & Plan Note (Signed)
Improvement noted but still has some hallux limitus noted.  No change in the rigid soled shoes if needed.

## 2023-02-24 ENCOUNTER — Ambulatory Visit (INDEPENDENT_AMBULATORY_CARE_PROVIDER_SITE_OTHER): Payer: BC Managed Care – PPO | Admitting: Podiatry

## 2023-02-24 ENCOUNTER — Encounter: Payer: Self-pay | Admitting: Podiatry

## 2023-02-24 VITALS — BP 169/100 | HR 102

## 2023-02-24 DIAGNOSIS — M7752 Other enthesopathy of left foot: Secondary | ICD-10-CM

## 2023-02-24 NOTE — Progress Notes (Unsigned)
Office Visit Note  Patient: Marco Reynolds             Date of Birth: 1991/03/24           MRN: [id number]             PCP: Shade Flood, MD Referring: Shade Flood, MD Visit Date: 03/10/2023   Subjective:  Follow-up (Patient states with the new medication he is having fatigue and a lot of headaches. )   History of Present Illness: Marco Reynolds is a 32 y.o. male here for follow up for right ankle pain and left toe pain with persistent joint swelling now just starting methotrexate 15 mg PO weekly and folic acid 1 mg daily.  ***  Previous HPI 01/07/2023 Marco Reynolds is a 32 y.o. male here for follow up for right ankle pain and left toe pain with persistent joint swelling after trial of starting sulfasalazine 1000 mg twice daily.  Lab test at initial visit negative for any serologic marker antibody markers and negative for HLA-B27 but showing persistent elevated CRP and sedimentation rate.  He started the sulfasalazine 500 mg twice daily for 1 week then 1000 mg twice daily now at about 1 month duration so far has not noticed any appreciable change in symptoms.  He followed up with Dr. Katrinka Blazing started on allopurinol which he is tolerating.  Was also started on venlafaxine initially 37.5 mg and increase to 75 mg daily.  Despite all this still has symptoms pain is more with use and weightbearing versus direct pressure on the affected areas.  Sometimes gets a catch sensation in the right ankle that extends onto the medial side and bottom of the arch.  Persistent swelling at the left second toe is now worse at the base of the toe and less distally.   Previous HPI 11/25/22  Marco Reynolds is a 32 y.o. male here for evaluation of joint pain and swelling mostly in the right ankle but few other joints also involved.  This started since March 16 he woke up with acute onsets of significant pain and swelling.  Tried taking oral anti-inflammatory medications with  limited benefit.  Saw Dr. Katrinka Blazing had imaging trial of several steroid injections for concern of ankle inflammation or tendinopathy.  Went for MRI of the ankle in April demonstrated some chronic tendon changes also tibiotalar joint effusion.  In total to lateral and 1 medial steroid injection these were somewhat helpful and lateral ankle pain did improve but incomplete benefit.  During this time was also treated with a round of medication for bronchitis that resolved but did not see relief in joint pain with resolution.  More recently also started to have visible swelling and pain at the base of second and third toes on the left foot and a feeling like a nodule or mass pressing on the bottom of the foot while weightbearing.  Currently taking meloxicam 15 mg daily instead of over-the-counter NSAIDs which is partially controlling symptoms but still having a lot of activity and mobility limitations. Prior to onset of the symptoms he did not recall any new injury or change in activity.  He was sick with uncomplicated course of COVID in January. He had a previous history of right knee and left ankle pain and effusions in 2020 found to have pseudogout confirmed on knee aspirate with intracellular calcium pyrophosphate crystals.  Also around that time did have 1 small kidney stone.  Also with hypercalcemia and hyperparathyroidism  these are thought incidental to hide baseline serum protein level and mild vitamin D insufficiency that resolved.   Review of Systems  Constitutional:  Positive for fatigue.  HENT:  Negative for mouth sores and mouth dryness.   Eyes:  Negative for dryness.  Respiratory:  Negative for shortness of breath.   Cardiovascular:  Negative for chest pain and palpitations.  Gastrointestinal:  Negative for blood in stool, constipation and diarrhea.  Endocrine: Negative for increased urination.  Genitourinary:  Negative for involuntary urination.  Musculoskeletal:  Positive for joint pain, joint  pain, joint swelling, myalgias, morning stiffness and myalgias. Negative for gait problem, muscle weakness and muscle tenderness.  Skin:  Negative for color change, rash, hair loss and sensitivity to sunlight.  Allergic/Immunologic: Negative for susceptible to infections.  Neurological:  Positive for headaches. Negative for dizziness.  Hematological:  Negative for swollen glands.  Psychiatric/Behavioral:  Negative for depressed mood and sleep disturbance. The patient is not nervous/anxious.     PMFS History:  Patient Active Problem List   Diagnosis Date Noted   Polyarthralgia, inflammatory arthritis 03/04/2023   History of pseudogout 01/07/2023   Capsulitis of left foot 11/25/2022   High risk medication use 11/25/2022   Tenosynovitis of tibialis posterior tendon 10/11/2022   Peroneal tendon tear, right, initial encounter 08/30/2022   Ankle effusion, right 07/31/2022   Hypercalcemia 02/04/2020   Epididymitis, left 01/28/2020   Left ankle pain 07/17/2018   Obesity (BMI 30.0-34.9) 04/01/2018   Deviated nasal septum 07/21/2017   Nasal turbinate hypertrophy 07/21/2017   Seasonal allergic rhinitis due to pollen 12/16/2016    Past Medical History:  Diagnosis Date   Allergy    Effusion of knee joint right 07/17/2018   History of hyperlipidemia     Family History  Problem Relation Age of Onset   Healthy Mother    Gestational diabetes Mother    Miscarriages / [place] Mother    Healthy Father    Hyperlipidemia Father    Hypertension Father    Kidney disease Father    Gout Father    Gout Brother    Healthy Brother    Healthy Brother    Healthy Son    Healthy Daughter    Past Surgical History:  Procedure Laterality Date   MENISCUS REPAIR Right    Social History   Social History Narrative   Not on file   Immunization History  Administered Date(s) Administered   DTaP 03/13/1989, 05/13/1989, 07/08/1992, 07/23/1992, 03/11/1994   HIB (PRP-OMP) 03/13/1989, 05/13/1989,  07/08/1989, 05/07/1990   Hepatitis A, Adult 09/09/2017   Hepatitis B 01/29/2001, 03/05/2001, 07/16/2001   Hepatitis B, ADULT 09/18/2017, 11/04/2017, 01/08/2018   IPV 03/13/1989, 05/13/1989, 07/23/1989, 03/11/1994   Influenza Inj Mdck Quad Pf 02/11/2022   Influenza,inj,Quad PF,6+ Mos 02/13/2017, 02/09/2019   Influenza-Unspecified 02/06/2016, 01/27/2018, 02/07/2020, 02/11/2022   MMR 05/07/1990, 03/11/1994   Meningococcal Mcv4o 09/09/2017   Moderna Sars-Covid-2 Vaccination 07/01/2019, 08/03/2019, 03/02/2020   Tdap 10/24/2006, 07/14/2014     Objective: Vital Signs: BP (!) 156/99 (BP Location: Left Arm, Patient Position: Sitting, Cuff Size: Normal)   Pulse (!) 111   Resp 14   Ht 5\' 10"  (1.778 m)   Wt 231 lb (104.8 kg)   BMI 33.15 kg/m    Physical Exam   Musculoskeletal Exam: ***  CDAI Exam: CDAI Score: -- Patient Global: --; Provider Global: -- Swollen: --; Tender: -- Joint Exam 03/10/2023   No joint exam has been documented for this visit   There is currently no  information documented on the homunculus. Go to the Rheumatology activity and complete the homunculus joint exam.  Investigation: No additional findings.  Imaging: No results found.  Recent Labs: Lab Results  Component Value Date   WBC 7.6 03/04/2023   HGB 14.6 03/04/2023   PLT 290 03/04/2023   NA 141 03/04/2023   K 4.3 03/04/2023   CL 99 03/04/2023   CO2 26 03/04/2023   GLUCOSE 83 03/04/2023   BUN 16 03/04/2023   CREATININE 0.90 03/04/2023   BILITOT 0.3 03/04/2023   ALKPHOS 77 03/04/2023   AST 17 03/04/2023   ALT 17 03/04/2023   PROT 7.8 03/04/2023   ALBUMIN 5.1 03/04/2023   CALCIUM 9.9 03/04/2023   GFRAA 101 04/01/2018    Speciality Comments: No specialty comments available.  Procedures:  No procedures performed Allergies: Patient has no known allergies.   Assessment / Plan:     Visit Diagnoses: Capsulitis of left foot  High risk medication use - Sulfasalazine 2 tablets two times  daily.  History of pseudogout - he was started on venlafaxine and allopurinol from sports medicine clinic follow-up  ***  Orders: No orders of the defined types were placed in this encounter.  No orders of the defined types were placed in this encounter.    Follow-Up Instructions: No follow-ups on file.   Fuller Plan, MD  Note - This record has been created using [employer].  Chart creation errors have been sought, but may not always  have been located. Such creation errors do not reflect on  the standard of medical care.

## 2023-02-25 NOTE — Progress Notes (Signed)
Subjective:  Patient ID: Marco Reynolds, male    DOB: 13-Nov-1990,  MRN: [id number]  Chief Complaint  Patient presents with   Foot Pain    "I'm still struggling.  I finished all the other appointments.  I want to see what we can do to get on a better path."    32 y.o. male presents with the above complaint. History confirmed with patient.  He returns for follow-up he is doing better in the ankle area, he has been seeing Dr. Katrinka Blazing and Dr. Dimple Casey, has been started on sulfasalazine, most of his pain now is in the left second toe joint  Objective:  Physical Exam: warm, good capillary refill, no trophic changes or ulcerative lesions, normal DP and PT pulses, normal sensory exam, and today minimal tenderness to palpation of the sinus tarsi or anterior ankle joint line and still does not have any pain over the peroneal tendons in the area of the tear on his MRI, he does have pain and swelling in the second MTP joint on the left foot  Radiographs: Ankle radiographs 08/02/2022 reviewed show no acute osseous abnormalities  Study Result  Narrative & Impression  CLINICAL DATA:  Medial and lateral right ankle pain. Sprained ankle 7 months ago.   EXAM: MRI OF THE RIGHT ANKLE WITHOUT CONTRAST   TECHNIQUE: Multiplanar, multisequence MR imaging of the ankle was performed. No intravenous contrast was administered.   COMPARISON:  Right ankle radiographs 07/31/2022   FINDINGS: TENDONS   Peroneal: There is mild intermediate T2 signal and a mild "chevron configuration" of the peroneus brevis tendon starting at the distal aspect of the fibula (axial series 4, image 16) and involving an approximate 1.5 cm length of the tendon (axial images 16 through 19, coronal series 7 images 16 through 19). The peroneus longus tendon is intact.   Posteromedial: Mild posterior tibial tenosynovitis. The flexor digitorum longus and flexor hallucis longus tendons are intact.   Anterior: The tibialis anterior,  extensor hallucis longus, and extensor digitorum longus tendons are intact.   Achilles: Mild linear longitudinal intermediate T2 signal within the slightly lateral aspect of the midsubstance of the Achilles tendon measuring up to 4 cm in length (sagittal series 6, image 15) but only measuring up to 1 mm in transverse dimension (axial images 8 through 18).   Plantar Fascia: There is minimal marrow edema within the posteroinferior calcaneus at the plantar fascia origin. Minimal edema at the deep aspect of the medial band of the plantar fascia without abnormal thickening (coronal series 7, image 13 and sagittal series 6, image 16).   LIGAMENTS   Lateral: The anterior and posterior talofibular, anterior and posterior tibiofibular, and calcaneofibular ligaments are intact.   Medial: The tibiotalar deep deltoid and tibial spring ligaments are intact.   CARTILAGE   Ankle Joint: Intact cartilage. Mild-to-moderate tibiotalar joint effusion with mild intermediate T2 signal focal synovitis anteriorly (sagittal series 6 images 11 through 14).   Subtalar Joints/Sinus Tarsi: The subtalar cartilage is intact.Fat is preserved within the sinus tarsi.   Bones: Mild dorsal talonavicular cartilage thinning and degenerative osteophytosis.   Other: The tarsal tunnel is unremarkable. The Lisfranc ligament complex is intact.   There is mild medial and lateral ankle subcutaneous fat edema and swelling.   IMPRESSION: 1. Mild peroneus brevis partial-thickness longitudinal split tear. 2. Mild posterior tibial tenosynovitis. 3. Minimal marrow edema within the posteroinferior calcaneus at the plantar fascia origin. Minimal edema at the deep aspect of the medial band of the  plantar fascia without abnormal thickening. 4. Mild-to-moderate tibiotalar joint effusion with mild focal synovitis anteriorly.     Electronically Signed   By: Neita Garnet M.D.   On: 08/22/2022 11:06   Assessment:   1.  Capsulitis of metatarsophalangeal (MTP) joint of left foot      Plan:  Patient was evaluated and treated and all questions answered.  Overall not nearly 100% but has made improvement since I last saw him.  He is under care of Dr. Dimple Casey from rheumatology, review of his notes indicates that he would like him to continue the sulfasalazine for few more months to see if there is any maximal benefit, it does seem to have reduced the joint pain in the ankle itself, he does have inflammatory capsulitis of the left second MTPJ.  We discussed corticosteroid injection which she has not had.  Following consent and prepped with Betadine and using ethyl chloride spray as a topical anesthetic 20 mg Kenalog and 0.5 cc of 0.5% Marcaine plain was injected into the left second MTPJ.  He tolerated this well and was dressed with a bandage.  No follow-ups on file.

## 2023-03-04 ENCOUNTER — Ambulatory Visit (INDEPENDENT_AMBULATORY_CARE_PROVIDER_SITE_OTHER): Payer: BC Managed Care – PPO | Admitting: Sports Medicine

## 2023-03-04 DIAGNOSIS — M2550 Pain in unspecified joint: Secondary | ICD-10-CM

## 2023-03-04 MED ORDER — METHOTREXATE SODIUM 15 MG PO TABS
15.0000 mg | ORAL_TABLET | ORAL | 11 refills | Status: DC
Start: 2023-03-04 — End: 2023-03-07

## 2023-03-04 MED ORDER — METHOTREXATE SODIUM 7.5 MG PO TABS
7.5000 mg | ORAL_TABLET | ORAL | 11 refills | Status: DC
Start: 2023-03-04 — End: 2023-03-04

## 2023-03-04 NOTE — Assessment & Plan Note (Addendum)
This is a pleasant 32 year old male, he has a fairly complicated history of multiple joint aches and pains, synovitis, swelling over the last half a year to a year. He initially presented to me with some knee swelling, an aspiration and injection provided good relief, the aspirate did reveal calcium pyrophosphate crystals. He also had an episode of hypercalcemia, elevated PTH, ultimately seen by endocrinology, everything resolved with vitamin D treatment and hypercalcemia has not recurred. He then developed pain in his ankles, left and then right, he has had evaluations by additional sports medicine doctor, foot and ankle surgery.  MRIs have shown tarsal edema on the left, the right ankle MRI did show synovitis, potential tearing of the peroneus brevis. He has had several injections into his ankles, tibiotalar joint and peroneal sheath, neither of which provided significant long-lasting relief. He had an episode of left first MTP capsulitis that did respond to injection with podiatry. He has worked with rheumatology, serum rheumatoid workup was negative with the exception of a significantly elevated ESR and CRP. He was started on sulfasalazine which did provide some meager relief, currently on 2 pills twice a day with only meager improvement, he has been on this for over a month. His current complaint is predominantly right ankle pain, swelling, he also gets widespread joint aches, swelling, stiffness for greater than a couple of hours in the mornings. He also has endorsed fevers when he has had steroids in the past, I suspect that these fevers may have been due more to the underlying disease process being treated with the steroids rather than the steroids themselves. On exam he does have swelling of his right ankle, no tenderness behind the lateral malleolus, good motion, good strength.  He does have interphalangeal joint swelling mostly at the DIPs, I do not feel any obvious synovitis in the PIPs, MCPs  or wrist. 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 I think in the grand scheme we are dealing with an inflammatory arthritis even though he may not meet full criteria for rheumatoid arthritis.  We will recheck his ESR, CRP as well as a rheumatoid and lupus panel including CCP. As he only had partial response to sulfasalazine at moderately high doses I think it is reasonable to try methotrexate, we will start 7.5 mg weekly, followed by 15 mg weekly if insufficient efficacy, I do however want to run this by his rheumatologist.  Update: After discussion with rheumatology we will be doing 15 mg of methotrexate weekly instead of 7.5.  We will discontinue sulfasalazine for now due to inadequate efficacy on relatively high dose.

## 2023-03-04 NOTE — Progress Notes (Signed)
    Procedures performed today:    Bedside ultrasound of right ankle performed, I do see significant anterior tibiotalar synovitis with the absence of an effusion.  This was an unofficial ultrasound and no images were saved or billed.  Independent interpretation of notes and tests performed by another provider:   None.  Brief History, Exam, Impression, and Recommendations:    Polyarthralgia, inflammatory arthritis This is a pleasant 32 year old male, he has a fairly complicated history of multiple joint aches and pains, synovitis, swelling over the last half a year to a year. He initially presented to me with some knee swelling, an aspiration and injection provided good relief, the aspirate did reveal calcium pyrophosphate crystals. He also had an episode of hypercalcemia, elevated PTH, ultimately seen by endocrinology, everything resolved with vitamin D treatment and hypercalcemia has not recurred. He then developed pain in his ankles, left and then right, he has had evaluations by additional sports medicine doctor, foot and ankle surgery.  MRIs have shown tarsal edema on the left, the right ankle MRI did show synovitis, potential tearing of the peroneus brevis. He has had several injections into his ankles, tibiotalar joint and peroneal sheath, neither of which provided significant long-lasting relief. He had an episode of left first MTP capsulitis that did respond to injection with podiatry. He has worked with rheumatology, serum rheumatoid workup was negative with the exception of a significantly elevated ESR and CRP. He was started on sulfasalazine which did provide some meager relief, currently on 2 pills twice a day with only meager improvement, he has been on this for over a month. His current complaint is predominantly right ankle pain, swelling, he also gets widespread joint aches, swelling, stiffness for greater than a couple of hours in the mornings. He also has endorsed fevers when  he has had steroids in the past, I suspect that these fevers may have been due more to the underlying disease process being treated with the steroids rather than the steroids themselves. On exam he does have swelling of his right ankle, no tenderness behind the lateral malleolus, good motion, good strength.  He does have interphalangeal joint swelling mostly at the DIPs, I do not feel any obvious synovitis in the PIPs, MCPs or wrist. 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 I think in the grand scheme we are dealing with an inflammatory arthritis even though he may not meet full criteria for rheumatoid arthritis.  We will recheck his ESR, CRP as well as a rheumatoid and lupus panel including CCP. As he only had partial response to sulfasalazine at moderately high doses I think it is reasonable to try methotrexate, we will start 7.5 mg weekly, followed by 15 mg weekly if insufficient efficacy, I do however want to run this by his rheumatologist.  Update: After discussion with rheumatology we will be doing 15 mg of methotrexate weekly instead of 7.5.  We will discontinue sulfasalazine for now due to inadequate efficacy on relatively high dose.  I spent 30 minutes of total time managing this patient today, this includes chart review, face to face, and non-face to face time.  ____________________________________________ Marco Reynolds. Marco Reynolds, M.D., ABFM., CAQSM., AME. Primary Care and Sports Medicine [facility]  Adjunct Professor of Family Medicine  [employer]  Restaurant manager, fast food

## 2023-03-06 ENCOUNTER — Encounter: Payer: Self-pay | Admitting: Sports Medicine

## 2023-03-06 DIAGNOSIS — M2550 Pain in unspecified joint: Secondary | ICD-10-CM

## 2023-03-06 LAB — CBC WITH DIFFERENTIAL/PLATELET
Basophils Absolute: 0 10*3/uL (ref 0.0–0.2)
Basos: 0 %
EOS (ABSOLUTE): 0 10*3/uL (ref 0.0–0.4)
Eos: 0 %
Hematocrit: 44.8 % (ref 37.5–51.0)
Hemoglobin: 14.6 g/dL (ref 13.0–17.7)
Immature Grans (Abs): 0 10*3/uL (ref 0.0–0.1)
Immature Granulocytes: 0 %
Lymphocytes Absolute: 2.3 10*3/uL (ref 0.7–3.1)
Lymphs: 30 %
MCH: 28.2 pg (ref 26.6–33.0)
MCHC: 32.6 g/dL (ref 31.5–35.7)
MCV: 87 fL (ref 79–97)
Monocytes Absolute: 0.4 10*3/uL (ref 0.1–0.9)
Monocytes: 6 %
Neutrophils Absolute: 4.9 10*3/uL (ref 1.4–7.0)
Neutrophils: 64 %
Platelets: 290 10*3/uL (ref 150–450)
RBC: 5.17 x10E6/uL (ref 4.14–5.80)
RDW: 13.3 % (ref 11.6–15.4)
WBC: 7.6 10*3/uL (ref 3.4–10.8)

## 2023-03-06 LAB — SEDIMENTATION RATE: Sed Rate: 12 mm/h (ref 0–15)

## 2023-03-06 LAB — SYSTEMIC LUPUS PROFILE A
Chromatin Ab SerPl-aCnc: <0.2 AI (ref 0.0–0.9)
ENA RNP Ab: <0.2 AI (ref 0.0–0.9)
ENA SM Ab Ser-aCnc: <0.2 AI (ref 0.0–0.9)
ENA SSA (RO) Ab: <0.2 AI (ref 0.0–0.9)
ENA SSB (LA) Ab: <0.2 AI (ref 0.0–0.9)
Rheumatoid fact SerPl-aCnc: <10 [IU]/mL (ref <14.0)
dsDNA Ab: 1 [IU]/mL (ref 0–9)

## 2023-03-06 LAB — COMPREHENSIVE METABOLIC PANEL
ALT: 17 IU/L (ref 0–44)
AST: 17 [IU]/L (ref 0–40)
Albumin: 5.1 g/dL (ref 4.1–5.1)
Alkaline Phosphatase: 77 IU/L (ref 44–121)
BUN/Creatinine Ratio: 18 (ref 9–20)
BUN: 16 mg/dL (ref 6–20)
Bilirubin Total: 0.3 mg/dL (ref 0.0–1.2)
CO2: 26 mmol/L (ref 20–29)
Calcium: 9.9 mg/dL (ref 8.7–10.2)
Chloride: 99 mmol/L (ref 96–106)
Creatinine, Ser: 0.9 mg/dL (ref 0.76–1.27)
Globulin, Total: 2.7 g/dL (ref 1.5–4.5)
Glucose: 83 mg/dL (ref 70–99)
Potassium: 4.3 mmol/L (ref 3.5–5.2)
Sodium: 141 mmol/L (ref 134–144)
Total Protein: 7.8 g/dL (ref 6.0–8.5)
eGFR: 115 mL/min/{1.73_m2} (ref >59)

## 2023-03-06 LAB — C-REACTIVE PROTEIN: CRP: 5 mg/L (ref 0–10)

## 2023-03-06 LAB — CK: Total CK: 156 U/L (ref 49–439)

## 2023-03-06 LAB — RHEUMATOID ARTHRITIS PROFILE: Cyclic Citrullin Peptide Ab: 7 U (ref 0–19)

## 2023-03-06 LAB — URIC ACID: Uric Acid: 5.9 mg/dL (ref 3.8–8.4)

## 2023-03-07 ENCOUNTER — Encounter: Payer: BC Managed Care – PPO | Admitting: Internal Medicine

## 2023-03-07 MED ORDER — METHOTREXATE SODIUM 2.5 MG PO TABS: ORAL_TABLET | ORAL | 11 refills | Status: DC

## 2023-03-07 MED ORDER — FOLIC ACID 1 MG PO TABS: 1.0000 mg | ORAL_TABLET | Freq: Every day | ORAL | 11 refills | Status: DC

## 2023-03-10 ENCOUNTER — Ambulatory Visit: Payer: BC Managed Care – PPO | Attending: Internal Medicine | Admitting: Internal Medicine

## 2023-03-10 ENCOUNTER — Encounter: Payer: Self-pay | Admitting: Internal Medicine

## 2023-03-10 VITALS — BP 133/88 | HR 97 | Resp 14 | Ht 70.0 in | Wt 231.0 lb

## 2023-03-10 DIAGNOSIS — Z8739 Personal history of other diseases of the musculoskeletal system and connective tissue: Secondary | ICD-10-CM

## 2023-03-10 DIAGNOSIS — Z79899 Other long term (current) drug therapy: Secondary | ICD-10-CM

## 2023-03-10 DIAGNOSIS — M778 Other enthesopathies, not elsewhere classified: Secondary | ICD-10-CM

## 2023-03-10 DIAGNOSIS — M2550 Pain in unspecified joint: Secondary | ICD-10-CM | POA: Diagnosis not present

## 2023-03-10 MED ORDER — METHOTREXATE SODIUM 2.5 MG PO TABS: 15.0000 mg | ORAL_TABLET | ORAL | 1 refills | Status: DC

## 2023-03-10 NOTE — Patient Instructions (Signed)
Methotrexate Tablets What is this medication? METHOTREXATE (METH oh TREX ate) treats autoimmune conditions, such as arthritis and psoriasis. It works by decreasing inflammation, which can reduce pain and prevent long-term injury to the joints and skin. It may also be used to treat some types of cancer. It works by slowing down the growth of cancer cells. This medicine may be used for other purposes; ask your health care provider or pharmacist if you have questions. COMMON BRAND NAME(S): Rheumatrex, Trexall What should I tell my care team before I take this medication? They need to know if you have any of these conditions: Dehydration Diabetes Fluid in the stomach area or lungs Frequently drink alcohol Having surgery, including dental surgery High cholesterol Immune system problems Inflammatory bowel disease, such as ulcerative colitis Kidney disease Liver disease Low blood cell levels (white cells, red cells, and platelets) Lung disease Recent or ongoing radiation Recent or upcoming vaccine Stomach ulcers, other stomach or intestine problems An unusual or allergic reaction to methotrexate, other medications, foods, dyes, or preservatives Pregnant or trying to get pregnant Breastfeeding How should I use this medication? Take this medication by mouth with water. Take it as directed on the prescription label. Do not take extra. Keep taking this medication until your care team tells you to stop. Know why you are taking this medication and how you should take it. To treat conditions such as arthritis and psoriasis, this medication is taken ONCE A WEEK as a single dose or divided into 3 smaller doses taken 12 hours apart (do not take more than 3 doses 12 hours apart each week). This medication is NEVER taken daily to treat conditions other than cancer. Taking this medication more often than directed can cause serious side effects, even death. Talk to your care team about why you are taking this  medication, how often you will take it, and what your dose is. Ask your care team to put the reason you take this medication on the prescription. If you take this medication ONCE A WEEK, choose a day of the week before you start. Ask your pharmacist to include the day of the week on the label. Avoid "Monday", which could be misread as "Morning". Handling this medication may be harmful. Talk to your care team about how to handle this medication. Special instructions may apply. Talk to your care team about the use of this medication in children. While it may be prescribed for selected conditions, precautions do apply. Overdosage: If you think you have taken too much of this medicine contact a poison control center or emergency room at once. NOTE: This medicine is only for you. Do not share this medicine with others. What if I miss a dose? If you miss a dose, talk with your care team. Do not take double or extra doses. What may interact with this medication? Do not take this medication with any of the following: Acitretin Live virus vaccines Probenecid This medication may also interact with the following: Alcohol Aspirin and aspirin-like medications Certain antibiotics, such as penicillin, neomycin, sulfamethoxazole; trimethoprim Certain medications for stomach problems, such as lansoprazole, omeprazole, pantoprazole Clozapine Cyclosporine Dapsone Folic acid Foscarnet NSAIDs, medications for pain and inflammation, such as ibuprofen or naproxen Phenytoin Pyrimethamine Steroid medications, such as prednisone or cortisone Tacrolimus Theophylline This list may not describe all possible interactions. Give your health care provider a list of all the medicines, herbs, non-prescription drugs, or dietary supplements you use. Also tell them if you smoke, drink alcohol, or use  illegal drugs. Some items may interact with your medicine. What should I watch for while using this medication? Visit your  care team for regular checks on your progress. It may be some time before you see the benefit from this medication. You may need blood work done while you are taking this medication. If your care team has also prescribed folic acid, they may instruct you to skip your folic acid dose on the day you take methotrexate. This medication can make you more sensitive to the sun. Keep out of the sun. If you cannot avoid being in the sun, wear protective clothing and sunscreen. Do not use sun lamps, tanning beds, or tanning booths. Check with your care team if you have severe diarrhea, nausea, and vomiting, or if you sweat a lot. The loss of too much body fluid may make it dangerous for you to take this medication. This medication may increase your risk of getting an infection. Call your care team for advice if you get a fever, chills, sore throat, or other symptoms of a cold or flu. Do not treat yourself. Try to avoid being around people who are sick. Talk to your care team about your risk of cancer. You may be more at risk for certain types of cancers if you take this medication. Talk to your care team if you or your partner may be pregnant. Serious birth defects can occur if you take this medication during pregnancy and for 6 months after the last dose. You will need a negative pregnancy test before starting this medication. Contraception is recommended while taking this medication and for 6 months after the last dose. Your care team can help you find the option that works for you. If your partner can get pregnant, use a condom during sex while taking this medication and for 3 months after the last dose. Do not breastfeed while taking this medication and for 1 week after the last dose. This medication may cause infertility. Talk to your care team if you are concerned about your fertility. What side effects may I notice from receiving this medication? Side effects that you should report to your care team as soon  as possible: Allergic reactions--skin rash, itching, hives, swelling of the face, lips, tongue, or throat Dry cough, shortness of breath or trouble breathing Infection--fever, chills, cough, sore throat, wounds that don't heal, pain or trouble when passing urine, general feeling of discomfort or being unwell Kidney injury--decrease in the amount of urine, swelling of the ankles, hands, or feet Liver injury--right upper belly pain, loss of appetite, nausea, light-colored stool, dark yellow or brown urine, yellowing skin or eyes, unusual weakness or fatigue Low red blood cell level--unusual weakness or fatigue, dizziness, headache, trouble breathing Pain, tingling, or numbness in the hands or feet, muscle weakness, change in vision, confusion or trouble speaking, loss of balance or coordination, trouble walking, seizures Redness, blistering, peeling, or loosening of the skin, including inside the mouth Stomach bleeding--bloody or black, tar-like stools, vomiting blood or brown material that looks like coffee grounds Stomach pain that is severe, does not away, or gets worse Unusual bruising or bleeding Side effects that usually do not require medical attention (report these to your care team if they continue or are bothersome): Diarrhea Dizziness Hair loss Nausea Pain, redness, or swelling with sores inside the mouth or throat Skin reactions on sun-exposed areas Vomiting This list may not describe all possible side effects. Call your doctor for medical advice about side effects. You  may report side effects to FDA at 1-800-FDA-1088. Where should I keep my medication? Keep out of the reach of children and pets. Store at room temperature between 20 and 25 degrees C (68 and 77 degrees F). Protect from light. Keep the container tightly closed. Get rid of any unused medication after the expiration date. To get rid of medications that are no longer needed or have expired: Take the medication to a  medication take-back program. Check with your pharmacy or law enforcement to find a location. If you cannot return the medication, ask your pharmacist or care team how to get rid of this medication safely. NOTE: This sheet is a summary. It may not cover all possible information. If you have questions about this medicine, talk to your doctor, pharmacist, or health care provider.  2024 Elsevier/Gold Standard (2022-05-07 00:00:00)

## 2023-03-24 ENCOUNTER — Other Ambulatory Visit: Payer: Self-pay | Admitting: Internal Medicine

## 2023-03-24 DIAGNOSIS — M2550 Pain in unspecified joint: Secondary | ICD-10-CM

## 2023-04-15 ENCOUNTER — Ambulatory Visit (INDEPENDENT_AMBULATORY_CARE_PROVIDER_SITE_OTHER): Payer: BC Managed Care – PPO | Admitting: Sports Medicine

## 2023-04-15 DIAGNOSIS — M2550 Pain in unspecified joint: Secondary | ICD-10-CM | POA: Diagnosis not present

## 2023-04-15 NOTE — Assessment & Plan Note (Signed)
Previous history: This is a pleasant 32 year old male, he has a fairly complicated history of multiple joint aches and pains, synovitis, swelling over the last half a year to a year. He initially presented to me with some knee swelling, an aspiration and injection provided good relief, the aspirate did reveal calcium pyrophosphate crystals. He also had an episode of hypercalcemia, elevated PTH, ultimately seen by endocrinology, everything resolved with vitamin D treatment and hypercalcemia has not recurred. He then developed pain in his ankles, left and then right, he has had evaluations by additional sports medicine doctor, foot and ankle surgery.  MRIs have shown tarsal edema on the left, the right ankle MRI did show synovitis, potential tearing of the peroneus brevis. He has had several injections into his ankles, tibiotalar joint and peroneal sheath, neither of which provided significant long-lasting relief. He had an episode of left first MTP capsulitis that did respond to injection with podiatry. He has worked with rheumatology, serum rheumatoid workup was negative with the exception of a significantly elevated ESR and CRP. He was started on sulfasalazine which did provide some meager relief, currently on 2 pills twice a day with only meager improvement, he has been on this for over a month. His current complaint is predominantly right ankle pain, swelling, he also gets widespread joint aches, swelling, stiffness for greater than a couple of hours in the mornings. He also has endorsed fevers when he has had steroids in the past, I suspect that these fevers may have been due more to the underlying disease process being treated with the steroids rather than the steroids themselves. On exam he does have swelling of his right ankle, no tenderness behind the lateral malleolus, good motion, good strength.  He does have interphalangeal joint swelling mostly at the DIPs, I do not feel any obvious synovitis  in the PIPs, MCPs or wrist. 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 I think in the grand scheme we are dealing with an inflammatory arthritis even though he may not meet full criteria for rheumatoid arthritis.  We will recheck his ESR, CRP as well as a rheumatoid and lupus panel including CCP. As he only had partial response to sulfasalazine at moderately high doses, we switched him to methotrexate 15 mg weekly.  04/15/2023: He has been on methotrexate for about a month now but unfortunately has not noticed any improvement in pain, swelling, morning stiffness.  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  I explained to Marco Reynolds that treatment at this juncture was probably best accomplished with Dr. Dimple Casey.  I am not really sure where to go from here with regards to methotrexate dosing or where to go from here with consideration of other DMARDs or Biologics.  He does have an appointment coming up with Dr. Dimple Casey next month. I am happy to see Marco Reynolds.

## 2023-04-15 NOTE — Progress Notes (Signed)
    Procedures performed today:    None.  Independent interpretation of notes and tests performed by another provider:   None.  Brief History, Exam, Impression, and Recommendations:    Polyarthralgia, inflammatory arthritis Previous history: This is a pleasant 32 year old male, he has a fairly complicated history of multiple joint aches and pains, synovitis, swelling over the last half a year to a year. He initially presented to me with some knee swelling, an aspiration and injection provided good relief, the aspirate did reveal calcium pyrophosphate crystals. He also had an episode of hypercalcemia, elevated PTH, ultimately seen by endocrinology, everything resolved with vitamin D treatment and hypercalcemia has not recurred. He then developed pain in his ankles, left and then right, he has had evaluations by additional sports medicine doctor, foot and ankle surgery.  MRIs have shown tarsal edema on the left, the right ankle MRI did show synovitis, potential tearing of the peroneus brevis. He has had several injections into his ankles, tibiotalar joint and peroneal sheath, neither of which provided significant long-lasting relief. He had an episode of left first MTP capsulitis that did respond to injection with podiatry. He has worked with rheumatology, serum rheumatoid workup was negative with the exception of a significantly elevated ESR and CRP. He was started on sulfasalazine which did provide some meager relief, currently on 2 pills twice a day with only meager improvement, he has been on this for over a month. His current complaint is predominantly right ankle pain, swelling, he also gets widespread joint aches, swelling, stiffness for greater than a couple of hours in the mornings. He also has endorsed fevers when he has had steroids in the past, I suspect that these fevers may have been due more to the underlying disease process being treated with the steroids rather than the steroids  themselves. On exam he does have swelling of his right ankle, no tenderness behind the lateral malleolus, good motion, good strength.  He does have interphalangeal joint swelling mostly at the DIPs, I do not feel any obvious synovitis in the PIPs, MCPs or wrist. 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 I think in the grand scheme we are dealing with an inflammatory arthritis even though he may not meet full criteria for rheumatoid arthritis.  We will recheck his ESR, CRP as well as a rheumatoid and lupus panel including CCP. As he only had partial response to sulfasalazine at moderately high doses, we switched him to methotrexate 15 mg weekly.  04/15/2023: He has been on methotrexate for about a month now but unfortunately has not noticed any improvement in pain, swelling, morning stiffness.  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  I explained to Marco Reynolds that treatment at this juncture was probably best accomplished with Dr. Dimple Casey.  I am not really sure where to go from here with regards to methotrexate dosing or where to go from here with consideration of other DMARDs or Biologics.  He does have an appointment coming up with Dr. Dimple Casey next month. I am happy to see Marco Reynolds as needed.    ____________________________________________ Marco Reynolds. Marco Reynolds, M.D., ABFM., CAQSM., AME. Primary Care and Sports Medicine [facility]  Adjunct Professor of Family Medicine  [employer]  Restaurant manager, fast food

## 2023-04-16 ENCOUNTER — Ambulatory Visit (INDEPENDENT_AMBULATORY_CARE_PROVIDER_SITE_OTHER): Payer: BC Managed Care – PPO | Admitting: Allergy & Immunology

## 2023-04-16 ENCOUNTER — Encounter: Payer: Self-pay | Admitting: Allergy & Immunology

## 2023-04-16 ENCOUNTER — Other Ambulatory Visit: Payer: Self-pay

## 2023-04-16 VITALS — BP 132/80 | HR 103 | Temp 97.9°F | Resp 18 | Ht 70.0 in | Wt 234.6 lb

## 2023-04-16 DIAGNOSIS — T783XXD Angioneurotic edema, subsequent encounter: Secondary | ICD-10-CM

## 2023-04-16 DIAGNOSIS — G8929 Other chronic pain: Secondary | ICD-10-CM | POA: Diagnosis not present

## 2023-04-16 DIAGNOSIS — M25571 Pain in right ankle and joints of right foot: Secondary | ICD-10-CM

## 2023-04-16 DIAGNOSIS — M1120 Other chondrocalcinosis, unspecified site: Secondary | ICD-10-CM | POA: Insufficient documentation

## 2023-04-16 LAB — CBC WITH DIFFERENTIAL/PLATELET
Basophils Absolute: 0 10*3/uL (ref 0.0–0.2)
Basos: 0 %
EOS (ABSOLUTE): 0 10*3/uL (ref 0.0–0.4)
Eos: 1 %
Hematocrit: 45.2 % (ref 37.5–51.0)
Hemoglobin: 14.8 g/dL (ref 13.0–17.7)
Immature Grans (Abs): 0 10*3/uL (ref 0.0–0.1)
Immature Granulocytes: 0 %
Lymphocytes Absolute: 2.1 10*3/uL (ref 0.7–3.1)
Lymphs: 31 %
MCH: 28.4 pg (ref 26.6–33.0)
MCHC: 32.7 g/dL (ref 31.5–35.7)
MCV: 87 fL (ref 79–97)
Monocytes Absolute: 0.4 10*3/uL (ref 0.1–0.9)
Monocytes: 6 %
Neutrophils Absolute: 4.1 10*3/uL (ref 1.4–7.0)
Neutrophils: 62 %
Platelets: 302 10*3/uL (ref 150–450)
RBC: 5.21 x10E6/uL (ref 4.14–5.80)
RDW: 13.3 % (ref 11.6–15.4)
WBC: 6.6 10*3/uL (ref 3.4–10.8)

## 2023-04-16 LAB — COMPREHENSIVE METABOLIC PANEL
ALT: 28 [IU]/L (ref 0–44)
AST: 21 [IU]/L (ref 0–40)
Albumin: 5 g/dL (ref 4.1–5.1)
Alkaline Phosphatase: 85 [IU]/L (ref 44–121)
BUN/Creatinine Ratio: 10 (ref 9–20)
BUN: 10 mg/dL (ref 6–20)
Bilirubin Total: 0.4 mg/dL (ref 0.0–1.2)
CO2: 23 mmol/L (ref 20–29)
Calcium: 9.9 mg/dL (ref 8.7–10.2)
Chloride: 100 mmol/L (ref 96–106)
Creatinine, Ser: 0.97 mg/dL (ref 0.76–1.27)
Globulin, Total: 2.5 g/dL (ref 1.5–4.5)
Glucose: 87 mg/dL (ref 70–99)
Potassium: 4.5 mmol/L (ref 3.5–5.2)
Sodium: 140 mmol/L (ref 134–144)
Total Protein: 7.5 g/dL (ref 6.0–8.5)
eGFR: 105 mL/min/{1.73_m2} (ref >59)

## 2023-04-16 NOTE — Patient Instructions (Addendum)
1. Chronic pain of right ankle - We will do some testing to see if this is related.  - However, we will know more at the next visit.   2. Angioedema in the setting of seasonal allergic rhinitis - Because of insurance stipulations, we cannot do skin testing on the same day as your first visit. - We are all working to fight this, but for now we need to do two separate visits.  - We will know more after we do testing at the next visit.  - The skin testing visit can be squeezed in at your convenience.  - Then we can make a more full plan to address all of your symptoms. - Be sure to stop your antihistamines for 3 days before this appointment.   3. Return in about 1 week (around 04/23/2023) for SKIN TESTING (1-55). You can have the follow up appointment with Dr. Dellis Anes or a Nurse Practicioner (our Nurse Practitioners are excellent and always have Physician oversight!).    Please inform us of any Emergency Department visits, hospitalizations, or changes in symptoms. Call us before going to the ED for breathing or allergy symptoms since we might be able to fit you in for a sick visit. Feel free to contact us anytime with any questions, problems, or concerns.  It was a pleasure to meet you today!  Websites that have reliable patient information: 1. American Academy of Asthma, Allergy, and Immunology: www.aaaai.org 2. Food Allergy Research and Education (FARE): foodallergy.org 3. Mothers of Asthmatics: http://www.asthmacommunitynetwork.org 4. American College of Allergy, Asthma, and Immunology: www.acaai.org      "Like" [place] on Facebook and Instagram for our latest updates!      A healthy democracy works best when [employer] participate! Make sure you are registered to vote! If you have moved or changed any of your contact information, you will need to get this updated before voting! Scan the QR codes below to learn more!

## 2023-04-16 NOTE — Progress Notes (Unsigned)
NEW PATIENT  Date of Service/Encounter:  04/16/23  Consult requested by: Arrie Senate, FNP   Assessment:   Right ankle pain - tentatively being treated as seronegative arthritis   Angioedema  Seasonal allergic rhinitis - planning for skin testing at the next visit  Plan/Recommendations:   1. Chronic pain of right ankle - We will do some testing to see if this is related.  - However, we will know more at the next visit.   2. Angioedema in the setting of seasonal allergic rhinitis - Because of insurance stipulations, we cannot do skin testing on the same day as your first visit. - We are all working to fight this, but for now we need to do two separate visits.  - We will know more after we do testing at the next visit.  - The skin testing visit can be squeezed in at your convenience.  - Then we can make a more full plan to address all of your symptoms. - Be sure to stop your antihistamines for 3 days before this appointment.   3. Return in about 1 week (around 04/23/2023) for SKIN TESTING (1-55). You can have the follow up appointment with Dr. Dellis Anes or a Nurse Practicioner (our Nurse Practitioners are excellent and always have Physician oversight!).   This note in its entirety was forwarded to the Provider who requested this consultation.  Subjective:   Marco Reynolds is a 32 y.o. 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  Discussed the use of AI scribe software for clinical note transcription with the patient and/or guardian, who gave verbal consent to proceed.  Marco Reynolds was referred by Arrie Senate, FNP.     Marco Reynolds is a 32 y.o. male presenting for an evaluation of ankle pain .  Marco Reynolds presents with persistent right ankle pain that began on March 16th. The pain onset was spontaneous, with no known precipitating event. Despite extensive workup including X-rays and MRIs, no definitive cause has been identified. The patient has trialed various medications, including sulfasalazine and methotrexate, with no significant relief. The most effective management so far has been a combination of Tylenol and ibuprofen, but this only reduces the pain to a manageable level, not complete relief.  The patient reports that the ankle was more swollen in the initial stages of the pain, but the swelling has since decreased. There is a history of a severe ankle sprain the previous  year, but there was a six-month symptom-free period between the sprain and the onset of the current pain, making a connection unlikely.  He had an MRI in April that showed chronic tendon changes as well as a tibiotalar joint effusion. MRIs have shown tarsal edema on the left, the right ankle MRI did show synovitis, potential tearing of the peroneus brevis. He was sick with COVID in January, although the onset of the symptoms was in March.  He has been followed by Dr.  Dimple Casey with rheumatology.  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  He also had a kidney stone at the time. Dr. Dimple Casey obtained multiple labs.  They showed an elevated ESR as well as an elevated CRP.  C3 was high at 211.  ANA and RNP antibodies were negative.  He was started on sulfasalazine and then changed to methotrexate he has also been tried on allopurinol.  He has had a negative lupus profile, rheumatoid arthritis profile, uric acid, CK, RNP, HLA-B27, uric acid, and ANA.  Will his CRP and ESR have been elevated, they have normalized at this point.  An EBV panel showed evidence of a past infection.  The patient has a history of seasonal allergies, managed with Allegra, but no other significant medical history. There is a family history of gout, but the patient's symptoms do not align with this diagnosis. The patient's work environment involves some exposure to dust, but no extreme chemicals or hazards.  The patient has received three steroid injections directly into the joint, but these provided no relief. He has had multiple steroid shots, but no other significant side effects were reported. The pain is exacerbated by movement and is not completely relieved by over-the-counter pain medications. The patient has two young children and reports difficulty in keeping up with them due to the pain. This is really a last ditch effort to try to figure out what is going on.   Notably, he has not had any fevers during these episodes.  He denies any stomach pain, urticaria, or other evidence of inflammatory disease.  He has not had any weight loss.  He has no history of recurrent infections and does not remember the last time he really needed antibiotics.  There is no family history of immunodeficiency.  Immunodeficiency screen: Eight or more ear infections in one year: NO Two or more serious sinus  infections in one year: NO Two or more bouts of pneumonia in one year: NO Two or more deep-seated infections, or infections in unusual areas: NO Recurrent deep skin or organ abscesses: NO Need for intravenous antibiotic therapy to clear infection: NO Infections with unusual or opportunistic organisms: NO Family history of primary immunodeficiency: NO Recurrent fevers: NO  Physical signs screen: Poor growth, failure to thrive: NO Absent lymph nodes or tonsils: NO Skin lesions: telangiectasias, petechiae, dermatomyositis, lupus-like rash: NO Ataxia (with ataxia-telangiectasia): NO Oral thrush after one year of age: NO Oral ulcers: NO   Otherwise, there is no history of other atopic diseases, including asthma, food allergies, drug allergies, stinging insect allergies, eczema, urticaria, or contact dermatitis. There is no significant infectious history. Vaccinations are up to date.    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   History of pseudogout 01/07/2023   Capsulitis of left foot 11/25/2022  High risk medication use 11/25/2022   Tenosynovitis of tibialis posterior tendon 10/11/2022   Peroneal tendon tear, right, initial encounter 08/30/2022   Ankle effusion, right 07/31/2022   Hypercalcemia 02/04/2020   Epididymitis, left 01/28/2020   Left ankle pain 07/17/2018   Obesity (BMI 30.0-34.9) 04/01/2018   Deviated nasal septum 07/21/2017   Nasal turbinate hypertrophy 07/21/2017   Seasonal allergic rhinitis due to pollen 12/16/2016    Medication List:  Allergies as of 04/16/2023   No Known Allergies      Medication List        Accurate as of April 16, 2023 11:59 PM. If you have any questions, ask your nurse or doctor.          azelastine 0.1 % nasal spray Commonly known as: ASTELIN   Effexor XR 37.5 MG 24 hr capsule Generic drug: venlafaxine XR Take 1  capsule (37.5 mg total) by mouth daily with breakfast.   fexofenadine 180 MG tablet Commonly known as: ALLEGRA   fluticasone 50 MCG/ACT nasal spray Commonly known as: FLONASE   folic acid 1 MG tablet Commonly known as: FOLVITE Take 1 tablet (1 mg total) by mouth daily.   methotrexate 2.5 MG tablet Commonly known as: RHEUMATREX Take 6 tablets (15 mg total) by mouth once a week.   Multivitamin Adult Tabs   rosuvastatin 10 MG tablet Commonly known as: CRESTOR Take 10 mg by mouth daily.   Vitamin D (Cholecalciferol) 25 MCG (1000 UT) Tabs        Birth History: non-contributory  Developmental History: non-contributory  Past Surgical History: Past Surgical History:  Procedure Laterality Date   MENISCUS REPAIR Right      Family History: Family History  Problem Relation Age of Onset   Allergic rhinitis Mother    Healthy Mother    Gestational diabetes Mother    Miscarriages / [place] Mother    Allergic rhinitis Father    Healthy Father    Hyperlipidemia Father    Hypertension Father    Kidney disease Father    Gout Father    Gout Brother    Healthy Brother    Healthy Brother    Healthy Daughter    Healthy Son      Social History: Mandrell lives at home with his family including his two children. .  They live in a house that is 34 years old.  There is no mildew damage.  There is LVP flooring throughout the home.  Family keep on the cooling.  There is a goldendoodle inside of the home.  There are no dust mite covers on the bedding.  There is no tobacco exposure.  There is exposure to fumes, chemicals, and dust in his workplace.  They do not live near an interstate or industrial area.  There is no tobacco exposure.  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 He is over 700 employees in the county.    Review of systems otherwise negative other than that mentioned in the HPI.    Objective:   Blood pressure 132/80, pulse (!) 103,  temperature 97.9 F (36.6 C), resp. rate 18, height 5\' 10"  (1.778 m), weight 234 lb 9.6 oz (106.4 kg), SpO2 96%. Body mass index is 33.66 kg/m.     Physical Exam Vitals reviewed.  Constitutional:      Appearance: He is well-developed.  HENT:     Head: Normocephalic and atraumatic.     Right Ear: Tympanic membrane,  ear canal and external ear normal. No drainage, swelling or tenderness. Tympanic membrane is not injected, scarred, erythematous, retracted or bulging.     Left Ear: Tympanic membrane, ear canal and external ear normal. No drainage, swelling or tenderness. Tympanic membrane is not injected, scarred, erythematous, retracted or bulging.     Nose: Rhinorrhea present. No nasal deformity, septal deviation or mucosal edema.     Right Sinus: No maxillary sinus tenderness or frontal sinus tenderness.     Left Sinus: No maxillary sinus tenderness or frontal sinus tenderness.     Comments: Some clear rhinorrhea.    Mouth/Throat:     Mouth: Mucous membranes are not pale and not dry.     Pharynx: Uvula midline.  Eyes:     General:        Right eye: No discharge.        Left eye: No discharge.     Conjunctiva/sclera: Conjunctivae normal.     Right eye: Right conjunctiva is not injected. No chemosis.    Left eye: Left conjunctiva is not injected. No chemosis.    Pupils: Pupils are equal, round, and reactive to light.  Cardiovascular:     Rate and Rhythm: Normal rate and regular rhythm.     Heart sounds: Normal heart sounds.  Pulmonary:     Effort: Pulmonary effort is normal. No tachypnea, accessory muscle usage or respiratory distress.     Breath sounds: Normal breath sounds. No wheezing, rhonchi or rales.  Chest:     Chest wall: No tenderness.  Abdominal:     Tenderness: There is no abdominal tenderness. There is no guarding or rebound.  Lymphadenopathy:     Head:     Right side of head: No submandibular, tonsillar or occipital adenopathy.     Left side of head: No  submandibular, tonsillar or occipital adenopathy.     Cervical: No cervical adenopathy.  Skin:    General: Skin is warm.     Capillary Refill: Capillary refill takes less than 2 seconds.     Coloration: Skin is not pale.     Findings: No abrasion, erythema, petechiae or rash. Rash is not papular, urticarial or vesicular.  Neurological:     Mental Status: He is alert.  Psychiatric:        Behavior: Behavior is cooperative.      Diagnostic studies: deferred due to insurance stipulations that require a separate visit for testing          Malachi Bonds, MD [facility]

## 2023-04-22 ENCOUNTER — Encounter: Payer: Self-pay | Admitting: Family Medicine

## 2023-04-22 ENCOUNTER — Ambulatory Visit (INDEPENDENT_AMBULATORY_CARE_PROVIDER_SITE_OTHER): Payer: BC Managed Care – PPO | Admitting: Family Medicine

## 2023-04-22 DIAGNOSIS — J3089 Other allergic rhinitis: Secondary | ICD-10-CM | POA: Diagnosis not present

## 2023-04-22 DIAGNOSIS — J302 Other seasonal allergic rhinitis: Secondary | ICD-10-CM

## 2023-04-22 DIAGNOSIS — T783XXD Angioneurotic edema, subsequent encounter: Secondary | ICD-10-CM

## 2023-04-22 NOTE — Progress Notes (Signed)
   [address] [place] [address] Dept: [phone]  FOLLOW UP NOTE  Patient ID: Marco Reynolds, male    DOB: 1991/02/18  Age: 32 y.o. MRN: [id number] Date of Office Visit: 04/22/2023  Assessment  Chief Complaint: Follow-up  HPI Marco Reynolds is a 32 year old male who presents to the clinic for a follow-up visit.  He was last seen in this clinic on 04/16/2023 by Dr. Dellis Anes as a new patient for evaluation of right ankle pain, angioedema, and allergic rhinitis.  At today's visit, he reports that he is feeling well overall with no cardiopulmonary, gastrointestinal, or integumentary symptoms.  He has not had any antihistamines 3 days.  He does report his allergic rhinitis seems to be seasonal.  His current medications are listed in the chart.  Drug Allergies:  No Known Allergies  Physical Exam: There were no vitals taken for this visit.   Physical Exam Constitutional:      Appearance: Normal appearance.  HENT:     Head: Normocephalic and atraumatic.  Musculoskeletal:        General: Normal range of motion.  Skin:    General: Skin is warm and dry.  Neurological:     Mental Status: He is alert and oriented to person, place, and time.  Psychiatric:        Mood and Affect: Mood normal.        Behavior: Behavior normal.        Thought Content: Thought content normal.        Judgment: Judgment normal.     Diagnostics: Percutaneous environmental allergy skin testing was positive to grass pollens, tree pollens, and dust mite with adequate controls  Intradermal environmental allergy skin testing was positive to dog, cat, and ragweed with an adequate control  Assessment and Plan: 1. Angioedema, subsequent encounter   2. Seasonal and perennial allergic rhinitis     Patient Instructions  Allergic rhinitis Your skin testing was positive to grass pollen, tree pollen, ragweed pollen, dog, cat, and dust mites.  You may take an antihistamine once a day as  needed for a runny nose or itch. Remember to rotate to a different antihistamine about every 3 months. Some examples of over the counter antihistamines include Zyrtec (cetirizine), Xyzal (levocetirizine), Allegra (fexofenadine), and Claritin (loratidine).  Begin Flonase 2 sprays in each nostril once a day as needed for her stuffy nose.  In the right nostril, point the applicator out toward the right ear. In the left nostril, point the applicator out toward the left ear Consider saline nasal rinses as needed for nasal symptoms. Use this before any medicated nasal sprays for best result Consider allergen immunotherapy if your symptoms are not well-controlled with the treatment plan as listed above  Angioedema A set of labs has been ordered to help [place] evaluate your swelling. We will call you when the results become available  Call the clinic if this treatment plan is not working well for you  Follow up in 3 months or sooner if needed.   Return in about 3 months (around 07/21/2023), or if symptoms worsen or fail to improve.    Thank you for the opportunity to care for this patient.  Please do not hesitate to contact me with questions.  Thermon Leyland, FNP [facility]

## 2023-04-22 NOTE — Patient Instructions (Addendum)
Allergic rhinitis Your skin testing was positive to grass pollen, tree pollen, ragweed pollen, dog, cat, and dust mites.  You may take an antihistamine once a day as needed for a runny nose or itch. Remember to rotate to a different antihistamine about every 3 months. Some examples of over the counter antihistamines include Zyrtec (cetirizine), Xyzal (levocetirizine), Allegra (fexofenadine), and Claritin (loratidine).  Begin Flonase 2 sprays in each nostril once a day as needed for her stuffy nose.  In the right nostril, point the applicator out toward the right ear. In the left nostril, point the applicator out toward the left ear Consider saline nasal rinses as needed for nasal symptoms. Use this before any medicated nasal sprays for best result Consider allergen immunotherapy if your symptoms are not well-controlled with the treatment plan as listed above  Angioedema A set of labs has been ordered to help [place] evaluate your swelling. We will call you when the results become available  Call the clinic if this treatment plan is not working well for you  Follow up in 3 months or sooner if needed.  Reducing Pollen Exposure The American Academy of Allergy, Asthma and Immunology suggests the following steps to reduce your exposure to pollen during allergy seasons. Do not hang sheets or clothing out to dry; pollen may collect on these items. Do not mow lawns or spend time around freshly cut grass; mowing stirs up pollen. Keep windows closed at night.  Keep car windows closed while driving. Minimize morning activities outdoors, a time when pollen counts are usually at their highest. Stay indoors as much as possible when pollen counts or humidity is high and on windy days when pollen tends to remain in the air longer. Use air conditioning when possible.  Many air conditioners have filters that trap the pollen spores. Use a HEPA room air filter to remove pollen form the indoor air you  breathe.   Control of Dust Mite Allergen Dust mites play a major role in allergic asthma and rhinitis. They occur in environments with high humidity wherever human skin is found. Dust mites absorb humidity from the atmosphere (ie, they do not drink) and feed on organic matter (including shed human and animal skin). Dust mites are a microscopic type of insect that you cannot see with the naked eye. High levels of dust mites have been detected from mattresses, pillows, carpets, upholstered furniture, bed covers, clothes, soft toys and any woven material. The principal allergen of the dust mite is found in its feces. A gram of dust may contain 1,000 mites and 250,000 fecal particles. Mite antigen is easily measured in the air during house cleaning activities. Dust mites do not bite and do not cause harm to humans, other than by triggering allergies/asthma.  Ways to decrease your exposure to dust mites in your home:  1. Encase mattresses, box springs and pillows with a mite-impermeable barrier or cover  2. Wash sheets, blankets and drapes weekly in hot water (130 F) with detergent and dry them in a dryer on the hot setting.  3. Have the room cleaned frequently with a vacuum cleaner and a damp dust-mop. For carpeting or rugs, vacuuming with a vacuum cleaner equipped with a high-efficiency particulate air (HEPA) filter. The dust mite allergic individual should not be in a room which is being cleaned and should wait 1 hour after cleaning before going into the room.  4. Do not sleep on upholstered furniture (eg, couches).  5. If possible removing carpeting, upholstered  furniture and drapery from the home is ideal. Horizontal blinds should be eliminated in the rooms where the person spends the most time (bedroom, study, television room). Washable vinyl, roller-type shades are optimal.  6. Remove all non-washable stuffed toys from the bedroom. Wash stuffed toys weekly like sheets and blankets above.  7.  Reduce indoor humidity to less than 50%. Inexpensive humidity monitors can be purchased at most hardware stores. Do not use a humidifier as can make the problem worse and are not recommended.  Control of Dog or Cat Allergen Avoidance is the best way to manage a dog or cat allergy. If you have a dog or cat and are allergic to dog or cats, consider removing the dog or cat from the home. If you have a dog or cat but don't want to find it a new home, or if your family wants a pet even though someone in the household is allergic, here are some strategies that may help keep symptoms at bay:  Keep the pet out of your bedroom and restrict it to only a few rooms. Be advised that keeping the dog or cat in only one room will not limit the allergens to that room. Don't pet, hug or kiss the dog or cat; if you do, wash your hands with soap and water. High-efficiency particulate air (HEPA) cleaners run continuously in a bedroom or living room can reduce allergen levels over time. Regular use of a high-efficiency vacuum cleaner or a central vacuum can reduce allergen levels. Giving your dog or cat a bath at least once a week can reduce airborne allergen.

## 2023-04-25 ENCOUNTER — Encounter: Payer: Self-pay | Admitting: Internal Medicine

## 2023-04-28 NOTE — Progress Notes (Addendum)
 Office Visit Note  Patient: Marco Reynolds             Date of Birth: 1991-04-17           MRN: [id number]             PCP: Cathlene Marry Lenis, FNP Referring: Levora Reyes SAUNDERS, MD Visit Date: 05/12/2023   Subjective:  Follow-up (Patient states he did get allergy  tested through [facility] but nothing significant came back. Patient states he still feels the same after being on the methotrexate . Patient states he tried being more active but it has caused him a lot of pain. )  Discussed the use of AI scribe software for clinical note transcription with the patient, who gave verbal consent to proceed.  History of Present Illness   Marco Reynolds is a 32 y.o. male here for follow up for right ankle pain and left toe pain with persistent joint swelling now just starting methotrexate  15 mg PO weekly and folic acid  1 mg daily. He has been on methotrexate  for over two months, supplemented with folic acid . They report no side effects from the medication, but also no noticeable improvement in their condition. The patient's blood work, conducted during a follow-up visit, appeared normal. They also underwent allergy  testing, which revealed sensitivities to cats, dogs, and certain trees. Resuming allergy  medication has improved their overall well-being, but has not impacted their joint pain.  The patient's pain is primarily located in their ankle, described as a throbbing sensation that intensifies with movement and can disrupt sleep. The pain typically subsides slightly after a few days of rest. The patient has tried to increase their physical activity, but this has resulted in increased pain. They have not been on any other steroids since their last visit, although they did receive a steroid shot in their left second toe several months ago.  The patient has also noticed a change in their gait due to the pain, walking primarily on their heel to avoid putting weight on the affected ankle. This  has led to additional discomfort in their knee, hip, and back. The patient has not observed any visible swelling or redness in the ankle, but reports that it feels puffy compared to the other ankle.  The patient has previously tried multiple treatments for their condition, including three steroid shots in the right ankle, which did not provide any relief. They express a desire to ensure the appropriateness of any further treatment escalation, given the lack of improvement with current therapies.      Previous HPI 03/10/2023 Marco Reynolds is a 32 y.o. male here for follow up for right ankle pain and left toe pain with persistent joint swelling now just starting methotrexate  15 mg PO weekly and folic acid  1 mg daily.  He was recommended to switch after visit with Dr. Curtis in sports medicine clinic with persistent ankle swelling despite continuing sulfasalazine  treatment.  Seems like the dosing instructions were unclear he started taking this medicine 1 tablet daily over the weekend after stopping sulfasalazine .  He thinks this may be causing headaches otherwise has not noticed any difference.  Besides that in the interval he had a steroid injection in the left great toe for pain and swelling and this was very effective unlike the 3 injections he has tried in the right ankle.   Previous HPI 01/07/2023 Marco Reynolds is a 32 y.o. male here for follow up for right ankle pain and left toe pain with  persistent joint swelling after trial of starting sulfasalazine  1000 mg twice daily.  Lab test at initial visit negative for any serologic marker antibody markers and negative for HLA-B27 but showing persistent elevated CRP and sedimentation rate.  He started the sulfasalazine  500 mg twice daily for 1 week then 1000 mg twice daily now at about 1 month duration so far has not noticed any appreciable change in symptoms.  He followed up with Dr. Claudene started on allopurinol  which he is  tolerating.  Was also started on venlafaxine  initially 37.5 mg and increase to 75 mg daily.  Despite all this still has symptoms pain is more with use and weightbearing versus direct pressure on the affected areas.  Sometimes gets a catch sensation in the right ankle that extends onto the medial side and bottom of the arch.  Persistent swelling at the left second toe is now worse at the base of the toe and less distally.   Previous HPI 11/25/22  Marco Reynolds is a 32 y.o. male here for evaluation of joint pain and swelling mostly in the right ankle but few other joints also involved.  This started since March 16 he woke up with acute onsets of significant pain and swelling.  Tried taking oral anti-inflammatory medications with limited benefit.  Saw Dr. Claudene had imaging trial of several steroid injections for concern of ankle inflammation or tendinopathy.  Went for MRI of the ankle in April demonstrated some chronic tendon changes also tibiotalar joint effusion.  In total to lateral and 1 medial steroid injection these were somewhat helpful and lateral ankle pain did improve but incomplete benefit.  During this time was also treated with a round of medication for bronchitis that resolved but did not see relief in joint pain with resolution.  More recently also started to have visible swelling and pain at the base of second and third toes on the left foot and a feeling like a nodule or mass pressing on the bottom of the foot while weightbearing.  Currently taking meloxicam  15 mg daily instead of over-the-counter NSAIDs which is partially controlling symptoms but still having a lot of activity and mobility limitations. Prior to onset of the symptoms he did not recall any new injury or change in activity.  He was sick with uncomplicated course of COVID in January. He had a previous history of right knee and left ankle pain and effusions in 2020 found to have pseudogout confirmed on knee aspirate with  intracellular calcium pyrophosphate crystals.  Also around that time did have 1 small kidney stone.  Also with hypercalcemia and hyperparathyroidism these are thought incidental to hide baseline serum protein level and mild vitamin D  insufficiency that resolved.   Review of Systems  Constitutional:  Negative for fatigue.  HENT:  Negative for mouth sores and mouth dryness.   Eyes:  Negative for dryness.  Respiratory:  Negative for shortness of breath.   Cardiovascular:  Negative for chest pain and palpitations.  Gastrointestinal:  Negative for blood in stool, constipation and diarrhea.  Endocrine: Negative for increased urination.  Genitourinary:  Negative for involuntary urination.  Musculoskeletal:  Positive for joint pain, joint pain, joint swelling, muscle weakness, morning stiffness and muscle tenderness. Negative for gait problem, myalgias and myalgias.  Skin:  Negative for color change, rash, hair loss and sensitivity to sunlight.  Allergic/Immunologic: Negative for susceptible to infections.  Neurological:  Positive for headaches. Negative for dizziness.  Hematological:  Negative for swollen glands.  Psychiatric/Behavioral:  Positive  for sleep disturbance. Negative for depressed mood. The patient is not nervous/anxious.     PMFS History:  Patient Active Problem List   Diagnosis Date Noted   Mixed hyperlipidemia 05/08/2023   Pseudogout 04/16/2023   Polyarthralgia, inflammatory arthritis 03/04/2023   History of pseudogout 01/07/2023   Capsulitis of left foot 11/25/2022   High risk medication use 11/25/2022   Tenosynovitis of tibialis posterior tendon 10/11/2022   Peroneal tendon tear, right, initial encounter 08/30/2022   Ankle effusion, right 07/31/2022   Hypercalcemia 02/04/2020   Epididymitis, left 01/28/2020   Left ankle pain 07/17/2018   Obesity (BMI 30.0-34.9) 04/01/2018   Deviated nasal septum 07/21/2017   Nasal turbinate hypertrophy 07/21/2017   Seasonal allergic  rhinitis due to pollen 12/16/2016    Past Medical History:  Diagnosis Date   Allergy     Angio-edema    Effusion of knee joint right 07/17/2018   History of hyperlipidemia     Family History  Problem Relation Age of Onset   Allergic rhinitis Mother    Healthy Mother    Gestational diabetes Mother    Miscarriages / Stillbirths Mother    Allergic rhinitis Father    Healthy Father    Hyperlipidemia Father    Hypertension Father    Kidney disease Father    Gout Father    Gout Brother    Healthy Brother    Healthy Brother    Healthy Daughter    Healthy Son    Past Surgical History:  Procedure Laterality Date   MENISCUS REPAIR Right    Social History   Social History Narrative   Not on file   Immunization History  Administered Date(s) Administered   DTaP 03/13/1989, 05/13/1989, 07/08/1992, 07/23/1992, 03/11/1994   HIB (PRP-OMP) 03/13/1989, 05/13/1989, 07/08/1989, 05/07/1990   Hepatitis A, Adult 09/09/2017   Hepatitis B 01/29/2001, 03/05/2001, 07/16/2001   Hepatitis B, ADULT 09/18/2017, 11/04/2017, 01/08/2018   IPV 03/13/1989, 05/13/1989, 07/23/1989, 03/11/1994   Influenza Inj Mdck Quad Pf 02/11/2022   Influenza, Seasonal, Injecte, Preservative Fre 05/08/2023   Influenza,inj,Quad PF,6+ Mos 02/13/2017, 02/09/2019   Influenza-Unspecified 02/06/2016, 01/27/2018, 02/07/2020, 02/11/2022   MMR 05/07/1990, 03/11/1994   Meningococcal Mcv4o 09/09/2017   Moderna Sars-Covid-2 Vaccination 07/01/2019, 08/03/2019, 03/02/2020   Tdap 10/24/2006, 07/14/2014     Objective: Vital Signs: BP (!) 151/93 (BP Location: Left Arm, Patient Position: Sitting, Cuff Size: Normal)   Pulse 84   Resp 14   Ht 5' 10 (1.778 m)   Wt 241 lb (109.3 kg)   BMI 34.58 kg/m    Physical Exam Eyes:     Conjunctiva/sclera: Conjunctivae normal.  Cardiovascular:     Rate and Rhythm: Normal rate and regular rhythm.  Pulmonary:     Effort: Pulmonary effort is normal.     Breath sounds: Normal breath  sounds.  Musculoskeletal:     Right lower leg: No edema.     Left lower leg: No edema.  Lymphadenopathy:     Cervical: No cervical adenopathy.  Skin:    General: Skin is warm and dry.     Findings: No rash.  Neurological:     Mental Status: He is alert.  Psychiatric:        Mood and Affect: Mood normal.     Musculoskeletal Exam:  Wrists full ROM no tenderness or swelling Fingers full ROM no tenderness or swelling Knees full ROM no tenderness or swelling Right ankle with mild swelling, erythema, no pain on palpation. Severe pain on inversion, mild discomfort on eversion,  no pain on dorsiflexion. Altered gait to avoid pain with compensatory changes in knee, hip, and back.  No MTP squeeze tenderness   Investigation: No additional findings.  Imaging: No results found.  Recent Labs: Lab Results  Component Value Date   WBC 6.6 04/15/2023   HGB 14.8 04/15/2023   PLT 302 04/15/2023   NA 140 04/15/2023   K 4.5 04/15/2023   CL 100 04/15/2023   CO2 23 04/15/2023   GLUCOSE 87 04/15/2023   BUN 10 04/15/2023   CREATININE 0.97 04/15/2023   BILITOT 0.4 04/15/2023   ALKPHOS 85 04/15/2023   AST 21 04/15/2023   ALT 28 04/15/2023   PROT 7.5 04/15/2023   ALBUMIN 5.0 04/15/2023   CALCIUM 9.9 04/15/2023   GFRAA 101 04/01/2018    Speciality Comments: No specialty comments available.  Procedures:  No procedures performed Allergies: Patient has no known allergies.   Assessment / Plan:     Visit Diagnoses: Capsulitis of left foot History of pseudogout - Plan: CT ANKLE RIGHT WO CONTRAST Persistent pain despite methotrexate  therapy for over two months. No appreciable improvement noted. Pain exacerbated by movement, with throbbing sensation.  Still difficult to isolate exactly was going on as he remains unremarkable by blood testing and no fluid collection amenable to aspiration for analysis.  Prior history of pseudogout remains a consideration as seronegative inflammation. -Increase  methotrexate  dose from 15mg  to 25mg  weekly. -Order dual energy CT scan to identify potential crystalline inflammatory arthritis or progression of bone changes. -Consider changing medication if no improvement with increased methotrexate  dose, potentially to a biologic such as Humira or Enbrel.  High risk medication use Reviewed recent labs CBC CMP from normal no problem with methotrexate .  Allergies Improvement in general well-being with resumption of allergy  medications. No impact on ankle pain. -Continue current allergy  medications.   Orders: Orders Placed This Encounter  Procedures   CT ANKLE RIGHT WO CONTRAST   Meds ordered this encounter  Medications   methotrexate  (RHEUMATREX) 2.5 MG tablet    Sig: Take 10 tablets (25 mg total) by mouth once a week.    Dispense:  40 tablet    Refill:  2   folic acid  (FOLVITE ) 1 MG tablet    Sig: Take 1 tablet (1 mg total) by mouth daily.    Dispense:  90 tablet    Refill:  0     Follow-Up Instructions: Return in about 2 months (around 07/10/2023) for ?RA on MTX f/u 2mos.   Lonni LELON Ester, MD  Note - This record has been created using [employer].  Chart creation errors have been sought, but may not always  have been located. Such creation errors do not reflect on  the standard of medical care.

## 2023-05-02 LAB — C1 ESTERASE INHIBITOR, FUNCTIONAL: C1INH Functional/C1INH Total MFr SerPl: >110 %{normal}

## 2023-05-02 LAB — COMPLEMENT COMPONENT C1Q: Complement C1Q: 14.2 mg/dL (ref 10.2–20.3)

## 2023-05-02 LAB — C4 COMPLEMENT: Complement C4, Serum: 19 mg/dL (ref 12–38)

## 2023-05-02 LAB — C1 ESTERASE INHIBITOR: C1INH SerPl-mCnc: 34 mg/dL (ref 21–39)

## 2023-05-08 ENCOUNTER — Ambulatory Visit (INDEPENDENT_AMBULATORY_CARE_PROVIDER_SITE_OTHER): Payer: BC Managed Care – PPO | Admitting: Family Medicine

## 2023-05-08 ENCOUNTER — Encounter: Payer: Self-pay | Admitting: Family Medicine

## 2023-05-08 VITALS — BP 122/72 | HR 74 | Temp 97.8°F | Ht 70.0 in | Wt 242.0 lb

## 2023-05-08 DIAGNOSIS — E782 Mixed hyperlipidemia: Secondary | ICD-10-CM | POA: Insufficient documentation

## 2023-05-08 DIAGNOSIS — M2550 Pain in unspecified joint: Secondary | ICD-10-CM | POA: Diagnosis not present

## 2023-05-08 DIAGNOSIS — Z0001 Encounter for general adult medical examination with abnormal findings: Secondary | ICD-10-CM | POA: Diagnosis not present

## 2023-05-08 DIAGNOSIS — Z7689 Persons encountering health services in other specified circumstances: Secondary | ICD-10-CM

## 2023-05-08 DIAGNOSIS — Z23 Encounter for immunization: Secondary | ICD-10-CM

## 2023-05-08 DIAGNOSIS — Z0000 Encounter for general adult medical examination without abnormal findings: Secondary | ICD-10-CM

## 2023-05-08 NOTE — Progress Notes (Signed)
 New Patient Office Visit  Subjective   Patient ID: Marco Reynolds, male    DOB: [birth year]  Age: 33 y.o. MRN: [id number]  CC:  Chief Complaint  Patient presents with   New Patient (Initial Visit)    Establish care / physical   HPI Marco Reynolds presents to establish care Patient was previously a patient of [facility]  He is established with Rheumatology for polyarthralgia, inflammatory arthritis  Has follow up on Monday. States that they are trying different as current regimens are not working.   HLD  Reports compliance with 5 mg of Crestor. Denies side effects.  States that he gets labs completed at work.  States that he is now in good range.  Reports that he used to take fish oil and multivitamin, but has stopped since he started having ankle issues.   Occupation: HR at [employer]  Marital status: no concern for STDs  Diet: states that he has not been balanced this past year.  States that he was previously doing Whole 30  Eats at home often.  Exercise: states that he was previously using peloton with weight lifting. States that since joint issues started he has not been as active.  Substance use: ETOH infrequent, 1-2 times per month. Denies smoking. States that he was previously dipping tobacco and quit recently ~3 months ago.  Last eye exam: due, 3-4 years ago. States that he has one scheduled for later this year. States that he sometimes has blurriness.  Last dental exam: [facility], UTD  Last colonoscopy: not due  Contraceptive: states that they are considering it, but would like to check with insurance  PSA: denies family history  Refills needed today: n/a  Other specialists seen: Rheumatology  Dermatology exam: interested. States that he has not completed this in several years.  Fasting today:  no  Immunizations needed: Flu Vaccine: would like it   Tdap Vaccine:  not due  - every 38yrs - (<3 lifetime doses or unknown): all wounds -- look up  need for Tetanus IG - (>=3 lifetime doses): clean/minor wound if >60yrs from previous; all other wounds if >61yrs from previous Zoster Vaccine: not due (those >50yo, once) Pneumonia Vaccine: no, not due  (those w/ risk factors) - (<29yr) Both: Immunocompromised, cochlear implant, CSF leak, asplenic, sickle cell, Chronic Renal Failure - (<50yr) PPSV-23 only: Heart dz, lung disease, DM, tobacco abuse, alcoholism, cirrhosis/liver disease. - (>72yr): PPSV13 then PPSV23 in 6-12mths;  - (>64yr): repeat PPSV23 once if pt received prior to 35yo and 62yrs have passed   Outpatient Encounter Medications as of 05/08/2023  Medication Sig   folic acid  (FOLVITE ) 1 MG tablet Take 1 tablet (1 mg total) by mouth daily.   methotrexate  (RHEUMATREX) 2.5 MG tablet Take 6 tablets (15 mg total) by mouth once a week.   rosuvastatin (CRESTOR) 5 MG tablet Take 5 mg by mouth daily.   venlafaxine  XR (EFFEXOR  XR) 37.5 MG 24 hr capsule Take 1 capsule (37.5 mg total) by mouth daily with breakfast.   [DISCONTINUED] azelastine (ASTELIN) 0.1 % nasal spray  (Patient not taking: Reported on 04/16/2023)   [DISCONTINUED] fexofenadine (ALLEGRA) 180 MG tablet  (Patient not taking: Reported on 04/16/2023)   [DISCONTINUED] fluticasone (FLONASE) 50 MCG/ACT nasal spray  (Patient not taking: Reported on 04/16/2023)   [DISCONTINUED] Multiple Vitamin (MULTIVITAMIN ADULT) TABS  (Patient not taking: Reported on 04/16/2023)   [DISCONTINUED] rosuvastatin (CRESTOR) 10 MG tablet Take 10 mg by mouth daily. (Patient not taking: Reported on  04/16/2023)   [DISCONTINUED] Vitamin D , Cholecalciferol, 25 MCG (1000 UT) TABS  (Patient not taking: Reported on 04/16/2023)   No facility-administered encounter medications on file as of 05/08/2023.    Past Medical History:  Diagnosis Date   Allergy     Angio-edema    Effusion of knee joint right 07/17/2018   History of hyperlipidemia     Past Surgical History:  Procedure Laterality Date   MENISCUS REPAIR  Right     Family History  Problem Relation Age of Onset   Allergic rhinitis Mother    Healthy Mother    Gestational diabetes Mother    Miscarriages / Stillbirths Mother    Allergic rhinitis Father    Healthy Father    Hyperlipidemia Father    Hypertension Father    Kidney disease Father    Gout Father    Gout Brother    Healthy Brother    Healthy Brother    Healthy Daughter    Healthy Son     Social History   Socioeconomic History   Marital status: Married    Spouse name: Not on file   Number of children: Not on file   Years of education: Not on file   Highest education level: Bachelor's degree (e.g., BA, AB, BS)  Occupational History   Not on file  Tobacco Use   Smoking status: Never    Passive exposure: Never   Smokeless tobacco: Current    Types: Snuff  Vaping Use   Vaping status: Never Used  Substance and Sexual Activity   Alcohol use: Yes   Drug use: Never   Sexual activity: Yes    Birth control/protection: None  Other Topics Concern   Not on file  Social History Narrative   Not on file   Social Drivers of Health   Financial Resource Strain: Low Risk  (04/15/2023)   Overall Financial Resource Strain (CARDIA)    Difficulty of Paying Living Expenses: Not hard at all  Food Insecurity: No Food Insecurity (04/15/2023)   Hunger Vital Sign    Worried About Running Out of Food in the Last Year: Never true    Ran Out of Food in the Last Year: Never true  Transportation Needs: No Transportation Needs (04/15/2023)   PRAPARE - Administrator, Civil Service (Medical): No    Lack of Transportation (Non-Medical): No  Physical Activity: Unknown (04/15/2023)   Exercise Vital Sign    Days of Exercise per Week: Patient declined    Minutes of Exercise per Session: Not on file  Stress: No Stress Concern Present (04/15/2023)   [employer] of Occupational Health - Occupational Stress Questionnaire    Feeling of Stress : Only a little  Social  Connections: Socially Integrated (04/15/2023)   Social Connection and Isolation Panel [NHANES]    Frequency of Communication with Friends and Family: More than three times a week    Frequency of Social Gatherings with Friends and Family: Once a week    Attends Religious Services: More than 4 times per year    Active Member of [employer] or Organizations: Yes    Attends Engineer, Structural: More than 4 times per year    Marital Status: Married  Catering Manager Violence: Not on file   ROS As per HPI   Objective   BP 122/72   Pulse 74   Temp 97.8 F (36.6 C)   Ht 5' 10 (1.778 m)   Wt 242 lb (109.8 kg)   SpO2  98%   BMI 34.72 kg/m   Physical Exam Constitutional:      General: He is awake. He is not in acute distress.    Appearance: Normal appearance. He is well-developed, well-groomed and normal weight. He is not ill-appearing, toxic-appearing or diaphoretic.     Interventions: He is not intubated. HENT:     Head:     Salivary Glands: Right salivary gland is not diffusely enlarged or tender. Left salivary gland is not diffusely enlarged or tender.     Right Ear: No laceration, drainage, swelling or tenderness. No middle ear effusion. There is no impacted cerumen. No foreign body. No mastoid tenderness. No PE tube. No hemotympanum. Tympanic membrane is not injected, scarred, perforated, erythematous, retracted or bulging.     Left Ear: No laceration, drainage, swelling or tenderness.  No middle ear effusion. There is no impacted cerumen. No foreign body. No mastoid tenderness. No PE tube. No hemotympanum. Tympanic membrane is not injected, scarred, perforated, erythematous, retracted or bulging.     Nose: No nasal deformity, septal deviation, signs of injury, laceration, nasal tenderness, mucosal edema, congestion or rhinorrhea.     Right Sinus: No maxillary sinus tenderness or frontal sinus tenderness.     Left Sinus: No maxillary sinus tenderness or frontal sinus tenderness.   Eyes:     General: Lids are normal.     Extraocular Movements:     Right eye: Normal extraocular motion.     Left eye: Normal extraocular motion.     Conjunctiva/sclera:     Right eye: Right conjunctiva is not injected. No chemosis, exudate or hemorrhage.    Left eye: Left conjunctiva is not injected. No chemosis, exudate or hemorrhage.    Pupils: Pupils are equal, round, and reactive to light. Pupils are equal.     Right eye: Pupil is round, reactive and not sluggish. No corneal abrasion.     Left eye: Pupil is round, reactive and not sluggish. No corneal abrasion.     Funduscopic exam:    Right eye: No hemorrhage or exudate. Red reflex present.        Left eye: No hemorrhage or exudate. Red reflex present. Neck:     Thyroid : No thyroid  mass or thyromegaly.     Vascular: No carotid bruit.     Trachea: Trachea and phonation normal. No tracheal tenderness, tracheostomy or tracheal deviation.  Cardiovascular:     Rate and Rhythm: Normal rate and regular rhythm.     Pulses:          Radial pulses are 2+ on the right side and 2+ on the left side.     Heart sounds: Normal heart sounds.  Pulmonary:     Effort: Pulmonary effort is normal. No tachypnea, bradypnea, accessory muscle usage, prolonged expiration, respiratory distress or retractions. He is not intubated.     Breath sounds: Normal breath sounds. No stridor, decreased air movement or transmitted upper airway sounds. No decreased breath sounds, wheezing, rhonchi or rales.  Abdominal:     General: Abdomen is flat. Bowel sounds are normal. There is no distension or abdominal bruit. There are no signs of injury.     Palpations: Abdomen is soft. There is no shifting dullness, fluid wave, hepatomegaly, splenomegaly, mass or pulsatile mass.     Tenderness: There is no abdominal tenderness. There is no right CVA tenderness, left CVA tenderness, guarding or rebound.     Hernia: No hernia is present.  Musculoskeletal:     Cervical back:  Full passive range of motion without pain, normal range of motion and neck supple. No edema, erythema, rigidity, torticollis or crepitus. No pain with movement. Normal range of motion.     Right lower leg: No edema.     Left lower leg: No edema.  Lymphadenopathy:     Head:     Right side of head: No submental, submandibular, tonsillar, preauricular or posterior auricular adenopathy.     Left side of head: No submental, submandibular, tonsillar, preauricular or posterior auricular adenopathy.     Cervical: No cervical adenopathy.     Right cervical: No superficial, deep or posterior cervical adenopathy.    Left cervical: No superficial, deep or posterior cervical adenopathy.  Skin:    General: Skin is warm.     Capillary Refill: Capillary refill takes less than 2 seconds.  Neurological:     General: No focal deficit present.     Mental Status: He is alert, oriented to person, place, and time and easily aroused. Mental status is at baseline.     Cranial Nerves: Cranial nerves 2-12 are intact. No cranial nerve deficit or facial asymmetry.     Motor: Motor function is intact.     Gait: Gait is intact.  Psychiatric:        Attention and Perception: Attention and perception normal.        Mood and Affect: Mood and affect normal.        Speech: Speech normal.        Behavior: Behavior normal. Behavior is cooperative.        Thought Content: Thought content normal.        Cognition and Memory: Cognition and memory normal.        Judgment: Judgment normal.       05/08/2023    8:16 AM 09/27/2022    1:03 PM 08/09/2022    9:29 AM  Depression screen PHQ 2/9  Decreased Interest 0 1 0  Down, Depressed, Hopeless 0 1 0  PHQ - 2 Score 0 2 0  Altered sleeping 0 1 0  Tired, decreased energy 1 1 1   Change in appetite 1 1 1   Feeling bad or failure about yourself  0 1 0  Trouble concentrating 0 1 1  Moving slowly or fidgety/restless 0 0 0  Suicidal thoughts 0 0 0  PHQ-9 Score 2 7 3   Difficult  doing work/chores Not difficult at all Somewhat difficult Not difficult at all      05/08/2023    8:17 AM 09/27/2022    1:04 PM 08/09/2022    9:30 AM 11/06/2020    8:38 AM  GAD 7 : Generalized Anxiety Score  Nervous, Anxious, on Edge 0 1 0 0  Control/stop worrying 0 1 0 0  Worry too much - different things 1 1 1  0  Trouble relaxing 0 1 1 0  Restless 1 1 0 0  Easily annoyed or irritable 0 1 1 0  Afraid - awful might happen 0 0 0 0  Total GAD 7 Score 2 6 3  0  Anxiety Difficulty Not difficult at all Somewhat difficult Not difficult at all    Assessment & Plan:  1. Encounter for general adult medical examination without abnormal findings (Primary) Discussed with patient to continue healthy lifestyle choices, including diet (rich in fruits, vegetables, and lean proteins, and low in salt and simple carbohydrates) and exercise (at least 30 minutes of moderate physical activity daily). Limit beverages high is sugar. Recommended at  least 80-100 oz of water daily.   2. Encounter to establish care As above.   3. Polyarthralgia, inflammatory arthritis Established with Rheumatology. Reviewed notes from [facility], MD from 04/25/23. Patient to continue to follow up with specialty.   4. Mixed hyperlipidemia Patient to bring in labs from work. Will continue current regimen.   5. Encounter for immunization - Flu vaccine trivalent PF, 6mos and older(Flulaval,Afluria,Fluarix,Fluzone)  The above assessment and management plan was discussed with the patient. The patient verbalized understanding of and has agreed to the management plan using shared-decision making. Patient is aware to call the clinic if they develop any new symptoms or if symptoms fail to improve or worsen. Patient is aware when to return to the clinic for a follow-up visit. Patient educated on when it is appropriate to go to the emergency department.   Return if symptoms worsen or fail to improve.   Marry Kins, DNP-FNP [facility] [address] [place], [place] [address] [phone]

## 2023-05-12 ENCOUNTER — Encounter: Payer: Self-pay | Admitting: Internal Medicine

## 2023-05-12 ENCOUNTER — Ambulatory Visit: Payer: BC Managed Care – PPO | Attending: Internal Medicine | Admitting: Internal Medicine

## 2023-05-12 VITALS — BP 151/93 | HR 84 | Resp 14 | Ht 70.0 in | Wt 241.0 lb

## 2023-05-12 DIAGNOSIS — M778 Other enthesopathies, not elsewhere classified: Secondary | ICD-10-CM | POA: Diagnosis not present

## 2023-05-12 DIAGNOSIS — Z8739 Personal history of other diseases of the musculoskeletal system and connective tissue: Secondary | ICD-10-CM | POA: Diagnosis not present

## 2023-05-12 DIAGNOSIS — M2550 Pain in unspecified joint: Secondary | ICD-10-CM | POA: Diagnosis not present

## 2023-05-12 DIAGNOSIS — Z79899 Other long term (current) drug therapy: Secondary | ICD-10-CM

## 2023-05-12 DIAGNOSIS — M0600 Rheumatoid arthritis without rheumatoid factor, unspecified site: Secondary | ICD-10-CM

## 2023-05-12 MED ORDER — FOLIC ACID 1 MG PO TABS: 1.0000 mg | ORAL_TABLET | Freq: Every day | ORAL | 0 refills | Status: DC

## 2023-05-12 MED ORDER — METHOTREXATE SODIUM 2.5 MG PO TABS: 25.0000 mg | ORAL_TABLET | ORAL | 2 refills | Status: DC

## 2023-05-12 NOTE — Patient Instructions (Addendum)
 I will try and arrange for a new imaging study called Dual Energy Computed Tomography (DECT) that is a CT scan of the joint that can show high resolution of the bones and also visualize any crystals involved in joint inflammation.  I recommend to try increasing the methotrexate  to 25 mg weekly (10 tablets) and monitor if this has any more difference on symptoms.  Hopefully we will have more information to follow up soon. If we are seeing any more benefit with the medication we would need to recheck your labs after 1-2 months at the higher dose.

## 2023-05-19 ENCOUNTER — Telehealth: Payer: Self-pay | Admitting: Internal Medicine

## 2023-05-19 NOTE — Telephone Encounter (Signed)
Pt called stating [facility] called to schedule his CT. Pt wanted to make sure this was the correct office that performs the CT that he needs. Pt did not want to spend money on the incorrect scan. Please call pt back to confirm at [phone].

## 2023-05-19 NOTE — Telephone Encounter (Signed)
Contacted the patient and advised the referral for the CT was sent to [facility] outpatient. Advised the patient he is supposed to have a Dual Energy Computed Tomography (DECT) of the right ankle. Patient verbalized understanding. Patient states she will give them a call back and schedule the CT.

## 2023-05-20 ENCOUNTER — Telehealth: Payer: Self-pay | Admitting: Internal Medicine

## 2023-05-20 NOTE — Telephone Encounter (Signed)
Pt called stating Marco Reynolds called stating they would need a re faxed referral for the CT scan to [facility] on the hospital site at [phone]

## 2023-05-21 NOTE — Telephone Encounter (Signed)
Referral faxed, pending appt ?

## 2023-05-26 ENCOUNTER — Encounter: Payer: Self-pay | Admitting: Internal Medicine

## 2023-05-26 NOTE — Telephone Encounter (Signed)
LMOM CT denied, pending appeal

## 2023-05-30 DIAGNOSIS — M0600 Rheumatoid arthritis without rheumatoid factor, unspecified site: Secondary | ICD-10-CM | POA: Diagnosis not present

## 2023-05-30 DIAGNOSIS — R600 Localized edema: Secondary | ICD-10-CM | POA: Diagnosis not present

## 2023-05-30 DIAGNOSIS — M25471 Effusion, right ankle: Secondary | ICD-10-CM | POA: Diagnosis not present

## 2023-05-30 DIAGNOSIS — M06071 Rheumatoid arthritis without rheumatoid factor, right ankle and foot: Secondary | ICD-10-CM | POA: Diagnosis not present

## 2023-06-08 ENCOUNTER — Other Ambulatory Visit: Payer: Self-pay | Admitting: Internal Medicine

## 2023-06-08 DIAGNOSIS — M2550 Pain in unspecified joint: Secondary | ICD-10-CM

## 2023-06-25 LAB — LAB REPORT - SCANNED
A1c: 5.9
EGFR: 115

## 2023-07-01 NOTE — Progress Notes (Signed)
 Office Visit Note  Patient: Marco Reynolds             Date of Birth: Mar 09, 1991           MRN: [id number]             PCP: Arrie Senate, FNP Referring: Arrie Senate* Visit Date: 07/15/2023   Subjective:  Follow-up (Patient states he wants to talk about the methotrexate. Patient states there is a new pain going from his ankle to his calf. )   Discussed the use of AI scribe software for clinical note transcription with the patient, who gave verbal consent to proceed.  History of Present Illness   Marco Reynolds is a 33 y.o. male here for follow up for right ankle pain and left toe pain with persistent joint swelling now just starting methotrexate 25 mg PO weekly and folic acid 1 mg daily.    He has persistent pain and swelling in his right ankle, which has been unstable since early to mid last year. The pain is described as a 'really bad ache' that persists regardless of activity. Recently, he experienced shooting pain up his calf when pressure was applied to a specific area on the ankle, leading to increased soreness in the calf. The pain radiates from the ankle into the arch of his foot and up the calf.  Previous treatments, including multiple injections and methotrexate, have not significantly improved his symptoms. Methotrexate, even when split into two doses, caused severe headaches without noticeable improvement in his condition. A recent dual energy CT scan showed no evidence of gout or other crystal-related arthritis.  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  He mentions occasional soreness in his wrist and sciatic-type pain, but these are not as concerning as the ankle pain. No significant back pain. His left second toe remains stiff and somewhat swollen, described as 'sausage-like', and his right ankle is notably swollen on the lateral  side. No pain in the Achilles tendon and the front of the ankle.   Recent labs were checked last month and reviewed:  06/24/23 ESR 23 Vit D 20.8 Lipids TChol 171 HDL 35 CBC wnl CMP wnl  Previous HPI 05/14/2023 Marco Reynolds is a 33 y.o. male here for follow up for right ankle pain and left toe pain with persistent joint swelling now just starting methotrexate 15 mg PO weekly and folic acid 1 mg daily. He has been on methotrexate for over two months, supplemented with folic acid. They report no side effects from the medication, but also no noticeable improvement in their condition. The patient's blood work, conducted during a follow-up visit, appeared normal. They also underwent allergy testing, which revealed sensitivities to cats, dogs, and certain trees. Resuming allergy medication has improved their overall well-being, but has not impacted their joint pain.   The patient's pain is primarily located in their ankle, described as a throbbing sensation that intensifies with movement and can disrupt sleep. The pain typically subsides slightly after a few days of rest. The patient has tried to increase their physical activity, but this has resulted in increased pain. They have not been on any other steroids since their last visit, although they did receive a steroid shot in their left second toe several months ago.   The patient has also noticed a change in their gait due to the pain, walking  primarily on their heel to avoid putting weight on the affected ankle. This has led to additional discomfort in their knee, hip, and back. The patient has not observed any visible swelling or redness in the ankle, but reports that it feels "puffy" compared to the other ankle.   The patient has previously tried multiple treatments for their condition, including three steroid shots in the right ankle, which did not provide any relief. They express a desire to ensure the appropriateness of any further  treatment escalation, given the lack of improvement with current therapies.       Previous HPI 03/10/2023 Marco Reynolds is a 33 y.o. male here for follow up for right ankle pain and left toe pain with persistent joint swelling now just starting methotrexate 15 mg PO weekly and folic acid 1 mg daily.  He was recommended to switch after visit with Dr. Benjamin Stain in sports medicine clinic with persistent ankle swelling despite continuing sulfasalazine treatment.  Seems like the dosing instructions were unclear he started taking this medicine 1 tablet daily over the weekend after stopping sulfasalazine.  He thinks this may be causing headaches otherwise has not noticed any difference.  Besides that in the interval he had a steroid injection in the left great toe for pain and swelling and this was very effective unlike the 3 injections he has tried in the right ankle.   Previous HPI 01/07/2023 Marco Reynolds is a 33 y.o. male here for follow up for right ankle pain and left toe pain with persistent joint swelling after trial of starting sulfasalazine 1000 mg twice daily.  Lab test at initial visit negative for any serologic marker antibody markers and negative for HLA-B27 but showing persistent elevated CRP and sedimentation rate.  He started the sulfasalazine 500 mg twice daily for 1 week then 1000 mg twice daily now at about 1 month duration so far has not noticed any appreciable change in symptoms.  He followed up with Dr. Katrinka Blazing started on allopurinol which he is tolerating.  Was also started on venlafaxine initially 37.5 mg and increase to 75 mg daily.  Despite all this still has symptoms pain is more with use and weightbearing versus direct pressure on the affected areas.  Sometimes gets a catch sensation in the right ankle that extends onto the medial side and bottom of the arch.  Persistent swelling at the left second toe is now worse at the base of the toe and less distally.   Previous  HPI 11/25/22  Marco Reynolds is a 33 y.o. male here for evaluation of joint pain and swelling mostly in the right ankle but few other joints also involved.  This started since March 16 he woke up with acute onsets of significant pain and swelling.  Tried taking oral anti-inflammatory medications with limited benefit.  Saw Dr. Katrinka Blazing had imaging trial of several steroid injections for concern of ankle inflammation or tendinopathy.  Went for MRI of the ankle in April demonstrated some chronic tendon changes also tibiotalar joint effusion.  In total to lateral and 1 medial steroid injection these were somewhat helpful and lateral ankle pain did improve but incomplete benefit.  During this time was also treated with a round of medication for bronchitis that resolved but did not see relief in joint pain with resolution.  More recently also started to have visible swelling and pain at the base of second and third toes on the left foot and a feeling like a nodule or  mass pressing on the bottom of the foot while weightbearing.  Currently taking meloxicam 15 mg daily instead of over-the-counter NSAIDs which is partially controlling symptoms but still having a lot of activity and mobility limitations. Prior to onset of the symptoms he did not recall any new injury or change in activity.  He was sick with uncomplicated course of COVID in January. He had a previous history of right knee and left ankle pain and effusions in 2020 found to have pseudogout confirmed on knee aspirate with intracellular calcium pyrophosphate crystals.  Also around that time did have 1 small kidney stone.  Also with hypercalcemia and hyperparathyroidism these are thought incidental to hide baseline serum protein level and mild vitamin D insufficiency that resolved.   Review of Systems  Constitutional:  Negative for fatigue.  HENT:  Negative for mouth sores and mouth dryness.   Eyes:  Negative for dryness.  Respiratory:  Negative for  shortness of breath.   Cardiovascular:  Negative for chest pain and palpitations.  Gastrointestinal:  Negative for blood in stool, constipation and diarrhea.  Endocrine: Negative for increased urination.  Genitourinary:  Negative for involuntary urination.  Musculoskeletal:  Positive for joint pain, joint pain, joint swelling, morning stiffness and muscle tenderness. Negative for gait problem, myalgias, muscle weakness and myalgias.  Skin:  Negative for color change, rash, hair loss and sensitivity to sunlight.  Allergic/Immunologic: Negative for susceptible to infections.  Neurological:  Negative for dizziness and headaches.  Hematological:  Negative for swollen glands.  Psychiatric/Behavioral:  Negative for depressed mood and sleep disturbance. The patient is not nervous/anxious.     PMFS History:  Patient Active Problem List   Diagnosis Date Noted   Prediabetes 07/08/2023   Vitamin D deficiency 07/08/2023   Mixed hyperlipidemia 05/08/2023   Pseudogout 04/16/2023   Polyarthralgia, inflammatory arthritis 03/04/2023   History of pseudogout 01/07/2023   Polyarthritis 11/25/2022   High risk medication use 11/25/2022   Tenosynovitis of tibialis posterior tendon 10/11/2022   Peroneal tendon tear, right, initial encounter 08/30/2022   Ankle effusion, right 07/31/2022   Hypercalcemia 02/04/2020   Epididymitis, left 01/28/2020   Left ankle pain 07/17/2018   Obesity (BMI 30.0-34.9) 04/01/2018   Deviated nasal septum 07/21/2017   Nasal turbinate hypertrophy 07/21/2017   Seasonal allergic rhinitis due to pollen 12/16/2016    Past Medical History:  Diagnosis Date   Allergy    Angio-edema    Effusion of knee joint right 07/17/2018   History of hyperlipidemia     Family History  Problem Relation Age of Onset   Allergic rhinitis Mother    Healthy Mother    Gestational diabetes Mother    Miscarriages / [place] Mother    Allergic rhinitis Father    Healthy Father     Hyperlipidemia Father    Hypertension Father    Kidney disease Father    Gout Father    Gout Brother    Healthy Brother    Healthy Brother    Healthy Daughter    Healthy Son    Past Surgical History:  Procedure Laterality Date   MENISCUS REPAIR Right    Social History   Social History Narrative   Not on file   Immunization History  Administered Date(s) Administered   DTaP 03/13/1989, 05/13/1989, 07/08/1992, 07/23/1992, 03/11/1994   HIB (PRP-OMP) 03/13/1989, 05/13/1989, 07/08/1989, 05/07/1990   Hepatitis A, Adult 09/09/2017   Hepatitis B 01/29/2001, 03/05/2001, 07/16/2001   Hepatitis B, ADULT 09/18/2017, 11/04/2017, 01/08/2018   IPV 03/13/1989, 05/13/1989,  07/23/1989, 03/11/1994   Influenza Inj Mdck Quad Pf 02/11/2022   Influenza, Seasonal, Injecte, Preservative Fre 05/08/2023   Influenza,inj,Quad PF,6+ Mos 02/13/2017, 02/09/2019   Influenza-Unspecified 02/06/2016, 01/27/2018, 02/07/2020, 02/11/2022   MMR 05/07/1990, 03/11/1994   Meningococcal Mcv4o 09/09/2017   Moderna Sars-Covid-2 Vaccination 07/01/2019, 08/03/2019, 03/02/2020   Tdap 10/24/2006, 07/14/2014     Objective: Vital Signs: BP (!) 146/103 (BP Location: Left Arm, Patient Position: Sitting, Cuff Size: Large)   Pulse 83   Resp 14   Ht 5\' 10"  (1.778 m)   Wt 246 lb (111.6 kg)   BMI 35.30 kg/m    Physical Exam Eyes:     Conjunctiva/sclera: Conjunctivae normal.  Cardiovascular:     Rate and Rhythm: Normal rate and regular rhythm.  Pulmonary:     Effort: Pulmonary effort is normal.     Breath sounds: Normal breath sounds.  Musculoskeletal:     Right lower leg: No edema.     Left lower leg: No edema.  Lymphadenopathy:     Cervical: No cervical adenopathy.  Skin:    General: Skin is warm and dry.     Findings: No rash.  Neurological:     Mental Status: He is alert.  Psychiatric:        Mood and Affect: Mood normal.      Musculoskeletal Exam:  Wrists full ROM no tenderness or swelling Fingers  full ROM no tenderness or swelling Knees full ROM no tenderness or swelling Right ankle swollen, especially on the lateral side, mild tenderness with direct pressure along posterior and inferior border of lateral malleolus, pain increased with ankle inversion Mild diffuse thickening of left 2nd toe, without any tenderness to pressure    Investigation: No additional findings.  Imaging: No results found.  Recent Labs: Lab Results  Component Value Date   WBC 6.6 04/15/2023   HGB 14.8 04/15/2023   PLT 302 04/15/2023   NA 140 04/15/2023   K 4.5 04/15/2023   CL 100 04/15/2023   CO2 23 04/15/2023   GLUCOSE 87 04/15/2023   BUN 10 04/15/2023   CREATININE 0.97 04/15/2023   BILITOT 0.4 04/15/2023   ALKPHOS 85 04/15/2023   AST 21 04/15/2023   ALT 28 04/15/2023   PROT 7.5 04/15/2023   ALBUMIN 5.0 04/15/2023   CALCIUM 9.9 04/15/2023   GFRAA 101 04/01/2018    Speciality Comments: No specialty comments available.  Procedures:  No procedures performed Allergies: Patient has no known allergies.   Assessment / Plan:     Visit Diagnoses: Seronegative inflammatory arthritis Capsulitis of left foot Persistent ankle pain and instability with calf pain. Imaging ruled out crystal-induced arthritis. Non-responsive to methotrexate with side effects. Suspected seronegative inflammatory arthritis, possibly psoriatic arthritis, rheumatoid arthritis, or spondyloarthritis. Biologic therapy considered due to methotrexate failure lack of control with other oral medications and numerous injections, and high risk for ongoing just damage. TNF inhibitors like adalimumab recommended for broad efficacy and tolerability. . - Discontinue methotrexate. - Will plan to initiate adalimumab with first dose in office under supervision, pending most recent imaging study report  High risk medication use - Plan: QuantiFERON-TB Gold Plus Recent baseline labs normal. Previous negative hepatitis screening 2021. No  personal history of cancer, CHF, recurrent infections. - Discussed adalimumab side effects, including infection risk and malignancy risk - Checking baseline quantiferon screening   Orders: Orders Placed This Encounter  Procedures   QuantiFERON-TB Gold Plus   No orders of the defined types were placed in this encounter.  Follow-Up Instructions: Return in about 4 months (around 11/14/2023) for ?ADA start f/u 3-98mos.   Fuller Plan, MD  Note - This record has been created using [employer].  Chart creation errors have been sought, but may not always  have been located. Such creation errors do not reflect on  the standard of medical care.

## 2023-07-05 ENCOUNTER — Encounter: Payer: Self-pay | Admitting: Family Medicine

## 2023-07-08 ENCOUNTER — Encounter: Payer: Self-pay | Admitting: Family Medicine

## 2023-07-08 ENCOUNTER — Other Ambulatory Visit: Payer: Self-pay

## 2023-07-08 DIAGNOSIS — M25571 Pain in right ankle and joints of right foot: Secondary | ICD-10-CM

## 2023-07-08 DIAGNOSIS — R7303 Prediabetes: Secondary | ICD-10-CM | POA: Insufficient documentation

## 2023-07-08 DIAGNOSIS — E559 Vitamin D deficiency, unspecified: Secondary | ICD-10-CM | POA: Insufficient documentation

## 2023-07-08 NOTE — Progress Notes (Signed)
 Prediabetic. Is patient following with other provider to manage?

## 2023-07-10 ENCOUNTER — Encounter: Payer: Self-pay | Admitting: Family Medicine

## 2023-07-14 ENCOUNTER — Ambulatory Visit
Admission: RE | Admit: 2023-07-14 | Discharge: 2023-07-14 | Disposition: A | Discharge status: Home / Self Care | Source: Ambulatory Visit | Attending: Family Medicine | Admitting: Family Medicine

## 2023-07-14 ENCOUNTER — Ambulatory Visit: Payer: BC Managed Care – PPO | Admitting: Internal Medicine

## 2023-07-14 DIAGNOSIS — M25571 Pain in right ankle and joints of right foot: Secondary | ICD-10-CM | POA: Diagnosis not present

## 2023-07-14 DIAGNOSIS — M76821 Posterior tibial tendinitis, right leg: Secondary | ICD-10-CM | POA: Diagnosis not present

## 2023-07-14 DIAGNOSIS — R600 Localized edema: Secondary | ICD-10-CM | POA: Diagnosis not present

## 2023-07-15 ENCOUNTER — Encounter: Payer: Self-pay | Admitting: Internal Medicine

## 2023-07-15 ENCOUNTER — Ambulatory Visit: Payer: BC Managed Care – PPO | Attending: Internal Medicine | Admitting: Internal Medicine

## 2023-07-15 VITALS — BP 146/103 | HR 83 | Resp 14 | Ht 70.0 in | Wt 246.0 lb

## 2023-07-15 DIAGNOSIS — Z79899 Other long term (current) drug therapy: Secondary | ICD-10-CM | POA: Diagnosis not present

## 2023-07-15 DIAGNOSIS — Z8739 Personal history of other diseases of the musculoskeletal system and connective tissue: Secondary | ICD-10-CM

## 2023-07-15 DIAGNOSIS — M1380 Other specified arthritis, unspecified site: Secondary | ICD-10-CM | POA: Diagnosis not present

## 2023-07-15 DIAGNOSIS — M778 Other enthesopathies, not elsewhere classified: Secondary | ICD-10-CM | POA: Diagnosis not present

## 2023-07-15 NOTE — Patient Instructions (Signed)
 Adalimumab Injection What is this medication? ADALIMUMAB (ay da LIM yoo mab) treats autoimmune conditions, such as psoriasis, arthritis, Crohn disease, and ulcerative colitis. It works by slowing down an overactive immune system.  It may also be used to treat hidradenitis suppurativa (HS). HS is a condition that causes painful lumps under the skin in areas such as the armpits and groin. It belongs to a group of medications called TNF inhibitors. It is a monoclonal antibody. This medicine may be used for other purposes; ask your health care provider or pharmacist if you have questions. COMMON BRAND NAME(S): ABRILADA, AMJEVITA, CYLTEZO, HADLIMA, Hulio, Hulio PEN, Humira, HUMIRA PEN, Hyrimoz, Idacio, Simlandi, Yuflyma, YUSIMRY What should I tell my care team before I take this medication? They need to know if you have any of these conditions: Cancer Diabetes (high blood sugar) Having surgery Heart disease Hepatitis B Immune system problems Infections, such as tuberculosis (TB) or other bacterial, fungal, or viral infections Multiple sclerosis Recent or upcoming vaccine An unusual or allergic reaction to adalimumab, mannitol, latex, rubber, other medications, foods, dyes, or preservatives Pregnant or trying to get pregnant Breast-feeding How should I use this medication? This medication is injected under the skin. It may be given by your care team in a hospital or clinic setting. It may also be given at home. If you get this medication at home, you will be taught how to prepare and give it. Use exactly as directed. Take it as directed on the prescription label. Keep taking it unless your care team tells you to stop. This medication comes with INSTRUCTIONS FOR USE. Ask your pharmacist for directions on how to use this medication. Read the information carefully. Talk to your pharmacist or care team if you have questions. It is important that you put your used needles and syringes in a special sharps  container. Do not put them in a trash can. If you do not have a sharps container, call your pharmacist or care team to get one. A special MedGuide will be given to you by the pharmacist with each prescription and refill. If you are getting this medication in a hospital or clinic, a special MedGuide will be given to you before each treatment. Be sure to read this information carefully each time. Talk to your care team about the use of this medication in children. While it be prescribed for children as young as 2 years for selected conditions, precautions do apply. Overdosage: If you think you have taken too much of this medicine contact a poison control center or emergency room at once. NOTE: This medicine is only for you. Do not share this medicine with others. What if I miss a dose? If you get this medication at the hospital or clinic: it is important not to miss your dose. Call your care team if you are unable to keep an appointment. If you give yourself this medication at home: If you miss a dose, take it as soon as you can. If it is almost time for your next dose, take only that dose. Do not take double or extra doses. Call your care team with questions. What may interact with this medication? Do not take this medication with any of the following: Abatacept Anakinra Biologic medications, such as certolizumab, etanercept, golimumab, infliximab Live virus vaccines This medication may also interact with the following: Cyclosporine Theophylline Vaccines Warfarin This list may not describe all possible interactions. Give your health care provider a list of all the medicines, herbs, non-prescription drugs,  or dietary supplements you use. Also tell them if you smoke, drink alcohol, or use illegal drugs. Some items may interact with your medicine. What should I watch for while using this medication? Visit your care team for regular checks on your progress. Tell your care team if your symptoms do not  start to get better or if they get worse. You will be tested for tuberculosis (TB) before you start this medication. If your care team prescribes any medication for TB, you should start taking the TB medication before starting this medication. Make sure to finish the full course of TB medication. This medication may increase your risk of getting an infection. Call your care team for advice if you get a fever, chills, sore throat, or other symptoms of a cold or flu. Do not treat yourself. Try to avoid being around people who are sick. Talk to your care team about your risk of cancer. You may be more at risk for certain types of cancer if you take this medication. What side effects may I notice from receiving this medication? Side effects that you should report to your care team as soon as possible: Allergic reactions--skin rash, itching, hives, swelling of the face, lips, tongue, or throat Aplastic anemia--unusual weakness or fatigue, dizziness, headache, trouble breathing, increased bleeding or bruising Body pain, tingling, or numbness Heart failure--shortness of breath, swelling of the ankles, feet, or hands, sudden weight gain, unusual weakness or fatigue Infection--fever, chills, cough, sore throat, wounds that don't heal, pain or trouble when passing urine, general feeling of discomfort or being unwell Lupus-like syndrome--joint pain, swelling, or stiffness, butterfly-shaped rash on the face, rashes that get worse in the sun, fever, unusual weakness or fatigue Unusual bruising or bleeding Side effects that usually do not require medical attention (report to your care team if they continue or are bothersome): Headache Nausea Pain, redness, or irritation at injection site Runny or stuffy nose Sore throat Stomach pain This list may not describe all possible side effects. Call your doctor for medical advice about side effects. You may report side effects to FDA at 1-800-FDA-1088. Where should I  keep my medication? Keep out of the reach of children and pets. Store in the refrigerator. Do not freeze. Keep this medication in the original packaging until you are ready to take it. Protect from light. Get rid of any unused medication after the expiration date. This medication may be stored at room temperature for up to 14 days. Keep this medication in the original packaging. Protect from light. If it is stored at room temperature, get rid of any unused medication after 14 days or after it expires, whichever is first. To get rid of medications that are no longer needed or have expired: Take the medication to a medication take-back program. Check with your pharmacy or law enforcement to find a location. If you cannot return the medication, ask your pharmacist or care team how to get rid of this medication safely. NOTE: This sheet is a summary. It may not cover all possible information. If you have questions about this medicine, talk to your doctor, pharmacist, or health care provider.  2024 Elsevier/Gold Standard (2023-03-28 00:00:00)

## 2023-07-18 ENCOUNTER — Encounter: Payer: Self-pay | Admitting: Family Medicine

## 2023-07-18 LAB — QUANTIFERON-TB GOLD PLUS
Mitogen-NIL: >10 [IU]/mL
NIL: 0.04 [IU]/mL
QuantiFERON-TB Gold Plus: NEGATIVE
TB1-NIL: 0 [IU]/mL
TB2-NIL: 0.04 [IU]/mL

## 2023-07-22 DIAGNOSIS — H40013 Open angle with borderline findings, low risk, bilateral: Secondary | ICD-10-CM | POA: Diagnosis not present

## 2023-07-23 ENCOUNTER — Ambulatory Visit: Payer: BC Managed Care – PPO | Admitting: Family Medicine

## 2023-07-25 ENCOUNTER — Telehealth: Payer: Self-pay | Admitting: Pharmacist

## 2023-07-25 NOTE — Telephone Encounter (Addendum)
 D/w with Dr. Dimple Casey - appears that sports med was recommending orthopedic workup. Will hold off on adalimumab benefits investigation until we receive an update to move forward on this  Chesley Mires, PharmD, MPH, BCPS, CPP Clinical Pharmacist (Rheumatology and Pulmonology)  ----- Message from [facility] sent at 07/15/2023  3:50 PM EDT ----- Regarding: Humira New Start Pending MRI Results May start Humira pending most recent MRI results.

## 2023-07-28 ENCOUNTER — Encounter: Payer: Self-pay | Admitting: Internal Medicine

## 2023-07-28 ENCOUNTER — Telehealth: Payer: Self-pay | Admitting: Pharmacist

## 2023-07-28 NOTE — Telephone Encounter (Signed)
 Submitted a Prior Authorization request to [facility] for  AMJEVITA  via CoverMyMeds. Will update once we receive a response.  Key: [id number]

## 2023-07-28 NOTE — Telephone Encounter (Addendum)
 Submitted a Prior Authorization request to [facility] for HUMIRA via CoverMyMeds. Will update once we receive a response.  KeyFelicity Pellegrini   ----- Message from Fuller Plan sent at 07/28/2023 11:47 AM EDT ----- We can go ahead with adalimumab BIV for seronegative rheumatoid arthritis for Mr. Heffern. Thanks.

## 2023-08-01 ENCOUNTER — Other Ambulatory Visit (HOSPITAL_COMMUNITY): Payer: Self-pay

## 2023-08-01 NOTE — Telephone Encounter (Signed)
 Received notification from [facility] regarding a prior authorization for  AMJEVITA . Authorization has been APPROVED from 07/28/2023 to 01/27/2024. Approval letter sent to scan center.  Unable to run test claim because patient must fill through [facility]: [phone]   Authorization # [id number]  Enrolled patient into Amjevita copay card: BIN: [id number] PCN: [id number] Group: [id number] ID: [id number]  We have one Amjevita sample in clinic. ATC patient to schedule Amjevita appt. Unable to reach. Left VM and MyChart message sent  Chesley Mires, PharmD, MPH, BCPS, CPP Clinical Pharmacist (Rheumatology and Pulmonology)

## 2023-08-06 NOTE — Telephone Encounter (Signed)
 ATC # 2 to schedule Amjevita new start. Unable to reach. IF patient returns call - please schedule on any available pharmacy slot that works for him since we have sample. Thank you

## 2023-08-07 NOTE — Telephone Encounter (Signed)
 ATC # 3 to schedule Amjevita new start. Unable to reach. Left VM requesting return call if interested in starting. Letter sent. Will close follow-up if no response by 08/25/2023  IF patient returns call - please schedule on any available pharmacy slot that works for him since we have sample. Thank you   Chesley Mires, PharmD, MPH, BCPS, CPP Clinical Pharmacist (Rheumatology and Pulmonology)

## 2023-08-11 NOTE — Patient Instructions (Signed)
 Your next  AMJEVITA  dose is due on 08/27/2023, 09/11/2023, and every 14 days thereafter  HOLD AMJEVITA if you have signs or symptoms of an infection. You can resume once you feel better or back to your baseline. HOLD AMJEVITA if you start antibiotics to treat an infection. HOLD AMJEVITA around the time of surgery/procedures. Your surgeon will be able to provide recommendations on when to hold BEFORE and when you are cleared to RESUME.  Pharmacy information: Your prescription will be shipped from [facility] Their phone number is [phone] Please call to schedule shipment and confirm address. They will mail your medication to your home.  Cost information: Your copay should be affordable. If you call the pharmacy and it is not affordable, please double-check that they are billing through your copay card as secondary coverage. That copay card information is: BIN: [id number] PCN: [id number] Group: [id number] ID: [id number]  Labs are due in 1 month then every 3 months. Lab hours are from Monday to Thursday 8am-12:30pm and 1pm-5pm and Friday 8am-12pm. You do not need an appointment if you come for labs during these times. If you'd like to go to a [facility] or [facility] closer to home, please call our clinic 48 hours prior to lab date so we can release orders in a timely manner.  Stay up to date on all routine vaccines: influenza, pneumonia, COVID19, Shingles  How to manage an injection site reaction: Remember the 5 C's: COUNTER - leave on the counter at least 30 minutes but up to overnight to bring medication to room temperature. This may help prevent stinging COLD - place something cold (like an ice gel pack or cold water bottle) on the injection site just before cleansing with alcohol. This may help reduce pain CLARITIN - use Claritin (generic name is loratadine) for the first two weeks of treatment or the day of, the day before, and the day after injecting. This will help to minimize  injection site reactions CORTISONE CREAM - apply if injection site is irritated and itching CALL ME - if injection site reaction is bigger than the size of your fist, looks infected, blisters, or if you develop hives

## 2023-08-11 NOTE — Progress Notes (Unsigned)
 Pharmacy Note  Subjective:   Patient presents to clinic today to receive first dose of Amjevita for seronegative rheumatoid arthritis. Methotrexate was inefficacious and thus discontinued at [facility] on 07/15/23.  Patient running a fever or have signs/symptoms of infection? No  Patient currently on antibiotics for the treatment of infection? No  Patient have any upcoming invasive procedures/surgeries? No  Objective: CMP     Component Value Date/Time   NA 140 04/15/2023 1403   K 4.5 04/15/2023 1403   CL 100 04/15/2023 1403   CO2 23 04/15/2023 1403   GLUCOSE 87 04/15/2023 1403   GLUCOSE 94 01/07/2023 1333   BUN 10 04/15/2023 1403   CREATININE 0.97 04/15/2023 1403   CREATININE 0.94 01/07/2023 1333   CALCIUM 9.9 04/15/2023 1403   PROT 7.5 04/15/2023 1403   ALBUMIN 5.0 04/15/2023 1403   AST 21 04/15/2023 1403   ALT 28 04/15/2023 1403   ALKPHOS 85 04/15/2023 1403   BILITOT 0.4 04/15/2023 1403   GFRNONAA 87 04/01/2018 1031   GFRAA 101 04/01/2018 1031    CBC    Component Value Date/Time   WBC 6.6 04/15/2023 1403   WBC 5.4 01/07/2023 1333   RBC 5.21 04/15/2023 1403   RBC 5.25 01/07/2023 1333   HGB 14.8 04/15/2023 1403   HCT 45.2 04/15/2023 1403   PLT 302 04/15/2023 1403   MCV 87 04/15/2023 1403   MCH 28.4 04/15/2023 1403   MCH 27.8 01/07/2023 1333   MCHC 32.7 04/15/2023 1403   MCHC 32.7 01/07/2023 1333   RDW 13.3 04/15/2023 1403   LYMPHSABS 2.1 04/15/2023 1403   EOSABS 0.0 04/15/2023 1403   BASOSABS 0.0 04/15/2023 1403    Baseline Immunosuppressant Therapy Labs TB GOLD    Latest Ref Rng & Units 07/15/2023    3:54 PM  Quantiferon TB Gold  Quantiferon TB Gold Plus NEGATIVE NEGATIVE    Hepatitis Panel    Latest Ref Rng & Units 01/28/2020    3:40 PM  Hepatitis  Hep B Surface Ag NON-REACTI NON-REACTIVE   Hep B IgM NON-REACTI NON-REACTIVE   Hep C Ab NON-REACTI NON-REACTIVE   Hep A IgM NON-REACTI NON-REACTIVE    HIV Lab Results  Component Value Date   HIV  NON-REACTIVE 01/28/2020   HIV Non Reactive 07/14/2014   Immunoglobulins   SPEP    Latest Ref Rng & Units 04/15/2023    2:03 PM  Serum Protein Electrophoresis  Total Protein 6.0 - 8.5 g/dL 7.5    Chest x-ray: 08/03/9627 - Bronchitic changes without infiltrate.  Assessment/Plan:  Reviewed importance of holding Amjevita with signs/symptoms of an infections, if antibiotics are prescribed to treat an active infection, and with invasive procedures  Demonstrated proper injection technique with Amjevita demo device  Patient able to demonstrate proper injection technique using the teach back method.  Patient self injected in the right lower abdomen with:  Sample Medication: Amjevita 40mg /0.71mL pen injector NDC: [address]-01 Lot: [id number] Expiration: 02/27/2024  Patient tolerated well.  Observed for 30 mins in office for adverse reaction. Patient denies itchiness and irritation at injection., No swelling or redness noted., and Reviewed injection site reaction management with patient verbally and printed information for review in AVS  Patient is to return in 1 month for labs and 6-8 weeks for follow-up appointment.  Standing orders for CBC/CMP placed.  TB gold will be monitored yearly.  Referral to [facility] placed today for yearly skin checks while on TNF inhibitor due to risk for non melanoma skin cancer  Amjevita approved through insurance .   Rx sent to: [facility]: [phone] .  Patient provided with pharmacy phone number and advised to call later this week to schedule shipment to home.  Patient will continue Amjevita 40mg  subcut every 14 days as monotherapy  All questions encouraged and answered.  Instructed patient to call with any further questions or concerns.  Geraldene Kleine, PharmD, MPH, BCPS, CPP Clinical Pharmacist (Rheumatology and Pulmonology)  08/13/2023 11:29 AM

## 2023-08-13 ENCOUNTER — Ambulatory Visit: Attending: Internal Medicine | Admitting: Pharmacist

## 2023-08-13 DIAGNOSIS — M0600 Rheumatoid arthritis without rheumatoid factor, unspecified site: Secondary | ICD-10-CM | POA: Diagnosis not present

## 2023-08-13 DIAGNOSIS — Z7189 Other specified counseling: Secondary | ICD-10-CM | POA: Diagnosis not present

## 2023-08-13 DIAGNOSIS — Z79899 Other long term (current) drug therapy: Secondary | ICD-10-CM | POA: Diagnosis not present

## 2023-08-13 MED ORDER — AMJEVITA 40 MG/0.4ML ~~LOC~~ SOAJ: 40.0000 mg | SUBCUTANEOUS | 2 refills | Status: DC

## 2023-09-15 ENCOUNTER — Encounter: Payer: Self-pay | Admitting: Internal Medicine

## 2023-09-30 ENCOUNTER — Encounter: Payer: Self-pay | Admitting: Podiatry

## 2023-09-30 ENCOUNTER — Ambulatory Visit (INDEPENDENT_AMBULATORY_CARE_PROVIDER_SITE_OTHER): Admitting: Podiatry

## 2023-09-30 VITALS — Ht 70.0 in | Wt 246.0 lb

## 2023-09-30 DIAGNOSIS — M7752 Other enthesopathy of left foot: Secondary | ICD-10-CM | POA: Diagnosis not present

## 2023-09-30 NOTE — Progress Notes (Signed)
 Subjective:  Patient ID: Marco Reynolds, male    DOB: 26-Apr-1991,  MRN: [id number]  Chief Complaint  Patient presents with   Injections    Left second toe pain in joint. Had injection helped for a while now bothersome again    33 y.o. male returns for follow-up with the above complaint. History confirmed with patient.  He is on a new biologic medication which has helped quite a bit for the majority of his right foot pain he still has residual second toe pain on the left, the last injection helped significantly  Objective:  Physical Exam: warm, good capillary refill, no trophic changes or ulcerative lesions, normal DP and PT pulses, normal sensory exam, and today right foot and ankle doing well, he does have pain and swelling in the second MTP joint on the left foot  Radiographs: Ankle radiographs 08/02/2022 reviewed show no acute osseous abnormalities  Study Result  Narrative & Impression  CLINICAL DATA:  Medial and lateral right ankle pain. Sprained ankle 7 months ago.   EXAM: MRI OF THE RIGHT ANKLE WITHOUT CONTRAST   TECHNIQUE: Multiplanar, multisequence MR imaging of the ankle was performed. No intravenous contrast was administered.   COMPARISON:  Right ankle radiographs 07/31/2022   FINDINGS: TENDONS   Peroneal: There is mild intermediate T2 signal and a mild "chevron configuration" of the peroneus brevis tendon starting at the distal aspect of the fibula (axial series 4, image 16) and involving an approximate 1.5 cm length of the tendon (axial images 16 through 19, coronal series 7 images 16 through 19). The peroneus longus tendon is intact.   Posteromedial: Mild posterior tibial tenosynovitis. The flexor digitorum longus and flexor hallucis longus tendons are intact.   Anterior: The tibialis anterior, extensor hallucis longus, and extensor digitorum longus tendons are intact.   Achilles: Mild linear longitudinal intermediate T2 signal within the slightly  lateral aspect of the midsubstance of the Achilles tendon measuring up to 4 cm in length (sagittal series 6, image 15) but only measuring up to 1 mm in transverse dimension (axial images 8 through 18).   Plantar Fascia: There is minimal marrow edema within the posteroinferior calcaneus at the plantar fascia origin. Minimal edema at the deep aspect of the medial band of the plantar fascia without abnormal thickening (coronal series 7, image 13 and sagittal series 6, image 16).   LIGAMENTS   Lateral: The anterior and posterior talofibular, anterior and posterior tibiofibular, and calcaneofibular ligaments are intact.   Medial: The tibiotalar deep deltoid and tibial spring ligaments are intact.   CARTILAGE   Ankle Joint: Intact cartilage. Mild-to-moderate tibiotalar joint effusion with mild intermediate T2 signal focal synovitis anteriorly (sagittal series 6 images 11 through 14).   Subtalar Joints/Sinus Tarsi: The subtalar cartilage is intact.Fat is preserved within the sinus tarsi.   Bones: Mild dorsal talonavicular cartilage thinning and degenerative osteophytosis.   Other: The tarsal tunnel is unremarkable. The Lisfranc ligament complex is intact.   There is mild medial and lateral ankle subcutaneous fat edema and swelling.   IMPRESSION: 1. Mild peroneus brevis partial-thickness longitudinal split tear. 2. Mild posterior tibial tenosynovitis. 3. Minimal marrow edema within the posteroinferior calcaneus at the plantar fascia origin. Minimal edema at the deep aspect of the medial band of the plantar fascia without abnormal thickening. 4. Mild-to-moderate tibiotalar joint effusion with mild focal synovitis anteriorly.     Electronically Signed   By: Bertina Broccoli M.D.   On: 08/22/2022 11:06   Assessment:  1. Capsulitis of metatarsophalangeal (MTP) joint of left foot       Plan:  Patient was evaluated and treated and all questions answered.  Has made quite a  bit of improvement.  Corticosteroid injection of the left second MTP joint was significantly helpful.  Expect 1 further injection should eliminate the remainder of his issue here.  Discussed risks and benefits of corticosteroid injection including risk of infection he will notify me if there are signs of this.  Does have some hypopigmentation which we discussed.  Also discussed using a lower dose since his symptoms have improved.  Following consent and prepped with Betadine and using ethyl chloride spray as a topical anesthetic 10 mg Kenalog  and 0.5 cc of 0.5% Marcaine plain was injected into the left second MTPJ.  He tolerated this well and was dressed with a bandage.  No follow-ups on file.

## 2023-10-20 ENCOUNTER — Other Ambulatory Visit: Payer: Self-pay | Admitting: Internal Medicine

## 2023-10-20 DIAGNOSIS — M0600 Rheumatoid arthritis without rheumatoid factor, unspecified site: Secondary | ICD-10-CM

## 2023-10-20 NOTE — Telephone Encounter (Signed)
 Last Fill: 08/13/2023  Labs: 06/25/2023 WNL  TB Gold: 07/15/2023 Neg    Next Visit: 11/14/2023  Last Visit: 07/15/2023  IK:Dzmnwzhjupcz inflammatory arthritis   Current Dose per office note 07/15/2023: not discussed  Patient to update labs at  upcoming appointment on 11/14/2023  Okay to refill Amjevita ?

## 2023-11-11 ENCOUNTER — Other Ambulatory Visit: Payer: Self-pay | Admitting: Internal Medicine

## 2023-11-11 DIAGNOSIS — M0600 Rheumatoid arthritis without rheumatoid factor, unspecified site: Secondary | ICD-10-CM

## 2023-11-14 ENCOUNTER — Ambulatory Visit: Admitting: Internal Medicine

## 2023-11-17 NOTE — Progress Notes (Signed)
 Office Visit Note  Patient: Marco Reynolds             Date of Birth: [birth year]           MRN: [id number]             PCP: Cathlene Marry Lenis, FNP (Inactive) Referring: Cathlene Marry Lenis* Visit Date: 11/25/2023   Subjective:  Follow-up (Patient would like to talk about his Amjevita .)   Discussed the use of AI scribe software for clinical note transcription with the patient, who gave verbal consent to proceed.  History of Present Illness   Marco Reynolds is a 33 y.o. male with seronegative inflammatory arthritis here for follow up now on Amjevita  40mg  subcut every 14 days    He has experienced significant improvement in his psoriatic arthritis symptoms, with over fifty percent betterment in his day-to-day life. He is now able to jog and play golf, activities he could not do for a year. However, he still experiences some pain in his ankles, particularly at night after increased activity, describing it as feeling like a 'rolled ankle that won't heal.' Does still have some persistent right ankle pain but mostly only provoked after physical activities.  He experiences morning stiffness lasting about thirty minutes. No recent infections or illnesses requiring antibiotics, but minor cuts and scrapes take longer to heal, which he attributes to his medication.  He has significantly reduced his use of over-the-counter medications for arthritis, only taking his prescribed shot and a daily statin. He has not needed Tylenol or other pain relievers recently.  He has experienced some weight gain and decreased physical activity over the past year, which he acknowledges could impact his overall health. He has scheduled testing for testosterone  levels.       Previous HPI 07/15/2023 Marco Reynolds is a 33 y.o. male here for follow up for right ankle pain and left toe pain with persistent joint swelling now just starting methotrexate  25 mg PO weekly and folic acid  1 mg  daily.     He has persistent pain and swelling in his right ankle, which has been unstable since early to mid last year. The pain is described as a 'really bad ache' that persists regardless of activity. Recently, he experienced shooting pain up his calf when pressure was applied to a specific area on the ankle, leading to increased soreness in the calf. The pain radiates from the ankle into the arch of his foot and up the calf.   Previous treatments, including multiple injections and methotrexate , have not significantly improved his symptoms. Methotrexate , even when split into two doses, caused severe headaches without noticeable improvement in his condition. A recent dual energy CT scan showed no evidence of gout or other crystal-related arthritis.   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   He mentions occasional soreness in his wrist and sciatic-type pain, but these are not as concerning as the ankle pain. No significant back pain. His left second toe remains stiff and somewhat swollen, described as 'sausage-like', and his right ankle is notably swollen on the lateral side. No pain in the Achilles tendon and the front of the ankle.    Recent labs were checked last month and reviewed:   06/24/23 ESR 23 Vit D 20.8 Lipids TChol 171 HDL 35 CBC wnl CMP wnl   Previous HPI 05/14/2023 Marco Reynolds is a  33 y.o. male here for follow up for right ankle pain and left toe pain with persistent joint swelling now just starting methotrexate  15 mg PO weekly and folic acid  1 mg daily. He has been on methotrexate  for over two months, supplemented with folic acid . They report no side effects from the medication, but also no noticeable improvement in their condition. The patient's blood work, conducted during a follow-up visit, appeared normal. They also underwent allergy  testing, which  revealed sensitivities to cats, dogs, and certain trees. Resuming allergy  medication has improved their overall well-being, but has not impacted their joint pain.   The patient's pain is primarily located in their ankle, described as a throbbing sensation that intensifies with movement and can disrupt sleep. The pain typically subsides slightly after a few days of rest. The patient has tried to increase their physical activity, but this has resulted in increased pain. They have not been on any other steroids since their last visit, although they did receive a steroid shot in their left second toe several months ago.   The patient has also noticed a change in their gait due to the pain, walking primarily on their heel to avoid putting weight on the affected ankle. This has led to additional discomfort in their knee, hip, and back. The patient has not observed any visible swelling or redness in the ankle, but reports that it feels puffy compared to the other ankle.   The patient has previously tried multiple treatments for their condition, including three steroid shots in the right ankle, which did not provide any relief. They express a desire to ensure the appropriateness of any further treatment escalation, given the lack of improvement with current therapies.       Previous HPI 03/10/2023 Marco Reynolds is a 33 y.o. male here for follow up for right ankle pain and left toe pain with persistent joint swelling now just starting methotrexate  15 mg PO weekly and folic acid  1 mg daily.  He was recommended to switch after visit with Dr. Curtis in sports medicine clinic with persistent ankle swelling despite continuing sulfasalazine  treatment.  Seems like the dosing instructions were unclear he started taking this medicine 1 tablet daily over the weekend after stopping sulfasalazine .  He thinks this may be causing headaches otherwise has not noticed any difference.  Besides that in the interval  he had a steroid injection in the left great toe for pain and swelling and this was very effective unlike the 3 injections he has tried in the right ankle.   Previous HPI 01/07/2023 Marco Reynolds is a 33 y.o. male here for follow up for right ankle pain and left toe pain with persistent joint swelling after trial of starting sulfasalazine  1000 mg twice daily.  Lab test at initial visit negative for any serologic marker antibody markers and negative for HLA-B27 but showing persistent elevated CRP and sedimentation rate.  He started the sulfasalazine  500 mg twice daily for 1 week then 1000 mg twice daily now at about 1 month duration so far has not noticed any appreciable change in symptoms.  He followed up with Dr. Claudene started on allopurinol  which he is tolerating.  Was also started on venlafaxine  initially 37.5 mg and increase to 75 mg daily.  Despite all this still has symptoms pain is more with use and weightbearing versus direct pressure on the affected areas.  Sometimes gets a catch sensation in the right ankle that extends onto the medial side and bottom  of the arch.  Persistent swelling at the left second toe is now worse at the base of the toe and less distally.   Previous HPI 11/25/22  Marco Reynolds is a 33 y.o. male here for evaluation of joint pain and swelling mostly in the right ankle but few other joints also involved.  This started since March 16 he woke up with acute onsets of significant pain and swelling.  Tried taking oral anti-inflammatory medications with limited benefit.  Saw Dr. Claudene had imaging trial of several steroid injections for concern of ankle inflammation or tendinopathy.  Went for MRI of the ankle in April demonstrated some chronic tendon changes also tibiotalar joint effusion.  In total to lateral and 1 medial steroid injection these were somewhat helpful and lateral ankle pain did improve but incomplete benefit.  During this time was also treated with a  round of medication for bronchitis that resolved but did not see relief in joint pain with resolution.  More recently also started to have visible swelling and pain at the base of second and third toes on the left foot and a feeling like a nodule or mass pressing on the bottom of the foot while weightbearing.  Currently taking meloxicam  15 mg daily instead of over-the-counter NSAIDs which is partially controlling symptoms but still having a lot of activity and mobility limitations. Prior to onset of the symptoms he did not recall any new injury or change in activity.  He was sick with uncomplicated course of COVID in January. He had a previous history of right knee and left ankle pain and effusions in 2020 found to have pseudogout confirmed on knee aspirate with intracellular calcium pyrophosphate crystals.  Also around that time did have 1 small kidney stone.  Also with hypercalcemia and hyperparathyroidism these are thought incidental to hide baseline serum protein level and mild vitamin D  insufficiency that resolved.   Review of Systems  Constitutional:  Negative for fatigue.  HENT:  Negative for mouth sores and mouth dryness.   Eyes:  Negative for dryness.  Respiratory:  Negative for shortness of breath.   Cardiovascular:  Negative for chest pain and palpitations.  Gastrointestinal:  Negative for blood in stool, constipation and diarrhea.  Endocrine: Negative for increased urination.  Genitourinary:  Negative for involuntary urination.  Musculoskeletal:  Positive for joint pain, joint pain, myalgias, morning stiffness and myalgias. Negative for gait problem, joint swelling, muscle weakness and muscle tenderness.  Skin:  Negative for color change, rash, hair loss and sensitivity to sunlight.  Allergic/Immunologic: Negative for susceptible to infections.  Neurological:  Negative for dizziness and headaches.  Hematological:  Negative for swollen glands.  Psychiatric/Behavioral:  Negative for  depressed mood and sleep disturbance. The patient is not nervous/anxious.     PMFS History:  Patient Active Problem List   Diagnosis Date Noted   Prediabetes 07/08/2023   Vitamin D  deficiency 07/08/2023   Mixed hyperlipidemia 05/08/2023   Pseudogout 04/16/2023   Polyarthralgia, inflammatory arthritis 03/04/2023   History of pseudogout 01/07/2023   Polyarthritis 11/25/2022   High risk medication use 11/25/2022   Tenosynovitis of tibialis posterior tendon 10/11/2022   Peroneal tendon tear, right, initial encounter 08/30/2022   Ankle effusion, right 07/31/2022   Hypercalcemia 02/04/2020   Epididymitis, left 01/28/2020   Left ankle pain 07/17/2018   Obesity (BMI 30.0-34.9) 04/01/2018   Deviated nasal septum 07/21/2017   Nasal turbinate hypertrophy 07/21/2017   Seasonal allergic rhinitis due to pollen 12/16/2016    Past Medical  History:  Diagnosis Date   Allergy     Angio-edema    Effusion of knee joint right 07/17/2018   History of hyperlipidemia     Family History  Problem Relation Age of Onset   Allergic rhinitis Mother    Healthy Mother    Gestational diabetes Mother    Miscarriages / [place] Mother    Allergic rhinitis Father    Healthy Father    Hyperlipidemia Father    Hypertension Father    Kidney disease Father    Gout Father    Gout Brother    Healthy Brother    Healthy Brother    Healthy Daughter    Healthy Son    Past Surgical History:  Procedure Laterality Date   MENISCUS REPAIR Right    Social History   Social History Narrative   Not on file   Immunization History  Administered Date(s) Administered   DTaP 03/13/1989, 05/13/1989, 07/08/1992, 07/23/1992, 03/11/1994   HIB (PRP-OMP) 03/13/1989, 05/13/1989, 07/08/1989, 05/07/1990   Hepatitis A, Adult 09/09/2017   Hepatitis B 01/29/2001, 03/05/2001, 07/16/2001   Hepatitis B, ADULT 09/18/2017, 11/04/2017, 01/08/2018   IPV 03/13/1989, 05/13/1989, 07/23/1989, 03/11/1994   Influenza Inj Mdck Quad  Pf 02/11/2022   Influenza Inj Mdck Quad With Preservative 02/07/2020, 02/05/2021, 02/11/2022   Influenza, Seasonal, Injecte, Preservative Fre 05/08/2023   Influenza,inj,Quad PF,6+ Mos 02/13/2017, 02/09/2019   Influenza-Unspecified 02/06/2016, 01/27/2018, 02/07/2020, 02/11/2022   MMR 05/07/1990, 03/11/1994   Meningococcal Mcv4o 09/09/2017   Moderna Sars-Covid-2 Vaccination 07/01/2019, 08/03/2019, 03/02/2020   Tdap 10/24/2006, 07/14/2014     Objective: Vital Signs: BP (!) 145/87 (BP Location: Left Arm, Patient Position: Sitting, Cuff Size: Large)   Pulse 80   Resp 14   Ht 5' 10 (1.778 m)   Wt 247 lb (112 kg)   BMI 35.44 kg/m    Physical Exam Eyes:     Conjunctiva/sclera: Conjunctivae normal.  Cardiovascular:     Rate and Rhythm: Normal rate and regular rhythm.  Pulmonary:     Effort: Pulmonary effort is normal.     Breath sounds: Normal breath sounds.  Musculoskeletal:     Right lower leg: No edema.     Left lower leg: No edema.  Skin:    General: Skin is warm and dry.  Neurological:     Mental Status: He is alert.  Psychiatric:        Mood and Affect: Mood normal.      Musculoskeletal Exam:  Shoulders full ROM no tenderness or swelling Elbows full ROM no tenderness or swelling Wrists full ROM no tenderness or swelling Fingers full ROM no tenderness or swelling Hip normal internal and external rotation without pain, no tenderness to lateral hip palpation Knees full ROM no tenderness or swelling Right ankle mildly restricted eversion, with pain worse at and distal to medial malleolus   Investigation: No additional findings.  Imaging: No results found.  Recent Labs: Lab Results  Component Value Date   WBC 5.1 11/25/2023   HGB 15.0 11/25/2023   PLT 238 11/25/2023   NA 139 11/25/2023   K 4.2 11/25/2023   CL 101 11/25/2023   CO2 26 11/25/2023   GLUCOSE 92 11/25/2023   BUN 14 11/25/2023   CREATININE 0.90 11/25/2023   BILITOT 0.8 11/25/2023   ALKPHOS 85  04/15/2023   AST 23 11/25/2023   ALT 25 11/25/2023   PROT 7.5 11/25/2023   ALBUMIN 5.0 04/15/2023   CALCIUM 9.6 11/25/2023   GFRAA 101 04/01/2018   QFTBGOLDPLUS NEGATIVE  07/15/2023    Speciality Comments: Most recent labs from 09/16/2023 are in the  [facility] tab  Procedures:  No procedures performed Allergies: Patient has no known allergies.   Assessment / Plan:     Visit Diagnoses: Seronegative rheumatoid arthritis (HCC) - Plan: Adalimumab -atto (AMJEVITA ) 40 MG/0.4ML SOAJ, Sedimentation rate Psoriatic arthritis with significant symptom improvement, over 50% better in daily activities, reduced swelling, and ability to jog and play golf. Morning stiffness lasts 30 minutes, favorable for inflammatory arthritis. Occasional nocturnal ankle pain, possibly from subtalar joint involvement. Minimal OTC medication use. Discussed slower healing of cuts and scrapes due to TNF inhibitor use, paused before surgery. - Continue Amjevita  40 mg subcu q. 14 days - Encourage ankle stretches to improve flexibility and reduce eversion movement. - Rechecking sed rate for disease activity monitoring  High risk medication use - Amjevita  40mg  subcut every 14 days - Plan: CBC with Differential/Platelet, Comprehensive metabolic panel with GFR Elevated liver enzymes Mild AST elevation at 58, not clinically significant. Possible fatty liver disease. Current medication not highly hepatotoxic. - Recheck liver enzyme levels today with CBC and CMP for medication monitoring.        Orders: Orders Placed This Encounter  Procedures   Sedimentation rate   CBC with Differential/Platelet   Comprehensive metabolic panel with GFR   Meds ordered this encounter  Medications   Adalimumab -atto (AMJEVITA ) 40 MG/0.4ML SOAJ    Sig: Inject 0.4 mLs into the skin every 14 (fourteen) days.    Dispense:  0.8 mL    Refill:  3     Follow-Up Instructions: Return in about 3 months (around 02/25/2024) for RA on ADA f/u  3mos.   Lonni LELON Ester, MD  Note - This record has been created using [employer].  Chart creation errors have been sought, but may not always  have been located. Such creation errors do not reflect on  the standard of medical care.

## 2023-11-19 NOTE — Addendum Note (Signed)
 Addended by: DAYNE SHERRY RAMAN on: 11/19/2023 09:52 AM   Modules accepted: Level of Service

## 2023-11-25 ENCOUNTER — Encounter: Payer: Self-pay | Admitting: Internal Medicine

## 2023-11-25 ENCOUNTER — Ambulatory Visit: Admitting: Family Medicine

## 2023-11-25 ENCOUNTER — Ambulatory Visit: Attending: Internal Medicine | Admitting: Internal Medicine

## 2023-11-25 VITALS — BP 145/87 | HR 80 | Resp 14 | Ht 70.0 in | Wt 247.0 lb

## 2023-11-25 DIAGNOSIS — M0600 Rheumatoid arthritis without rheumatoid factor, unspecified site: Secondary | ICD-10-CM | POA: Diagnosis not present

## 2023-11-25 DIAGNOSIS — M778 Other enthesopathies, not elsewhere classified: Secondary | ICD-10-CM

## 2023-11-25 DIAGNOSIS — Z79899 Other long term (current) drug therapy: Secondary | ICD-10-CM

## 2023-11-25 LAB — CBC WITH DIFFERENTIAL/PLATELET
Absolute Lymphocytes: 2871 {cells}/uL (ref 850–3900)
Absolute Monocytes: 337 {cells}/uL (ref 200–950)
Basophils Absolute: 10 {cells}/uL (ref 0–200)
Basophils Relative: 0.2 %
Eosinophils Absolute: 61 {cells}/uL (ref 15–500)
Eosinophils Relative: 1.2 %
HCT: 45.8 % (ref 38.5–50.0)
Hemoglobin: 15 g/dL (ref 13.2–17.1)
MCH: 28.7 pg (ref 27.0–33.0)
MCHC: 32.8 g/dL (ref 32.0–36.0)
MCV: 87.7 fL (ref 80.0–100.0)
MPV: 11.1 fL (ref 7.5–12.5)
Monocytes Relative: 6.6 %
Neutro Abs: 1821 {cells}/uL (ref 1500–7800)
Neutrophils Relative %: 35.7 %
Platelets: 238 Thousand/uL (ref 140–400)
RBC: 5.22 Million/uL (ref 4.20–5.80)
RDW: 13.5 % (ref 11.0–15.0)
Total Lymphocyte: 56.3 %
WBC: 5.1 Thousand/uL (ref 3.8–10.8)

## 2023-11-25 LAB — COMPREHENSIVE METABOLIC PANEL WITH GFR
AG Ratio: 2 (calc) (ref 1.0–2.5)
ALT: 25 U/L (ref 9–46)
AST: 23 U/L (ref 10–40)
Albumin: 5 g/dL (ref 3.6–5.1)
Alkaline phosphatase (APISO): 51 U/L (ref 36–130)
BUN: 14 mg/dL (ref 7–25)
CO2: 26 mmol/L (ref 20–32)
Calcium: 9.6 mg/dL (ref 8.6–10.3)
Chloride: 101 mmol/L (ref 98–110)
Creat: 0.9 mg/dL (ref 0.60–1.26)
Globulin: 2.5 g/dL (ref 1.9–3.7)
Glucose, Bld: 92 mg/dL (ref 65–99)
Potassium: 4.2 mmol/L (ref 3.5–5.3)
Sodium: 139 mmol/L (ref 135–146)
Total Bilirubin: 0.8 mg/dL (ref 0.2–1.2)
Total Protein: 7.5 g/dL (ref 6.1–8.1)
eGFR: 115 mL/min/1.73m2 (ref >=60)

## 2023-11-25 LAB — SEDIMENTATION RATE: Sed Rate: 2 mm/h (ref 0–15)

## 2023-11-25 MED ORDER — AMJEVITA 40 MG/0.4ML ~~LOC~~ SOAJ: 0.4000 mL | SUBCUTANEOUS | 3 refills | Status: DC

## 2023-12-03 ENCOUNTER — Ambulatory Visit (INDEPENDENT_AMBULATORY_CARE_PROVIDER_SITE_OTHER): Admitting: Family Medicine

## 2023-12-03 ENCOUNTER — Ambulatory Visit: Admitting: Family Medicine

## 2023-12-03 ENCOUNTER — Encounter: Payer: Self-pay | Admitting: Family Medicine

## 2023-12-03 VITALS — BP 130/84 | HR 68 | Temp 96.4°F | Ht 70.0 in | Wt 245.0 lb

## 2023-12-03 DIAGNOSIS — M0609 Rheumatoid arthritis without rheumatoid factor, multiple sites: Secondary | ICD-10-CM | POA: Diagnosis not present

## 2023-12-03 DIAGNOSIS — E66811 Obesity, class 1: Secondary | ICD-10-CM | POA: Diagnosis not present

## 2023-12-03 DIAGNOSIS — Z79899 Other long term (current) drug therapy: Secondary | ICD-10-CM | POA: Diagnosis not present

## 2023-12-03 DIAGNOSIS — R5381 Other malaise: Secondary | ICD-10-CM

## 2023-12-03 DIAGNOSIS — E782 Mixed hyperlipidemia: Secondary | ICD-10-CM | POA: Diagnosis not present

## 2023-12-03 DIAGNOSIS — E559 Vitamin D deficiency, unspecified: Secondary | ICD-10-CM

## 2023-12-03 DIAGNOSIS — R7303 Prediabetes: Secondary | ICD-10-CM | POA: Diagnosis not present

## 2023-12-03 DIAGNOSIS — R5383 Other fatigue: Secondary | ICD-10-CM | POA: Diagnosis not present

## 2023-12-03 DIAGNOSIS — R0683 Snoring: Secondary | ICD-10-CM

## 2023-12-03 NOTE — Progress Notes (Signed)
 Subjective:  Patient ID: Marco Reynolds, male    DOB: [birth year], 33 y.o.   MRN: [id number]  Patient Care Team: Severa Rock HERO, FNP as PCP - General (Family Medicine) Elsa Lonni SAUNDERS, MD as Consulting Physician (Orthopedic Surgery)   Chief Complaint:  Establish Care (Previous Marry patient ) and Fatigue (Would like testosterone  checked )   HPI: Marco Reynolds is a 33 y.o. male presenting on 12/03/2023 for Establish Care (Previous Marry patient ) and Fatigue (Would like testosterone  checked )   Marco Reynolds is a 33 year old male with seronegative rheumatoid arthritis who presents with ongoing fatigue.  He experiences persistent fatigue despite adequate nutrition and rest. This fatigue continues even though his rheumatoid arthritis symptoms have improved with Amjevita  injections, which he has been using for several months. The medication has significantly alleviated his ankle pain, enabling him to engage in activities like working out and playing golf, but his energy levels remain low.  He has a history of seronegative rheumatoid arthritis, initially diagnosed due to ankle pain. Over the past year, he has tried various treatments and found significant relief with Imjuvita injections, similar to Humira. This is the first treatment providing more than temporary relief, allowing increased mobility and weight loss.  He takes rosuvastatin for cholesterol management, initially experiencing headaches that improved after reducing the dose from 10 mg to 5 mg. He has not noticed an increase in fatigue since starting the statin.  His family history includes his father and brother being on testosterone  replacement therapy. He notes a decreased libido, which he attributes to being busy with his children.  He has a history of low vitamin D  levels and previously took supplements including fish oil, a multivitamin, and vitamin D . He has also had high calcium levels in the  past, which led to an endocrinology consultation.  He experiences loud snoring, particularly when gaining weight, and feels he cannot breathe well when lying on his back. He attempts to improve his sleep by maintaining a consistent sleep schedule, getting about seven and a half hours of sleep per night.       Relevant past medical, surgical, family, and social history reviewed and updated as indicated.  Allergies and medications reviewed and updated. Data reviewed: Chart in Epic.   Past Medical History:  Diagnosis Date   Allergy     Angio-edema    Effusion of knee joint right 07/17/2018   History of hyperlipidemia     Past Surgical History:  Procedure Laterality Date   MENISCUS REPAIR Right     Social History   Socioeconomic History   Marital status: Married    Spouse name: Not on file   Number of children: Not on file   Years of education: Not on file   Highest education level: Bachelor's degree (e.g., BA, AB, BS)  Occupational History   Not on file  Tobacco Use   Smoking status: Never    Passive exposure: Never   Smokeless tobacco: Former    Types: Snuff    Quit date: 01/28/2023  Vaping Use   Vaping status: Never Used  Substance and Sexual Activity   Alcohol use: Yes   Drug use: Never   Sexual activity: Yes    Birth control/protection: None  Other Topics Concern   Not on file  Social History Narrative   Not on file   Social Drivers of Health   Financial Resource Strain: Low Risk  (12/02/2023)   Overall Physicist, medical  Strain (CARDIA)    Difficulty of Paying Living Expenses: Not hard at all  Food Insecurity: No Food Insecurity (12/02/2023)   Hunger Vital Sign    Worried About Running Out of Food in the Last Year: Never true    Ran Out of Food in the Last Year: Never true  Transportation Needs: No Transportation Needs (12/02/2023)   PRAPARE - Administrator, Civil Service (Medical): No    Lack of Transportation (Non-Medical): No  Physical  Activity: Insufficiently Active (12/02/2023)   Exercise Vital Sign    Days of Exercise per Week: 3 days    Minutes of Exercise per Session: 30 min  Stress: No Stress Concern Present (12/02/2023)   [employer] of Occupational Health - Occupational Stress Questionnaire    Feeling of Stress: Not at all  Social Connections: Socially Integrated (12/02/2023)   Social Connection and Isolation Panel    Frequency of Communication with Friends and Family: More than three times a week    Frequency of Social Gatherings with Friends and Family: Once a week    Attends Religious Services: More than 4 times per year    Active Member of [employer] or Organizations: Yes    Attends Banker Meetings: More than 4 times per year    Marital Status: Married  Catering manager Violence: Not on file    Outpatient Encounter Medications as of 12/03/2023  Medication Sig   Adalimumab -atto (AMJEVITA ) 40 MG/0.4ML SOAJ Inject 0.4 mLs into the skin every 14 (fourteen) days.   rosuvastatin (CRESTOR) 5 MG tablet Take 5 mg by mouth daily.   No facility-administered encounter medications on file as of 12/03/2023.    No Known Allergies  Pertinent ROS per HPI, otherwise unremarkable      Objective:  BP 130/84   Pulse 68   Temp (!) 96.4 F (35.8 C)   Ht 5' 10 (1.778 m)   Wt 245 lb (111.1 kg)   SpO2 95%   BMI 35.15 kg/m    Wt Readings from Last 3 Encounters:  12/03/23 245 lb (111.1 kg)  11/25/23 247 lb (112 kg)  09/30/23 246 lb (111.6 kg)    Physical Exam Vitals and nursing note reviewed.  Constitutional:      General: He is not in acute distress.    Appearance: Normal appearance. He is well-developed and well-groomed. He is obese. He is not ill-appearing, toxic-appearing or diaphoretic.  HENT:     Head: Normocephalic and atraumatic.     Jaw: There is normal jaw occlusion.     Right Ear: Hearing normal.     Left Ear: Hearing normal.     Nose: Nose normal.     Mouth/Throat:     Lips: Pink.      Mouth: Mucous membranes are moist.     Pharynx: Oropharynx is clear. Uvula midline.  Eyes:     General: Lids are normal.     Extraocular Movements: Extraocular movements intact.     Conjunctiva/sclera: Conjunctivae normal.     Pupils: Pupils are equal, round, and reactive to light.  Neck:     Thyroid : No thyroid  mass, thyromegaly or thyroid  tenderness.     Vascular: No carotid bruit or JVD.     Trachea: Trachea and phonation normal.  Cardiovascular:     Rate and Rhythm: Normal rate and regular rhythm.     Chest Wall: PMI is not displaced.     Pulses: Normal pulses.     Heart sounds: Normal heart  sounds. No murmur heard.    No friction rub. No gallop.  Pulmonary:     Effort: Pulmonary effort is normal. No respiratory distress.     Breath sounds: Normal breath sounds. No wheezing.  Abdominal:     General: Bowel sounds are normal.     Palpations: Abdomen is soft.  Musculoskeletal:        General: Normal range of motion.     Cervical back: Normal range of motion and neck supple.     Right lower leg: No edema.     Left lower leg: No edema.  Lymphadenopathy:     Cervical: No cervical adenopathy.  Skin:    General: Skin is warm and dry.     Capillary Refill: Capillary refill takes less than 2 seconds.     Coloration: Skin is not cyanotic, jaundiced or pale.     Findings: No rash.  Neurological:     General: No focal deficit present.     Mental Status: He is alert and oriented to person, place, and time.     Sensory: Sensation is intact.     Motor: Motor function is intact.     Coordination: Coordination is intact.     Gait: Gait is intact.     Deep Tendon Reflexes: Reflexes are normal and symmetric.  Psychiatric:        Attention and Perception: Attention and perception normal.        Mood and Affect: Mood and affect normal.        Speech: Speech normal.        Behavior: Behavior normal. Behavior is cooperative.        Thought Content: Thought content normal.         Cognition and Memory: Cognition and memory normal.        Judgment: Judgment normal.       Results for orders placed or performed in visit on 11/25/23  Sedimentation rate   Collection Time: 11/25/23  8:53 AM  Result Value Ref Range   Sed Rate 2 0 - 15 mm/h  CBC with Differential/Platelet   Collection Time: 11/25/23  8:53 AM  Result Value Ref Range   WBC 5.1 3.8 - 10.8 Thousand/uL   RBC 5.22 4.20 - 5.80 Million/uL   Hemoglobin 15.0 13.2 - 17.1 g/dL   HCT 54.1 61.4 - 49.9 %   MCV 87.7 80.0 - 100.0 fL   MCH 28.7 27.0 - 33.0 pg   MCHC 32.8 32.0 - 36.0 g/dL   RDW 86.4 88.9 - 84.9 %   Platelets 238 140 - 400 Thousand/uL   MPV 11.1 7.5 - 12.5 fL   Neutro Abs 1,821 1,500 - 7,800 cells/uL   Absolute Lymphocytes 2,871 850 - 3,900 cells/uL   Absolute Monocytes 337 200 - 950 cells/uL   Eosinophils Absolute 61 15 - 500 cells/uL   Basophils Absolute 10 0 - 200 cells/uL   Neutrophils Relative % 35.7 %   Total Lymphocyte 56.3 %   Monocytes Relative 6.6 %   Eosinophils Relative 1.2 %   Basophils Relative 0.2 %  Comprehensive metabolic panel with GFR   Collection Time: 11/25/23  8:53 AM  Result Value Ref Range   Glucose, Bld 92 65 - 99 mg/dL   BUN 14 7 - 25 mg/dL   Creat 9.09 9.39 - 8.73 mg/dL   eGFR 884 > OR = 60 fO/fpw/8.26f7   BUN/Creatinine Ratio SEE NOTE: 6 - 22 (calc)   Sodium 139 135 - 146  mmol/L   Potassium 4.2 3.5 - 5.3 mmol/L   Chloride 101 98 - 110 mmol/L   CO2 26 20 - 32 mmol/L   Calcium 9.6 8.6 - 10.3 mg/dL   Total Protein 7.5 6.1 - 8.1 g/dL   Albumin 5.0 3.6 - 5.1 g/dL   Globulin 2.5 1.9 - 3.7 g/dL (calc)   AG Ratio 2.0 1.0 - 2.5 (calc)   Total Bilirubin 0.8 0.2 - 1.2 mg/dL   Alkaline phosphatase (APISO) 51 36 - 130 U/L   AST 23 10 - 40 U/L   ALT 25 9 - 46 U/L       Pertinent labs & imaging results that were available during my care of the patient were reviewed by me and considered in my medical decision making.  Assessment & Plan:  Marco Reynolds was seen today for  establish care and fatigue.  Diagnoses and all orders for this visit:  Malaise and fatigue -     Anemia Profile B -     CMP14+EGFR -     Thyroid  Panel With TSH -     VITAMIN D  25 Hydroxy (Vit-D Deficiency, Fractures) -     Testosterone ,Free and Total -     Ambulatory referral to Sleep Studies  Rheumatoid arthritis of multiple sites with negative rheumatoid factor (HCC) -     Anemia Profile B -     CMP14+EGFR  Obesity (BMI 30.0-34.9) -     Anemia Profile B -     CMP14+EGFR -     Lipid panel -     Thyroid  Panel With TSH -     VITAMIN D  25 Hydroxy (Vit-D Deficiency, Fractures)  Vitamin D  deficiency -     CMP14+EGFR -     VITAMIN D  25 Hydroxy (Vit-D Deficiency, Fractures)  Prediabetes -     CMP14+EGFR  Mixed hyperlipidemia -     CMP14+EGFR -     Lipid panel  High risk medication use -     Anemia Profile B -     CMP14+EGFR -     Lipid panel -     Thyroid  Panel With TSH -     VITAMIN D  25 Hydroxy (Vit-D Deficiency, Fractures)  Loud snoring -     Ambulatory referral to Sleep Studies      Fatigue and possible hypogonadism Ongoing fatigue and decreased energy levels despite improvement in rheumatoid arthritis symptoms. Possible hypogonadism considered due to family history and symptoms. Differential diagnosis includes thyroid  dysfunction, anemia, and vitamin D  deficiency. - Order testosterone  level - Order thyroid  function tests - Order anemia profile including B12, folate, ferritin, and iron - Order comprehensive metabolic panel (CMP) - Order vitamin D  level - If testosterone  is low, repeat test to confirm - If confirmed low testosterone , discuss further management options  Possible obstructive sleep apnea Loud snoring and fatigue suggest possible obstructive sleep apnea, especially with weight gain. Discussed the potential impact of sleep apnea on fatigue and the option for a sleep study. - Refer for sleep study, potentially home-based, to evaluate for obstructive  sleep apnea  Vitamin D  deficiency Low vitamin D  levels previously managed with supplements. Vitamin D  deficiency could contribute to fatigue. - Order vitamin D  level - Consider vitamin D  supplementation based on results  Seronegative rheumatoid arthritis Managed with Amjevita  injections, which have significantly improved joint pain and mobility. No major side effects reported, except for transient headaches after initial doses.  Hyperlipidemia Managed with rosuvastatin. No significant fatigue noted from statin use, but  history of headaches with higher doses. Discussed potential for statins to contribute to fatigue and the option to switch to pravastatin if needed. - Consider switching to pravastatin if fatigue persists after ruling out other causes - Recommend over-the-counter CoQ10 supplement to mitigate statin side effects       Total time spent with patient today was 40 minutes, this time was spent reviewing prior charts, labs, x-rays, discussing plan of care, and documenting the encounter.    Continue all other maintenance medications.  Follow up plan: Return in 5 months (on 05/04/2024), or if symptoms worsen or fail to improve.   Continue healthy lifestyle choices, including diet (rich in fruits, vegetables, and lean proteins, and low in salt and simple carbohydrates) and exercise (at least 30 minutes of moderate physical activity daily).   The above assessment and management plan was discussed with the patient. The patient verbalized understanding of and has agreed to the management plan. Patient is aware to call the clinic if they develop any new symptoms or if symptoms persist or worsen. Patient is aware when to return to the clinic for a follow-up visit. Patient educated on when it is appropriate to go to the emergency department.   Rosaline Bruns, FNP-C [facility] [phone]

## 2023-12-04 ENCOUNTER — Ambulatory Visit: Payer: Self-pay | Admitting: Family Medicine

## 2023-12-04 DIAGNOSIS — R7989 Other specified abnormal findings of blood chemistry: Secondary | ICD-10-CM

## 2023-12-06 LAB — CMP14+EGFR
ALT: 30 IU/L (ref 0–44)
AST: 29 IU/L (ref 0–40)
Albumin: 5 g/dL (ref 4.1–5.1)
Alkaline Phosphatase: 55 IU/L (ref 44–121)
BUN/Creatinine Ratio: 17 (ref 9–20)
BUN: 16 mg/dL (ref 6–20)
Bilirubin Total: 0.5 mg/dL (ref 0.0–1.2)
CO2: 20 mmol/L (ref 20–29)
Calcium: 9.5 mg/dL (ref 8.7–10.2)
Chloride: 99 mmol/L (ref 96–106)
Creatinine, Ser: 0.93 mg/dL (ref 0.76–1.27)
Globulin, Total: 2.4 g/dL (ref 1.5–4.5)
Glucose: 89 mg/dL (ref 70–99)
Potassium: 4.4 mmol/L (ref 3.5–5.2)
Sodium: 137 mmol/L (ref 134–144)
Total Protein: 7.4 g/dL (ref 6.0–8.5)
eGFR: 111 mL/min/1.73 (ref >59)

## 2023-12-06 LAB — ANEMIA PROFILE B
Basophils Absolute: 0 x10E3/uL (ref 0.0–0.2)
Basos: 1 %
EOS (ABSOLUTE): 0.1 x10E3/uL (ref 0.0–0.4)
Eos: 1 %
Ferritin: 187 ng/mL (ref 30–400)
Folate: 18.3 ng/mL (ref >3.0)
Hematocrit: 46.2 % (ref 37.5–51.0)
Hemoglobin: 15.1 g/dL (ref 13.0–17.7)
Immature Grans (Abs): 0 x10E3/uL (ref 0.0–0.1)
Immature Granulocytes: 0 %
Iron Saturation: 27 % (ref 15–55)
Iron: 96 ug/dL (ref 38–169)
Lymphocytes Absolute: 2.4 x10E3/uL (ref 0.7–3.1)
Lymphs: 55 %
MCH: 28.7 pg (ref 26.6–33.0)
MCHC: 32.7 g/dL (ref 31.5–35.7)
MCV: 88 fL (ref 79–97)
Monocytes Absolute: 0.3 x10E3/uL (ref 0.1–0.9)
Monocytes: 6 %
Neutrophils Absolute: 1.6 x10E3/uL (ref 1.4–7.0)
Neutrophils: 37 %
Platelets: 241 x10E3/uL (ref 150–450)
RBC: 5.26 x10E6/uL (ref 4.14–5.80)
RDW: 13.5 % (ref 11.6–15.4)
Retic Ct Pct: 1.2 % (ref 0.6–2.6)
Total Iron Binding Capacity: 361 ug/dL (ref 250–450)
UIBC: 265 ug/dL (ref 111–343)
Vitamin B-12: 288 pg/mL (ref 232–1245)
WBC: 4.4 x10E3/uL (ref 3.4–10.8)

## 2023-12-06 LAB — THYROID PANEL WITH TSH
Free Thyroxine Index: 2 (ref 1.2–4.9)
T3 Uptake Ratio: 27 % (ref 24–39)
T4, Total: 7.3 ug/dL (ref 4.5–12.0)
TSH: 1.15 u[IU]/mL (ref 0.450–4.500)

## 2023-12-06 LAB — LIPID PANEL
Chol/HDL Ratio: 5.2 ratio — ABNORMAL HIGH (ref 0.0–5.0)
Cholesterol, Total: 173 mg/dL (ref 100–199)
HDL: 33 mg/dL — ABNORMAL LOW (ref >39)
LDL Chol Calc (NIH): 111 mg/dL — ABNORMAL HIGH (ref 0–99)
Triglycerides: 160 mg/dL — ABNORMAL HIGH (ref 0–149)
VLDL Cholesterol Cal: 29 mg/dL (ref 5–40)

## 2023-12-06 LAB — VITAMIN D 25 HYDROXY (VIT D DEFICIENCY, FRACTURES): Vit D, 25-Hydroxy: 33.5 ng/mL (ref 30.0–100.0)

## 2023-12-06 LAB — TESTOSTERONE,FREE AND TOTAL
Testosterone, Free: 5.9 pg/mL — ABNORMAL LOW (ref 8.7–25.1)
Testosterone: 279 ng/dL (ref 264–916)

## 2023-12-08 NOTE — Telephone Encounter (Signed)
 Patient aware and verbalized understanding. Follow up labs ordered and appointment scheduled.

## 2023-12-17 ENCOUNTER — Other Ambulatory Visit

## 2023-12-22 DIAGNOSIS — S134XXA Sprain of ligaments of cervical spine, initial encounter: Secondary | ICD-10-CM | POA: Diagnosis not present

## 2023-12-22 DIAGNOSIS — S233XXA Sprain of ligaments of thoracic spine, initial encounter: Secondary | ICD-10-CM | POA: Diagnosis not present

## 2023-12-22 DIAGNOSIS — S338XXA Sprain of other parts of lumbar spine and pelvis, initial encounter: Secondary | ICD-10-CM | POA: Diagnosis not present

## 2023-12-30 ENCOUNTER — Encounter: Payer: Self-pay | Admitting: Sports Medicine

## 2024-01-07 DIAGNOSIS — S338XXA Sprain of other parts of lumbar spine and pelvis, initial encounter: Secondary | ICD-10-CM | POA: Diagnosis not present

## 2024-01-07 DIAGNOSIS — S233XXA Sprain of ligaments of thoracic spine, initial encounter: Secondary | ICD-10-CM | POA: Diagnosis not present

## 2024-01-07 DIAGNOSIS — S134XXA Sprain of ligaments of cervical spine, initial encounter: Secondary | ICD-10-CM | POA: Diagnosis not present

## 2024-01-26 ENCOUNTER — Telehealth: Payer: Self-pay

## 2024-01-26 NOTE — Telephone Encounter (Signed)
 Received notification from [employer] regarding a prior authorization for AMJEVITA . Authorization has been APPROVED from 01/26/2024 to 01/25/2025. Approval letter sent to scan center.  Authorization # [id number]

## 2024-01-26 NOTE — Telephone Encounter (Signed)
 PA renewal initiated automatically by CoverMyMeds.  Submitted an URGENT Prior Authorization request to [employer] for AMJEVITA  via CoverMyMeds. Will update once we receive a response.   KEY: [id number]

## 2024-02-12 NOTE — Progress Notes (Signed)
 Office Visit Note  Patient: Marco Reynolds             Date of Birth: May 22, 1990           MRN: [id number]             PCP: [place] HERO, FNP Referring: No ref. provider found Visit Date: 02/25/2024   Subjective:  Medical Management of Chronic Issues (Patients like he's at a stand still as far as function and daily activities of life)    Discussed the use of AI scribe software for clinical note transcription with the patient, who gave verbal consent to proceed.  History of Present Illness   Marco Reynolds is a 33 y.o. male here for follow up with seronegative inflammatory arthritis here for follow up now on Amjevita  40mg  subcut every 14 days.   He experiences persistent joint symptoms, particularly in the ankles and knuckles. Initially, there was some improvement, but his condition has plateaued over the past couple of months. Ankle symptoms are most noticeable with movement, such as lifting and walking, but not at rest. The knuckles are slightly worse in the mornings, though they do not limit daily activities, including work and golf.  He has noticed a change in skin coloration, with others commenting on his tan appearance, though he does not feel different. There is some redness on his face and blanching in his hands. No recent illness despite exposure to sick children, and no significant allergy  symptoms recently.  Regarding medication, he discusses challenges with prescription refills and the use of a savings card for his medication, which he finds cumbersome. He has not been sick since starting his current medication regimen and has not noticed any significant side effects.  In the review of symptoms, no significant swelling in his hands, though he notes difficulty wearing his ring due to a slightly larger knuckle. He has not experienced any significant allergy  symptoms and has not been taking antihistamines recently.       Previous HPI 11/25/2023 Marco Reynolds is a 33 y.o. male with seronegative inflammatory arthritis here for follow up now on Amjevita  40mg  subcut every 14 days     He has experienced significant improvement in his psoriatic arthritis symptoms, with over fifty percent betterment in his day-to-day life. He is now able to jog and play golf, activities he could not do for a year. However, he still experiences some pain in his ankles, particularly at night after increased activity, describing it as feeling like a 'rolled ankle that won't heal.' Does still have some persistent right ankle pain but mostly only provoked after physical activities.   He experiences morning stiffness lasting about thirty minutes. No recent infections or illnesses requiring antibiotics, but minor cuts and scrapes take longer to heal, which he attributes to his medication.   He has significantly reduced his use of over-the-counter medications for arthritis, only taking his prescribed shot and a daily statin. He has not needed Tylenol or other pain relievers recently.   He has experienced some weight gain and decreased physical activity over the past year, which he acknowledges could impact his overall health. He has scheduled testing for testosterone  levels.         Previous HPI 07/15/2023 Marco Reynolds is a 33 y.o. male here for follow up for right ankle pain and left toe pain with persistent joint swelling now just starting methotrexate  25 mg PO weekly and folic acid  1 mg daily.  He has persistent pain and swelling in his right ankle, which has been unstable since early to mid last year. The pain is described as a 'really bad ache' that persists regardless of activity. Recently, he experienced shooting pain up his calf when pressure was applied to a specific area on the ankle, leading to increased soreness in the calf. The pain radiates from the ankle into the arch of his foot and up the calf.   Previous treatments, including multiple injections  and methotrexate , have not significantly improved his symptoms. Methotrexate , even when split into two doses, caused severe headaches without noticeable improvement in his condition. A recent dual energy CT scan showed no evidence of gout or other crystal-related arthritis.   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   He mentions occasional soreness in his wrist and sciatic-type pain, but these are not as concerning as the ankle pain. No significant back pain. His left second toe remains stiff and somewhat swollen, described as 'sausage-like', and his right ankle is notably swollen on the lateral side. No pain in the Achilles tendon and the front of the ankle.    Recent labs were checked last month and reviewed:   06/24/23 ESR 23 Vit D 20.8 Lipids TChol 171 HDL 35 CBC wnl CMP wnl   Previous HPI 05/14/2023 Marco Reynolds is a 33 y.o. male here for follow up for right ankle pain and left toe pain with persistent joint swelling now just starting methotrexate  15 mg PO weekly and folic acid  1 mg daily. He has been on methotrexate  for over two months, supplemented with folic acid . They report no side effects from the medication, but also no noticeable improvement in their condition. The patient's blood work, conducted during a follow-up visit, appeared normal. They also underwent allergy  testing, which revealed sensitivities to cats, dogs, and certain trees. Resuming allergy  medication has improved their overall well-being, but has not impacted their joint pain.   The patient's pain is primarily located in their ankle, described as a throbbing sensation that intensifies with movement and can disrupt sleep. The pain typically subsides slightly after a few days of rest. The patient has tried to increase their physical activity, but this has resulted in increased pain. They have not been on  any other steroids since their last visit, although they did receive a steroid shot in their left second toe several months ago.   The patient has also noticed a change in their gait due to the pain, walking primarily on their heel to avoid putting weight on the affected ankle. This has led to additional discomfort in their knee, hip, and back. The patient has not observed any visible swelling or redness in the ankle, but reports that it feels puffy compared to the other ankle.   The patient has previously tried multiple treatments for their condition, including three steroid shots in the right ankle, which did not provide any relief. They express a desire to ensure the appropriateness of any further treatment escalation, given the lack of improvement with current therapies.       Previous HPI 03/10/2023 Marco Reynolds is a 33 y.o. male here for follow up for right ankle pain and left toe pain with persistent joint swelling now just starting methotrexate  15 mg PO weekly and folic acid  1 mg daily.  He was recommended to switch after visit with Dr. Curtis  in sports medicine clinic with persistent ankle swelling despite continuing sulfasalazine  treatment.  Seems like the dosing instructions were unclear he started taking this medicine 1 tablet daily over the weekend after stopping sulfasalazine .  He thinks this may be causing headaches otherwise has not noticed any difference.  Besides that in the interval he had a steroid injection in the left great toe for pain and swelling and this was very effective unlike the 3 injections he has tried in the right ankle.   Previous HPI 01/07/2023 Marco Reynolds is a 33 y.o. male here for follow up for right ankle pain and left toe pain with persistent joint swelling after trial of starting sulfasalazine  1000 mg twice daily.  Lab test at initial visit negative for any serologic marker antibody markers and negative for HLA-B27 but showing persistent  elevated CRP and sedimentation rate.  He started the sulfasalazine  500 mg twice daily for 1 week then 1000 mg twice daily now at about 1 month duration so far has not noticed any appreciable change in symptoms.  He followed up with Dr. Claudene started on allopurinol  which he is tolerating.  Was also started on venlafaxine  initially 37.5 mg and increase to 75 mg daily.  Despite all this still has symptoms pain is more with use and weightbearing versus direct pressure on the affected areas.  Sometimes gets a catch sensation in the right ankle that extends onto the medial side and bottom of the arch.  Persistent swelling at the left second toe is now worse at the base of the toe and less distally.   Previous HPI 11/25/22  Marco Reynolds is a 33 y.o. male here for evaluation of joint pain and swelling mostly in the right ankle but few other joints also involved.  This started since March 16 he woke up with acute onsets of significant pain and swelling.  Tried taking oral anti-inflammatory medications with limited benefit.  Saw Dr. Claudene had imaging trial of several steroid injections for concern of ankle inflammation or tendinopathy.  Went for MRI of the ankle in April demonstrated some chronic tendon changes also tibiotalar joint effusion.  In total to lateral and 1 medial steroid injection these were somewhat helpful and lateral ankle pain did improve but incomplete benefit.  During this time was also treated with a round of medication for bronchitis that resolved but did not see relief in joint pain with resolution.  More recently also started to have visible swelling and pain at the base of second and third toes on the left foot and a feeling like a nodule or mass pressing on the bottom of the foot while weightbearing.  Currently taking meloxicam  15 mg daily instead of over-the-counter NSAIDs which is partially controlling symptoms but still having a lot of activity and mobility limitations. Prior to onset  of the symptoms he did not recall any new injury or change in activity.  He was sick with uncomplicated course of COVID in January. He had a previous history of right knee and left ankle pain and effusions in 2020 found to have pseudogout confirmed on knee aspirate with intracellular calcium pyrophosphate crystals.  Also around that time did have 1 small kidney stone.  Also with hypercalcemia and hyperparathyroidism these are thought incidental to hide baseline serum protein level and mild vitamin D  insufficiency that resolved.      Review of Systems  Constitutional:  Negative for fatigue.  HENT:  Negative for mouth sores and mouth dryness.  Eyes:  Negative for dryness.  Respiratory:  Negative for shortness of breath.   Cardiovascular:  Negative for chest pain and palpitations.  Gastrointestinal:  Negative for blood in stool, constipation and diarrhea.  Endocrine: Negative for increased urination.  Genitourinary:  Negative for involuntary urination.  Musculoskeletal:  Positive for joint pain, joint pain and morning stiffness. Negative for gait problem, joint swelling, myalgias, muscle weakness, muscle tenderness and myalgias.  Skin:  Negative for color change, rash, hair loss and sensitivity to sunlight.  Allergic/Immunologic: Negative for susceptible to infections.  Neurological:  Negative for dizziness and headaches.  Hematological:  Negative for swollen glands.  Psychiatric/Behavioral:  Negative for depressed mood and sleep disturbance. The patient is not nervous/anxious.     PMFS History:  Patient Active Problem List   Diagnosis Date Noted   Rash and other nonspecific skin eruption 02/25/2024   Rheumatoid arthritis of multiple sites with negative rheumatoid factor (HCC) 12/03/2023   Prediabetes 07/08/2023   Vitamin D  deficiency 07/08/2023   Mixed hyperlipidemia 05/08/2023   History of pseudogout 01/07/2023   High risk medication use 11/25/2022   Obesity (BMI 30.0-34.9)  04/01/2018   Seasonal allergic rhinitis due to pollen 12/16/2016    Past Medical History:  Diagnosis Date   Allergy     Angio-edema    Effusion of knee joint right 07/17/2018   History of hyperlipidemia     Family History  Problem Relation Age of Onset   Allergic rhinitis Mother    Gestational diabetes Mother    Miscarriages / Stillbirths Mother    Allergic rhinitis Father    Hyperlipidemia Father    Hypertension Father    Kidney disease Father    Gout Father    Gout Brother    Healthy Brother    Healthy Daughter    Healthy Son    Past Surgical History:  Procedure Laterality Date   MENISCUS REPAIR Right    Social History   Social History Narrative   Not on file   Immunization History  Administered Date(s) Administered   DTaP 03/13/1989, 05/13/1989, 07/08/1992, 07/23/1992, 03/11/1994   HIB (PRP-OMP) 03/13/1989, 05/13/1989, 07/08/1989, 05/07/1990   Hepatitis A, Adult 09/09/2017   Hepatitis B 01/29/2001, 03/05/2001, 07/16/2001   Hepatitis B, ADULT 09/18/2017, 11/04/2017, 01/08/2018   IPV 03/13/1989, 05/13/1989, 07/23/1989, 03/11/1994   Influenza Inj Mdck Quad Pf 02/11/2022   Influenza Inj Mdck Quad With Preservative 02/07/2020, 02/05/2021, 02/11/2022   Influenza, Quadrivalent, Recombinant, Inj, Pf 01/28/2018   Influenza, Seasonal, Injecte, Preservative Fre 05/08/2023   Influenza,inj,Quad PF,6+ Mos 02/13/2017, 02/09/2019   Influenza-Unspecified 02/06/2016, 01/27/2018, 02/07/2020, 02/11/2022   MMR 05/07/1990, 03/11/1994   Meningococcal Mcv4o 09/09/2017   Moderna Sars-Covid-2 Vaccination 07/01/2019, 08/03/2019, 03/02/2020   Tdap 10/24/2006, 07/14/2014     Objective: Vital Signs: BP (!) 155/87   Pulse 90   Temp 98 F (36.7 C)   Resp 16   Ht 5' 10 (1.778 m)   Wt 251 lb 12.8 oz (114.2 kg)   BMI 36.13 kg/m    Physical Exam Constitutional:      Appearance: He is obese.  Eyes:     Conjunctiva/sclera: Conjunctivae normal.  Cardiovascular:     Rate and Rhythm:  Normal rate and regular rhythm.  Pulmonary:     Effort: Pulmonary effort is normal.     Breath sounds: Normal breath sounds.  Lymphadenopathy:     Cervical: No cervical adenopathy.  Skin:    General: Skin is warm and dry.     Comments: There is some  skin flushing type erythema present at the face and upper chest and bilateral hands, blanching, no overlying papules no telangiectasias no nail changes  Neurological:     Mental Status: He is alert.  Psychiatric:        Mood and Affect: Mood normal.      Musculoskeletal Exam:  Shoulders full ROM no tenderness or swelling Elbows full ROM no tenderness or swelling Wrists full ROM no tenderness or swelling Fingers full ROM no tenderness or swelling Hip normal internal and external rotation without pain, no tenderness to lateral hip palpation Knees full ROM no tenderness or swelling Right ankle mildly restricted eversion, with pain worse at and distal to medial malleolus    Investigation: No additional findings.  Imaging: No results found.  Recent Labs: Lab Results  Component Value Date   WBC 4.4 12/03/2023   HGB 15.1 12/03/2023   PLT 241 12/03/2023   NA 137 12/03/2023   K 4.4 12/03/2023   CL 99 12/03/2023   CO2 20 12/03/2023   GLUCOSE 89 12/03/2023   BUN 16 12/03/2023   CREATININE 0.93 12/03/2023   BILITOT 0.5 12/03/2023   ALKPHOS 55 12/03/2023   AST 29 12/03/2023   ALT 30 12/03/2023   PROT 7.4 12/03/2023   ALBUMIN 5.0 12/03/2023   CALCIUM 9.5 12/03/2023   GFRAA 101 04/01/2018   QFTBGOLDPLUS NEGATIVE 07/15/2023    Speciality Comments: Would like a supply on Amjevita  when possible.  Procedures:  No procedures performed Allergies: Patient has no known allergies.   Assessment / Plan:     Visit Diagnoses: Rheumatoid arthritis of multiple sites with negative rheumatoid factor (HCC) - Plan: Sedimentation rate, Anti-DNA antibody, double-stranded Persistent symptoms in ankles and knuckles with morning stiffness  and swelling. Knuckles slightly worse but not significantly limiting activities. Skin redness likely due to increased blood flow. Low probability of anti-drug antibodies. - Continue current Amjevita  40 mg subcu q. 14 days - Monitor skin redness, report if worsens or persists. - Consider anti-drug antibody testing if symptoms worsen or new side effects develop. - Order regular labs to monitor condition. - Consider adding 10 mg Zyrtec at night for skin redness.   High risk medication use - Amjevita  40mg  subcut every 14 days - Plan: CBC with Differential/Platelet, Comprehensive metabolic panel with GFR Rash and other nonspecific skin eruption Skin changes are nonspecific.  I do not think these indicate a true drug eruption the timing would be somewhat random and also there are no more persistent deeply inflammatory changes that would be expected in the setting of antibody mediated response. - Checking CBC CMP medication monitoring now on Amjevita  injection - Checking dsDNA ruling out biologic induced drug reaction  Orders: Orders Placed This Encounter  Procedures   Sedimentation rate   CBC with Differential/Platelet   Comprehensive metabolic panel with GFR   Anti-DNA antibody, double-stranded   No orders of the defined types were placed in this encounter.    Follow-Up Instructions: Return in about 3 months (around 05/27/2024) for RA on ADA f/u 3mos.   Lonni LELON Ester, MD  Note - This record has been created using [employer].  Chart creation errors have been sought, but may not always  have been located. Such creation errors do not reflect on  the standard of medical care.

## 2024-02-17 ENCOUNTER — Other Ambulatory Visit: Payer: Self-pay | Admitting: Internal Medicine

## 2024-02-17 DIAGNOSIS — M0600 Rheumatoid arthritis without rheumatoid factor, unspecified site: Secondary | ICD-10-CM

## 2024-02-17 NOTE — Telephone Encounter (Signed)
 Last Fill: 11/25/2023  Labs: 12/03/2023 CMP WNL, 11/25/2023 CBC WNL  TB Gold: 07/15/2023 Neg    Next Visit: 02/25/2024  Last Visit: 11/25/2023  IK:Dzmnwzhjupcz rheumatoid arthritis   Current Dose per office note 11/25/2023: Amjevita  40mg  subcut every 14 days   Patient to update labs at upcoming appointment on 11/25/2023  Okay to refill Amjevita ?

## 2024-02-25 ENCOUNTER — Encounter: Payer: Self-pay | Admitting: Internal Medicine

## 2024-02-25 ENCOUNTER — Ambulatory Visit: Attending: Internal Medicine | Admitting: Internal Medicine

## 2024-02-25 VITALS — BP 143/79 | HR 82 | Temp 98.0°F | Resp 16 | Ht 70.0 in | Wt 251.8 lb

## 2024-02-25 DIAGNOSIS — M0609 Rheumatoid arthritis without rheumatoid factor, multiple sites: Secondary | ICD-10-CM

## 2024-02-25 DIAGNOSIS — M0600 Rheumatoid arthritis without rheumatoid factor, unspecified site: Secondary | ICD-10-CM

## 2024-02-25 DIAGNOSIS — R21 Rash and other nonspecific skin eruption: Secondary | ICD-10-CM

## 2024-02-25 DIAGNOSIS — Z79899 Other long term (current) drug therapy: Secondary | ICD-10-CM | POA: Diagnosis not present

## 2024-02-25 NOTE — Patient Instructions (Signed)
 Rash could be drug related but is nonspecific. Could also be flaring up of atopic dermatitis/eczema type of process. I recommend a trial of antihistamine, adding zyrte (cetirizine) 10 mg at night to gauge if there is an impact on rashes. We could do a more extensive workup next time if worsening/not improving.

## 2024-02-26 LAB — CBC WITH DIFFERENTIAL/PLATELET
Absolute Lymphocytes: 3352 {cells}/uL (ref 850–3900)
Absolute Monocytes: 571 {cells}/uL (ref 200–950)
Basophils Absolute: 34 {cells}/uL (ref 0–200)
Basophils Relative: 0.4 %
Eosinophils Absolute: 59 {cells}/uL (ref 15–500)
Eosinophils Relative: 0.7 %
HCT: 51.8 % — ABNORMAL HIGH (ref 38.5–50.0)
Hemoglobin: 17.6 g/dL — ABNORMAL HIGH (ref 13.2–17.1)
MCH: 29.7 pg (ref 27.0–33.0)
MCHC: 34 g/dL (ref 32.0–36.0)
MCV: 87.5 fL (ref 80.0–100.0)
MPV: 11.3 fL (ref 7.5–12.5)
Monocytes Relative: 6.8 %
Neutro Abs: 4385 {cells}/uL (ref 1500–7800)
Neutrophils Relative %: 52.2 %
Platelets: 277 Thousand/uL (ref 140–400)
RBC: 5.92 Million/uL — ABNORMAL HIGH (ref 4.20–5.80)
RDW: 13.1 % (ref 11.0–15.0)
Total Lymphocyte: 39.9 %
WBC: 8.4 Thousand/uL (ref 3.8–10.8)

## 2024-02-26 LAB — ANTI-DNA ANTIBODY, DOUBLE-STRANDED: ds DNA Ab: 1 [IU]/mL

## 2024-02-26 LAB — COMPREHENSIVE METABOLIC PANEL WITH GFR
AG Ratio: 1.9 (calc) (ref 1.0–2.5)
ALT: 19 U/L (ref 9–46)
AST: 22 U/L (ref 10–40)
Albumin: 5.2 g/dL — ABNORMAL HIGH (ref 3.6–5.1)
Alkaline phosphatase (APISO): 56 U/L (ref 36–130)
BUN: 9 mg/dL (ref 7–25)
CO2: 29 mmol/L (ref 20–32)
Calcium: 9.9 mg/dL (ref 8.6–10.3)
Chloride: 98 mmol/L (ref 98–110)
Creat: 1 mg/dL (ref 0.60–1.26)
Globulin: 2.7 g/dL (ref 1.9–3.7)
Glucose, Bld: 85 mg/dL (ref 65–99)
Potassium: 4.1 mmol/L (ref 3.5–5.3)
Sodium: 137 mmol/L (ref 135–146)
Total Bilirubin: 0.7 mg/dL (ref 0.2–1.2)
Total Protein: 7.9 g/dL (ref 6.1–8.1)
eGFR: 101 mL/min/1.73m2 (ref >=60)

## 2024-02-26 LAB — SEDIMENTATION RATE: Sed Rate: 9 mm/h (ref 0–15)

## 2024-03-31 ENCOUNTER — Ambulatory Visit: Admitting: Dermatology

## 2024-04-05 ENCOUNTER — Encounter: Payer: Self-pay | Admitting: Internal Medicine

## 2024-04-26 ENCOUNTER — Other Ambulatory Visit: Payer: Self-pay | Admitting: Internal Medicine

## 2024-04-26 DIAGNOSIS — M0600 Rheumatoid arthritis without rheumatoid factor, unspecified site: Secondary | ICD-10-CM

## 2024-04-26 NOTE — Telephone Encounter (Signed)
 Last Fill: 02/17/2024   Labs: 02/25/2024 albumin 5.2, RBC 5.92, hemoglobin 17.6, HCT 51.8  TB Gold: 07/15/2023 negative    Next Visit: 05/26/2024  Last Visit: 02/25/2024  IK:Myzlfjunpi arthritis of multiple sites with negative rheumatoid factor   Current Dose per office note on 02/25/2024: Amjevita  40mg  subcut every 14 days   Okay to refill Amjevita ?

## 2024-05-03 ENCOUNTER — Encounter: Payer: Self-pay | Admitting: Dermatology

## 2024-05-03 ENCOUNTER — Ambulatory Visit (INDEPENDENT_AMBULATORY_CARE_PROVIDER_SITE_OTHER): Admitting: Dermatology

## 2024-05-03 DIAGNOSIS — D229 Melanocytic nevi, unspecified: Secondary | ICD-10-CM

## 2024-05-03 DIAGNOSIS — Z1283 Encounter for screening for malignant neoplasm of skin: Secondary | ICD-10-CM | POA: Diagnosis not present

## 2024-05-03 DIAGNOSIS — D1801 Hemangioma of skin and subcutaneous tissue: Secondary | ICD-10-CM | POA: Diagnosis not present

## 2024-05-03 DIAGNOSIS — L578 Other skin changes due to chronic exposure to nonionizing radiation: Secondary | ICD-10-CM | POA: Diagnosis not present

## 2024-05-03 DIAGNOSIS — W908XXA Exposure to other nonionizing radiation, initial encounter: Secondary | ICD-10-CM | POA: Diagnosis not present

## 2024-05-03 DIAGNOSIS — L814 Other melanin hyperpigmentation: Secondary | ICD-10-CM | POA: Diagnosis not present

## 2024-05-03 DIAGNOSIS — D2371 Other benign neoplasm of skin of right lower limb, including hip: Secondary | ICD-10-CM | POA: Diagnosis not present

## 2024-05-03 DIAGNOSIS — L821 Other seborrheic keratosis: Secondary | ICD-10-CM

## 2024-05-03 NOTE — Patient Instructions (Addendum)

## 2024-05-03 NOTE — Progress Notes (Signed)
" ° °  New Patient Visit   Subjective  Marco Reynolds is a 34 y.o. male NEW PATIENT who presents for the following:   Total Body Skin Exam (TBSE)  The patient reports he has spots, moles and lesions to be evaluated, some may be new or changing and the patient may have concern these could be cancer. Patient has previously been treated by a dermatologist. Denied Hx of Bx. Denied family Hx of skin cancers. Patient does apply sunscreen and/or wears protective coverings. Pt stated that he is on Amjevita  injections Rx by his rheumatologist for RA that requires yearly skin checks.   The following portions of the chart were reviewed this encounter and updated as appropriate: medications, allergies, medical history  Review of Systems:  No other skin or systemic complaints except as noted in HPI or Assessment and Plan.  Objective  Well appearing patient in no apparent distress; mood and affect are within normal limits.  A full examination was performed including scalp, head, eyes, ears, nose, lips, neck, chest, axillae, abdomen, back, buttocks, bilateral upper extremities, bilateral lower extremities, hands, feet, fingers, toes, fingernails, and toenails. All findings within normal limits unless otherwise noted below.    Relevant exam findings are noted in the Assessment and Plan.   Assessment & Plan   LENTIGINES, SEBORRHEIC KERATOSES, HEMANGIOMAS - Benign normal skin lesions - Benign-appearing - Call for any changes  BENIGN MELANOCYTIC NEVI - Tan-brown and/or pink-flesh-colored symmetric macules and papules - Benign appearing on exam today - Observation - Call clinic for new or changing moles - Recommend daily use of broad spectrum spf 30+ sunscreen to sun-exposed areas.   MILD ACTINIC DAMAGE - Chronic condition, secondary to cumulative UV/sun exposure - diffuse scaly erythematous macules with underlying dyspigmentation - Recommend daily broad spectrum sunscreen SPF 30+ to  sun-exposed areas, reapply every 2 hours as needed.  - Staying in the shade or wearing long sleeves, sun glasses (UVA+UVB protection) and wide brim hats (4-inch brim around the entire circumference of the hat) are also recommended for sun protection.  - Call for new or changing lesions.  DERMATOFIBROMA Exam: Firm pink/brown papulenodule with dimple sign at R thigh  Treatment Plan: A dermatofibroma is a benign growth possibly related to trauma, such as an insect bite, cut from shaving, or inflamed acne-type bump.  Treatment options to remove include shave or excision with resulting scar and risk of recurrence.  Since benign-appearing and not bothersome, will observe for now.   SKIN CANCER SCREENING PERFORMED TODAY   No follow-ups on file.   Documentation: I have reviewed the above documentation for accuracy and completeness, and I agree with the above.  I, Shirron Maranda, CMA II, am acting as scribe for:  Delon Lenis, DO "

## 2024-05-12 ENCOUNTER — Encounter: Payer: Self-pay | Admitting: Family Medicine

## 2024-05-12 ENCOUNTER — Ambulatory Visit

## 2024-05-12 NOTE — Assessment & Plan Note (Deleted)
 Marco Reynolds

## 2024-05-12 NOTE — Assessment & Plan Note (Addendum)
 Symptoms stable with no significant changes. Inflammation markers not correlating with symptoms. Off Amjevita  since November, likely cleared from system. He should be at lower risk of severe flare since seronegative and without a complication after over 2 moths already off treatment. Prefers minimal medication use. - Monitor symptoms without medication. - Follow-up in six months. - Contact clinic if symptoms worsen or increase in pain, swelling, or stiffness.

## 2024-05-26 ENCOUNTER — Encounter: Payer: Self-pay | Admitting: Internal Medicine

## 2024-05-26 ENCOUNTER — Ambulatory Visit: Attending: Internal Medicine | Admitting: Internal Medicine

## 2024-05-26 VITALS — BP 139/77 | HR 91 | Temp 97.8°F | Resp 16 | Ht 70.0 in | Wt 254.0 lb

## 2024-05-26 DIAGNOSIS — M0609 Rheumatoid arthritis without rheumatoid factor, multiple sites: Secondary | ICD-10-CM | POA: Diagnosis not present

## 2024-05-26 DIAGNOSIS — Z79899 Other long term (current) drug therapy: Secondary | ICD-10-CM

## 2024-05-26 DIAGNOSIS — R21 Rash and other nonspecific skin eruption: Secondary | ICD-10-CM | POA: Diagnosis not present

## 2024-05-26 NOTE — Assessment & Plan Note (Addendum)
 Symptoms improved overall. Suspect current problems most related to atopic dermatitis given seasonal/environmental allergy  symptoms elsewhere.

## 2024-06-07 ENCOUNTER — Ambulatory Visit: Admitting: Family

## 2024-06-25 ENCOUNTER — Ambulatory Visit: Admitting: Family Medicine

## 2024-11-23 ENCOUNTER — Ambulatory Visit: Admitting: Internal Medicine
